# Patient Record
Sex: Female | Born: 1940 | Race: Black or African American | Hispanic: No | Smoking: Never smoker
Health system: Southern US, Community
[De-identification: ages and names within clinical notes are randomized; demographics above are authoritative.]

## PROBLEM LIST (undated history)

## (undated) DIAGNOSIS — E119 Type 2 diabetes mellitus without complications: Secondary | ICD-10-CM

## (undated) DIAGNOSIS — K5792 Diverticulitis of intestine, part unspecified, without perforation or abscess without bleeding: Secondary | ICD-10-CM

## (undated) DIAGNOSIS — I10 Essential (primary) hypertension: Secondary | ICD-10-CM

## (undated) DIAGNOSIS — M1990 Unspecified osteoarthritis, unspecified site: Secondary | ICD-10-CM

## (undated) DIAGNOSIS — F0390 Unspecified dementia without behavioral disturbance: Secondary | ICD-10-CM

## (undated) HISTORY — PX: COLON RESECTION: SHX5231

---

## 1999-03-10 ENCOUNTER — Encounter: Payer: Self-pay | Admitting: *Deleted

## 1999-03-10 ENCOUNTER — Inpatient Hospital Stay (HOSPITAL_COMMUNITY): Admission: EM | Admit: 1999-03-10 | Discharge: 1999-03-13 | Payer: Self-pay | Admitting: Emergency Medicine

## 1999-04-18 ENCOUNTER — Encounter: Payer: Self-pay | Admitting: *Deleted

## 1999-04-18 ENCOUNTER — Inpatient Hospital Stay (HOSPITAL_COMMUNITY): Admission: RE | Admit: 1999-04-18 | Discharge: 1999-04-20 | Payer: Self-pay | Admitting: *Deleted

## 1999-07-20 ENCOUNTER — Encounter: Payer: Self-pay | Admitting: *Deleted

## 1999-07-20 ENCOUNTER — Encounter: Payer: Self-pay | Admitting: Emergency Medicine

## 1999-07-20 ENCOUNTER — Inpatient Hospital Stay (HOSPITAL_COMMUNITY): Admission: EM | Admit: 1999-07-20 | Discharge: 1999-07-22 | Payer: Self-pay | Admitting: Emergency Medicine

## 1999-08-15 ENCOUNTER — Ambulatory Visit (HOSPITAL_COMMUNITY): Admission: RE | Admit: 1999-08-15 | Discharge: 1999-08-15 | Payer: Self-pay | Admitting: General Surgery

## 1999-08-15 ENCOUNTER — Encounter: Payer: Self-pay | Admitting: General Surgery

## 1999-08-22 ENCOUNTER — Encounter: Payer: Self-pay | Admitting: General Surgery

## 1999-08-25 ENCOUNTER — Inpatient Hospital Stay (HOSPITAL_COMMUNITY): Admission: RE | Admit: 1999-08-25 | Discharge: 1999-08-31 | Payer: Self-pay | Admitting: General Surgery

## 1999-08-25 ENCOUNTER — Encounter (INDEPENDENT_AMBULATORY_CARE_PROVIDER_SITE_OTHER): Payer: Self-pay | Admitting: Specialist

## 2000-02-16 ENCOUNTER — Other Ambulatory Visit: Admission: RE | Admit: 2000-02-16 | Discharge: 2000-02-16 | Payer: Self-pay | Admitting: Internal Medicine

## 2000-04-12 ENCOUNTER — Ambulatory Visit (HOSPITAL_COMMUNITY): Admission: RE | Admit: 2000-04-12 | Discharge: 2000-04-12 | Payer: Self-pay | Admitting: Ophthalmology

## 2000-07-24 ENCOUNTER — Encounter: Admission: RE | Admit: 2000-07-24 | Discharge: 2000-07-24 | Payer: Self-pay | Admitting: Internal Medicine

## 2000-07-24 ENCOUNTER — Encounter: Payer: Self-pay | Admitting: Internal Medicine

## 2000-08-20 ENCOUNTER — Encounter: Payer: Self-pay | Admitting: Internal Medicine

## 2000-08-20 ENCOUNTER — Ambulatory Visit (HOSPITAL_COMMUNITY): Admission: RE | Admit: 2000-08-20 | Discharge: 2000-08-20 | Payer: Self-pay | Admitting: Internal Medicine

## 2001-07-19 ENCOUNTER — Emergency Department (HOSPITAL_COMMUNITY): Admission: EM | Admit: 2001-07-19 | Discharge: 2001-07-20 | Payer: Self-pay | Admitting: Emergency Medicine

## 2005-04-26 ENCOUNTER — Emergency Department (HOSPITAL_COMMUNITY): Admission: EM | Admit: 2005-04-26 | Discharge: 2005-04-26 | Payer: Self-pay | Admitting: Emergency Medicine

## 2007-07-11 ENCOUNTER — Emergency Department (HOSPITAL_COMMUNITY): Admission: EM | Admit: 2007-07-11 | Discharge: 2007-07-11 | Payer: Self-pay | Admitting: Family Medicine

## 2009-11-29 ENCOUNTER — Encounter (INDEPENDENT_AMBULATORY_CARE_PROVIDER_SITE_OTHER): Payer: Self-pay | Admitting: *Deleted

## 2009-12-20 ENCOUNTER — Encounter (INDEPENDENT_AMBULATORY_CARE_PROVIDER_SITE_OTHER): Payer: Self-pay | Admitting: *Deleted

## 2009-12-21 ENCOUNTER — Encounter (INDEPENDENT_AMBULATORY_CARE_PROVIDER_SITE_OTHER): Payer: Self-pay | Admitting: *Deleted

## 2009-12-21 ENCOUNTER — Ambulatory Visit: Payer: Self-pay | Admitting: Internal Medicine

## 2010-01-04 ENCOUNTER — Ambulatory Visit: Payer: Self-pay | Admitting: Internal Medicine

## 2010-11-28 NOTE — Letter (Signed)
Summary: Previsit letter  [facility]  [address] [place], [place] [address]   Phone: [phone]  Fax: [phone]       11/29/2009 MRN: [id number]  Tammy Huff [address] [place], [place]  [address]  Dear [name],  Welcome to the Gastroenterology Division at [facility].    You are scheduled to see a nurse for your pre-procedure visit on 12-21-09 at 10:00a.m. on the 3rd floor at [facility], [address]  We ask that you try to arrive at our office 15 minutes prior to your appointment time to allow for check-in.  Your nurse visit will consist of discussing your medical and surgical history, your immediate family medical history, and your medications.    Please bring a complete list of all your medications or, if you prefer, bring the medication bottles and we will list them.  We will need to be aware of both prescribed and over the counter drugs.  We will need to know exact dosage information as well.  If you are on blood thinners (Coumadin, Plavix, Aggrenox, Ticlid, etc.) please call our office today/prior to your appointment, as we need to consult with your physician about holding your medication.   Please be prepared to read and sign documents such as consent forms, a financial agreement, and acknowledgement forms.  If necessary, and with your consent, a friend or relative is welcome to sit-in on the nurse visit with you.  Please bring your insurance card so that we may make a copy of it.  If your insurance requires a referral to see a specialist, please bring your referral form from your primary care physician.  No co-pay is required for this nurse visit.     If you cannot keep your appointment, please call [phone] to cancel or reschedule prior to your appointment date.  This allows [place] the opportunity to schedule an appointment for another patient in need of care.    Thank you for choosing [facility] for your medical  needs.  We appreciate the opportunity to care for you.  Please visit [place] at our website  to learn more about our practice.                     Sincerely.                                                                                                                   The Gastroenterology Division

## 2010-11-28 NOTE — Procedures (Signed)
Summary: Flexible Sigmoidoscopy  Patient: Tammy Huff Note: All result statuses are Final unless otherwise noted.  Tests: (1) Flexible Sigmoidoscopy (FLX)  FLX Flexible Sigmoidoscopy                             DONE     [facility]     [address]     [place], [place]  [address]           FLEXIBLE SIGMOIDOSCOPY PROCEDURE REPORT           PATIENT:  Tammy Huff, Tammy Huff  MR#:  [id number]     BIRTHDATE:  [birth year], 68 yrs. old  GENDER:  female           ENDOSCOPIST:  Iva Boop, MD, [facility]     Referred by:  Guerry Bruin, M.D.           PROCEDURE DATE:  01/04/2010     PROCEDURE:  Flexible Sigmoidoscopy, diagnostic     ASA CLASS:  Class II     INDICATIONS:  screening routine risk     (prior subtotal colecomy for diverticulitis)           MEDICATIONS:   Fentanyl 50 mcg IV, Versed 5 mg IV           DESCRIPTION OF PROCEDURE:   After the risks benefits and     alternatives of the procedure were thoroughly explained, informed     consent was obtained.  Digital rectal exam was performed and     revealed no abnormalities.   The LB [id number] endoscope     was introduced through the anus and advanced to the ileum, without     limitations.  The quality of the prep was excellent.  The     instrument was then slowly withdrawn as the mucosa was fully     examined.     <<PROCEDUREIMAGES>>           A postop change was noted. 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  A diverticulum was found in     the sigmoid colon.  The examination was otherwise normal in the     sigmoid colon.  Internal hemorrhoids were found in the rectum.     Retroflexed views in the rectum revealed small internal hemorrhoids     also.    The scope was then withdrawn from the patient and the     procedure terminated.           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 Could be     performed without sedation           Iva Boop, MD, [facility]           CC:  Rich Brave, MD     The Patient           n.     eSIGNED:   Iva Boop at 01/04/2010 09:20 AM  Adea, Ge[place], [phone]  Note: An exclamation mark (!) indicates a result that was not dispersed into the flowsheet. Document Creation Date: 01/04/2010 9:20 AM _______________________________________________________________________  (1) Order result status: Final Collection or observation date-time: 01/04/2010 09:06 Requested date-time:  Receipt date-time:  Reported date-time:  Referring Physician:   Ordering Physician: Stan Head [phone]) Specimen Source:  Source: Launa Grill Order Number: [id number] Lab site:   Appended Document: Flexible Sigmoidoscopy     Procedures Next Due Date:    Flexible Sigmoidoscopy: 12/2019

## 2010-11-28 NOTE — Letter (Signed)
Summary: Diabetic Instructions  [facility]  [address] [place], [place] [address]   Phone: [phone]  Fax: [phone]    Tammy Huff 1941/04/30 MRN: [id number]    ________________________________________________________________________  _ x _   INSULIN (LONG ACTING) MEDICATION INSTRUCTIONS (Lantus, NPH, 70/30, Humulin, Novolin-N)   The day before your procedure:   Take your regular morning dose    The day of your procedure:   Do not take your morning dose

## 2010-11-28 NOTE — Letter (Signed)
Summary: [facility] Instructions  [facility]  [address] [place], [place] [address]   Phone: [phone]  Fax: [phone]       Tammy Huff    02-Jul-1941    MRN: [id number]        Procedure Day Dorna Bloom:  Wednesday  01/04/10     Arrival Time: 7:30am     Procedure Time:  8:30am     Location of Procedure:                    _X _  [facility] (4th Floor)                        PREPARATION FOR COLONOSCOPY WITH MOVIPREP   Starting 5 days prior to your procedure   Friday 03/04  do not eat nuts, seeds, popcorn, corn, beans, peas,  salads, or any raw vegetables.  Do not take any fiber supplements (e.g. Metamucil, Citrucel, and Benefiber).  THE DAY BEFORE YOUR PROCEDURE         DATE:  03/08   DAY: Tuesday  1.  Drink clear liquids the entire day-NO SOLID FOOD  2.  Do not drink anything colored red or purple.  Avoid juices with pulp.  No orange juice.  3.  Drink at least 64 oz. (8 glasses) of fluid/clear liquids during the day to prevent dehydration and help the prep work efficiently.  CLEAR LIQUIDS INCLUDE: Water Jello Ice Popsicles Tea (sugar ok, no milk/cream) Powdered fruit flavored drinks Coffee (sugar ok, no milk/cream) Gatorade Juice: apple, white grape, white cranberry  Lemonade Clear bullion, consomm, broth Carbonated beverages (any kind) Strained chicken noodle soup Hard Candy                             4.  In the morning, mix first dose of MoviPrep solution:    Empty 1 Pouch A and 1 Pouch B into the disposable container    Add lukewarm drinking water to the top line of the container. Mix to dissolve    Refrigerate (mixed solution should be used within 24 hrs)  5.  Begin drinking the prep at 5:00 p.m. The MoviPrep container is divided by 4 marks.   Every 15 minutes drink the solution down to the next mark (approximately 8 oz) until the full liter is complete.   6.  Follow completed prep with 16 oz of clear liquid of your  choice (Nothing red or purple).  Continue to drink clear liquids until bedtime.  7.  Before going to bed, mix second dose of MoviPrep solution:    Empty 1 Pouch A and 1 Pouch B into the disposable container    Add lukewarm drinking water to the top line of the container. Mix to dissolve    Refrigerate  THE DAY OF YOUR PROCEDURE      DATE:  03/09  DAY: Wednesday  Beginning at  3:30 a.m. (5 hours before procedure):         1. Every 15 minutes, drink the solution down to the next mark (approx 8 oz) until the full liter is complete.  2. Follow completed prep with 16 oz. of clear liquid of your choice.    3. You may drink clear liquids until 6:30am  (2 HOURS BEFORE PROCEDURE).   MEDICATION INSTRUCTIONS  Unless otherwise instructed, you should take regular prescription medications with a small sip of  water   as early as possible the morning of your procedure.  Diabetic patients - see separate instructions.   Additional medication instructions: Take morning blood pressure medicine the day of procedure. Hold HCTZ and insulin the morning of procedure.         OTHER INSTRUCTIONS  You will need a responsible adult at least 72 years of age to accompany you and drive you home.   This person must remain in the waiting room during your procedure.  Wear loose fitting clothing that is easily removed.  Leave jewelry and other valuables at home.  However, you may wish to bring a book to read or  an iPod/MP3 player to listen to music as you wait for your procedure to start.  Remove all body piercing jewelry and leave at home.  Total time from sign-in until discharge is approximately 2-3 hours.  You should go home directly after your procedure and rest.  You can resume normal activities the  day after your procedure.  The day of your procedure you should not:   Drive   Make legal decisions   Operate machinery   Drink alcohol   Return to work  You will receive specific  instructions about eating, activities and medications before you leave.    The above instructions have been reviewed and explained to me by   Wyona Almas RN  December 21, 2009 10:15 AM     I fully understand and can verbalize these instructions _____________________________ Date _________

## 2010-11-28 NOTE — Miscellaneous (Signed)
Summary: [facility] Previsit/prep  Clinical Lists Changes  Medications: Added new medication of MOVIPREP 100 GM  SOLR (PEG-KCL-NACL-NASULF-NA ASC-C) As per prep instructions. - Signed Rx of MOVIPREP 100 GM  SOLR (PEG-KCL-NACL-NASULF-NA ASC-C) As per prep instructions.;  #1 x 0;  Signed;  Entered by: Wyona Almas RN;  Authorized by: Iva Boop MD, [facility];  Method used: Electronically to [facility] [phone]*, [address] [place], [place]  [address], Ph: [phone], Fax: [phone] Observations: Added new observation of NKA: T (12/21/2009 9:27)    Prescriptions: MOVIPREP 100 GM  SOLR (PEG-KCL-NACL-NASULF-NA ASC-C) As per prep instructions.  #1 x 0   Entered by:   Wyona Almas RN   Authorized by:   Iva Boop MD, [facility]   Signed by:   Wyona Almas RN on 12/21/2009   Method used:   Electronically to        [facility] [address] [phone]* (retail)       [address]       [address]       [place], [place]  [address]       Ph: [phone]       Fax: [phone]   RxID:   [id number]

## 2011-01-19 LAB — GLUCOSE, CAPILLARY
Glucose-Capillary: 144 mg/dL — ABNORMAL HIGH (ref 70–99)
Glucose-Capillary: 62 mg/dL — ABNORMAL LOW (ref 70–99)
Glucose-Capillary: 89 mg/dL (ref 70–99)

## 2011-01-22 ENCOUNTER — Emergency Department (HOSPITAL_COMMUNITY): Payer: Medicare (Managed Care)

## 2011-01-22 ENCOUNTER — Observation Stay (HOSPITAL_COMMUNITY)
Admission: EM | Admit: 2011-01-22 | Discharge: 2011-01-23 | Disposition: A | Discharge status: Home / Self Care | Payer: Medicare (Managed Care) | Source: Ambulatory Visit | Attending: Emergency Medicine | Admitting: Emergency Medicine

## 2011-01-22 DIAGNOSIS — I10 Essential (primary) hypertension: Secondary | ICD-10-CM | POA: Insufficient documentation

## 2011-01-22 DIAGNOSIS — R4182 Altered mental status, unspecified: Secondary | ICD-10-CM | POA: Insufficient documentation

## 2011-01-22 DIAGNOSIS — Z9181 History of falling: Secondary | ICD-10-CM | POA: Insufficient documentation

## 2011-01-22 DIAGNOSIS — E1169 Type 2 diabetes mellitus with other specified complication: Principal | ICD-10-CM | POA: Insufficient documentation

## 2011-01-22 LAB — COMPREHENSIVE METABOLIC PANEL
Alkaline Phosphatase: 101 U/L (ref 39–117)
BUN: 15 mg/dL (ref 6–23)
CO2: 28 mEq/L (ref 19–32)
Calcium: 9 mg/dL (ref 8.4–10.5)
Chloride: 106 mEq/L (ref 96–112)
GFR calc Af Amer: 35 mL/min — ABNORMAL LOW (ref >60)
GFR calc non Af Amer: 29 mL/min — ABNORMAL LOW (ref >60)
Glucose, Bld: 75 mg/dL (ref 70–99)
Total Protein: 8.3 g/dL (ref 6.0–8.3)

## 2011-01-22 LAB — POCT I-STAT, CHEM 8
Chloride: 105 mEq/L (ref 96–112)
HCT: 35 % — ABNORMAL LOW (ref 36.0–46.0)
Hemoglobin: 11.9 g/dL — ABNORMAL LOW (ref 12.0–15.0)
Sodium: 143 mEq/L (ref 135–145)

## 2011-01-22 LAB — URINALYSIS, ROUTINE W REFLEX MICROSCOPIC
Glucose, UA: NEGATIVE mg/dL
Hgb urine dipstick: NEGATIVE
Protein, ur: NEGATIVE mg/dL
Specific Gravity, Urine: 1.014 (ref 1.005–1.030)
Urobilinogen, UA: 0.2 mg/dL (ref 0.0–1.0)
pH: 5.5 (ref 5.0–8.0)

## 2011-01-22 LAB — CBC
Platelets: 282 10*3/uL (ref 150–400)
RDW: 14.8 % (ref 11.5–15.5)
WBC: 8.1 10*3/uL (ref 4.0–10.5)

## 2011-01-22 LAB — GLUCOSE, CAPILLARY: Glucose-Capillary: 138 mg/dL — ABNORMAL HIGH (ref 70–99)

## 2011-01-22 LAB — DIFFERENTIAL
Basophils Absolute: 0 10*3/uL (ref 0.0–0.1)
Basophils Relative: 1 % (ref 0–1)
Eosinophils Absolute: 0.1 10*3/uL (ref 0.0–0.7)
Lymphs Abs: 2.2 10*3/uL (ref 0.7–4.0)
Monocytes Absolute: 0.8 10*3/uL (ref 0.1–1.0)

## 2011-01-23 LAB — GLUCOSE, CAPILLARY: Glucose-Capillary: 127 mg/dL — ABNORMAL HIGH (ref 70–99)

## 2011-10-24 ENCOUNTER — Ambulatory Visit: Payer: Self-pay

## 2011-10-27 ENCOUNTER — Ambulatory Visit: Payer: Self-pay

## 2012-04-17 ENCOUNTER — Encounter (INDEPENDENT_AMBULATORY_CARE_PROVIDER_SITE_OTHER): Payer: Medicare Other | Admitting: Ophthalmology

## 2012-04-17 DIAGNOSIS — I10 Essential (primary) hypertension: Secondary | ICD-10-CM

## 2012-04-17 DIAGNOSIS — H3530 Unspecified macular degeneration: Secondary | ICD-10-CM

## 2012-04-17 DIAGNOSIS — E11359 Type 2 diabetes mellitus with proliferative diabetic retinopathy without macular edema: Secondary | ICD-10-CM

## 2012-04-17 DIAGNOSIS — E11319 Type 2 diabetes mellitus with unspecified diabetic retinopathy without macular edema: Secondary | ICD-10-CM

## 2012-04-17 DIAGNOSIS — E1165 Type 2 diabetes mellitus with hyperglycemia: Secondary | ICD-10-CM

## 2012-04-17 DIAGNOSIS — H43819 Vitreous degeneration, unspecified eye: Secondary | ICD-10-CM

## 2012-04-17 DIAGNOSIS — H35379 Puckering of macula, unspecified eye: Secondary | ICD-10-CM

## 2012-04-25 ENCOUNTER — Encounter (INDEPENDENT_AMBULATORY_CARE_PROVIDER_SITE_OTHER): Payer: Medicare Other | Admitting: Ophthalmology

## 2012-04-25 DIAGNOSIS — E1139 Type 2 diabetes mellitus with other diabetic ophthalmic complication: Secondary | ICD-10-CM

## 2012-04-25 DIAGNOSIS — E11359 Type 2 diabetes mellitus with proliferative diabetic retinopathy without macular edema: Secondary | ICD-10-CM

## 2012-08-25 ENCOUNTER — Ambulatory Visit (INDEPENDENT_AMBULATORY_CARE_PROVIDER_SITE_OTHER): Payer: Medicare Other | Admitting: Ophthalmology

## 2012-08-25 DIAGNOSIS — E1139 Type 2 diabetes mellitus with other diabetic ophthalmic complication: Secondary | ICD-10-CM

## 2012-08-25 DIAGNOSIS — I10 Essential (primary) hypertension: Secondary | ICD-10-CM

## 2012-08-25 DIAGNOSIS — H43819 Vitreous degeneration, unspecified eye: Secondary | ICD-10-CM

## 2012-08-25 DIAGNOSIS — H35379 Puckering of macula, unspecified eye: Secondary | ICD-10-CM

## 2012-08-25 DIAGNOSIS — E11359 Type 2 diabetes mellitus with proliferative diabetic retinopathy without macular edema: Secondary | ICD-10-CM

## 2012-08-25 DIAGNOSIS — H35039 Hypertensive retinopathy, unspecified eye: Secondary | ICD-10-CM

## 2013-02-23 ENCOUNTER — Ambulatory Visit (INDEPENDENT_AMBULATORY_CARE_PROVIDER_SITE_OTHER): Payer: Medicare HMO | Admitting: Ophthalmology

## 2013-02-23 DIAGNOSIS — H35039 Hypertensive retinopathy, unspecified eye: Secondary | ICD-10-CM

## 2013-02-23 DIAGNOSIS — H43819 Vitreous degeneration, unspecified eye: Secondary | ICD-10-CM

## 2013-02-23 DIAGNOSIS — E11359 Type 2 diabetes mellitus with proliferative diabetic retinopathy without macular edema: Secondary | ICD-10-CM

## 2013-02-23 DIAGNOSIS — I10 Essential (primary) hypertension: Secondary | ICD-10-CM

## 2013-02-23 DIAGNOSIS — E1165 Type 2 diabetes mellitus with hyperglycemia: Secondary | ICD-10-CM

## 2013-03-06 ENCOUNTER — Encounter (INDEPENDENT_AMBULATORY_CARE_PROVIDER_SITE_OTHER): Payer: Medicare HMO | Admitting: Ophthalmology

## 2013-03-06 DIAGNOSIS — E1139 Type 2 diabetes mellitus with other diabetic ophthalmic complication: Secondary | ICD-10-CM

## 2013-03-06 DIAGNOSIS — E11359 Type 2 diabetes mellitus with proliferative diabetic retinopathy without macular edema: Secondary | ICD-10-CM

## 2013-07-08 ENCOUNTER — Ambulatory Visit (INDEPENDENT_AMBULATORY_CARE_PROVIDER_SITE_OTHER): Payer: Medicare HMO | Admitting: Ophthalmology

## 2013-07-08 DIAGNOSIS — H43819 Vitreous degeneration, unspecified eye: Secondary | ICD-10-CM

## 2013-07-08 DIAGNOSIS — E11359 Type 2 diabetes mellitus with proliferative diabetic retinopathy without macular edema: Secondary | ICD-10-CM

## 2013-07-08 DIAGNOSIS — E1139 Type 2 diabetes mellitus with other diabetic ophthalmic complication: Secondary | ICD-10-CM

## 2013-07-08 DIAGNOSIS — H35039 Hypertensive retinopathy, unspecified eye: Secondary | ICD-10-CM

## 2013-07-08 DIAGNOSIS — I10 Essential (primary) hypertension: Secondary | ICD-10-CM

## 2013-07-08 DIAGNOSIS — H2700 Aphakia, unspecified eye: Secondary | ICD-10-CM

## 2014-04-12 ENCOUNTER — Ambulatory Visit (INDEPENDENT_AMBULATORY_CARE_PROVIDER_SITE_OTHER): Payer: Medicare HMO | Admitting: Ophthalmology

## 2014-06-22 ENCOUNTER — Ambulatory Visit (INDEPENDENT_AMBULATORY_CARE_PROVIDER_SITE_OTHER): Payer: Medicare HMO | Admitting: Ophthalmology

## 2014-08-11 DIAGNOSIS — N189 Chronic kidney disease, unspecified: Secondary | ICD-10-CM | POA: Insufficient documentation

## 2014-08-11 DIAGNOSIS — E1122 Type 2 diabetes mellitus with diabetic chronic kidney disease: Secondary | ICD-10-CM | POA: Diagnosis not present

## 2014-08-11 DIAGNOSIS — R42 Dizziness and giddiness: Secondary | ICD-10-CM | POA: Diagnosis not present

## 2014-08-11 DIAGNOSIS — Z79899 Other long term (current) drug therapy: Secondary | ICD-10-CM | POA: Insufficient documentation

## 2014-08-11 DIAGNOSIS — M1990 Unspecified osteoarthritis, unspecified site: Secondary | ICD-10-CM | POA: Insufficient documentation

## 2014-08-11 DIAGNOSIS — E785 Hyperlipidemia, unspecified: Secondary | ICD-10-CM | POA: Diagnosis not present

## 2014-08-11 DIAGNOSIS — I129 Hypertensive chronic kidney disease with stage 1 through stage 4 chronic kidney disease, or unspecified chronic kidney disease: Secondary | ICD-10-CM | POA: Insufficient documentation

## 2014-08-11 DIAGNOSIS — K5792 Diverticulitis of intestine, part unspecified, without perforation or abscess without bleeding: Secondary | ICD-10-CM | POA: Insufficient documentation

## 2014-08-12 ENCOUNTER — Emergency Department (HOSPITAL_COMMUNITY): Payer: Medicare HMO

## 2014-08-12 ENCOUNTER — Observation Stay (HOSPITAL_COMMUNITY)
Admission: EM | Admit: 2014-08-12 | Discharge: 2014-08-12 | Disposition: A | Discharge status: Home / Self Care | Payer: Medicare HMO | Attending: Internal Medicine | Admitting: Internal Medicine

## 2014-08-12 ENCOUNTER — Encounter (HOSPITAL_COMMUNITY): Payer: Self-pay | Admitting: Emergency Medicine

## 2014-08-12 ENCOUNTER — Observation Stay (HOSPITAL_COMMUNITY): Payer: Medicare HMO

## 2014-08-12 DIAGNOSIS — M1990 Unspecified osteoarthritis, unspecified site: Secondary | ICD-10-CM | POA: Diagnosis not present

## 2014-08-12 DIAGNOSIS — Z79899 Other long term (current) drug therapy: Secondary | ICD-10-CM | POA: Diagnosis not present

## 2014-08-12 DIAGNOSIS — E1122 Type 2 diabetes mellitus with diabetic chronic kidney disease: Secondary | ICD-10-CM

## 2014-08-12 DIAGNOSIS — R42 Dizziness and giddiness: Secondary | ICD-10-CM | POA: Diagnosis present

## 2014-08-12 DIAGNOSIS — I369 Nonrheumatic tricuspid valve disorder, unspecified: Secondary | ICD-10-CM

## 2014-08-12 DIAGNOSIS — N189 Chronic kidney disease, unspecified: Secondary | ICD-10-CM | POA: Diagnosis not present

## 2014-08-12 DIAGNOSIS — E785 Hyperlipidemia, unspecified: Secondary | ICD-10-CM | POA: Diagnosis not present

## 2014-08-12 DIAGNOSIS — G458 Other transient cerebral ischemic attacks and related syndromes: Secondary | ICD-10-CM

## 2014-08-12 DIAGNOSIS — K5792 Diverticulitis of intestine, part unspecified, without perforation or abscess without bleeding: Secondary | ICD-10-CM | POA: Diagnosis not present

## 2014-08-12 DIAGNOSIS — E119 Type 2 diabetes mellitus without complications: Secondary | ICD-10-CM

## 2014-08-12 DIAGNOSIS — I129 Hypertensive chronic kidney disease with stage 1 through stage 4 chronic kidney disease, or unspecified chronic kidney disease: Secondary | ICD-10-CM | POA: Diagnosis not present

## 2014-08-12 DIAGNOSIS — I10 Essential (primary) hypertension: Secondary | ICD-10-CM | POA: Diagnosis present

## 2014-08-12 HISTORY — DX: Unspecified osteoarthritis, unspecified site: M19.90

## 2014-08-12 HISTORY — DX: Essential (primary) hypertension: I10

## 2014-08-12 HISTORY — DX: Type 2 diabetes mellitus without complications: E11.9

## 2014-08-12 HISTORY — DX: Diverticulitis of intestine, part unspecified, without perforation or abscess without bleeding: K57.92

## 2014-08-12 LAB — DIFFERENTIAL
BASOS ABS: 0 10*3/uL (ref 0.0–0.1)
Basophils Relative: 0 % (ref 0–1)
EOS ABS: 0.2 10*3/uL (ref 0.0–0.7)
Eosinophils Relative: 2 % (ref 0–5)
LYMPHS ABS: 2.7 10*3/uL (ref 0.7–4.0)
LYMPHS PCT: 36 % (ref 12–46)
Monocytes Absolute: 0.5 10*3/uL (ref 0.1–1.0)
Monocytes Relative: 6 % (ref 3–12)
NEUTROS PCT: 56 % (ref 43–77)
Neutro Abs: 4.3 10*3/uL (ref 1.7–7.7)

## 2014-08-12 LAB — CBC
HCT: 32.2 % — ABNORMAL LOW (ref 36.0–46.0)
Hemoglobin: 11.1 g/dL — ABNORMAL LOW (ref 12.0–15.0)
MCH: 29.9 pg (ref 26.0–34.0)
MCHC: 34.5 g/dL (ref 30.0–36.0)
MCV: 86.8 fL (ref 78.0–100.0)
PLATELETS: 300 10*3/uL (ref 150–400)
RBC: 3.71 MIL/uL — ABNORMAL LOW (ref 3.87–5.11)
RDW: 13.6 % (ref 11.5–15.5)
WBC: 7.7 10*3/uL (ref 4.0–10.5)

## 2014-08-12 LAB — RAPID URINE DRUG SCREEN, HOSP PERFORMED
Amphetamines: NOT DETECTED
BARBITURATES: NOT DETECTED
BENZODIAZEPINES: NOT DETECTED
Cocaine: NOT DETECTED
Opiates: NOT DETECTED
TETRAHYDROCANNABINOL: NOT DETECTED

## 2014-08-12 LAB — COMPREHENSIVE METABOLIC PANEL
ALK PHOS: 165 U/L — AB (ref 39–117)
ALT: 13 U/L (ref 0–35)
AST: 22 U/L (ref 0–37)
Albumin: 3.3 g/dL — ABNORMAL LOW (ref 3.5–5.2)
Anion gap: 12 (ref 5–15)
BUN: 22 mg/dL (ref 6–23)
CO2: 28 mEq/L (ref 19–32)
Calcium: 9 mg/dL (ref 8.4–10.5)
Chloride: 98 mEq/L (ref 96–112)
Creatinine, Ser: 1.81 mg/dL — ABNORMAL HIGH (ref 0.50–1.10)
GFR calc non Af Amer: 26 mL/min — ABNORMAL LOW (ref >90)
GFR, EST AFRICAN AMERICAN: 30 mL/min — AB (ref >90)
GLUCOSE: 390 mg/dL — AB (ref 70–99)
POTASSIUM: 4.2 meq/L (ref 3.7–5.3)
SODIUM: 138 meq/L (ref 137–147)
Total Bilirubin: 0.3 mg/dL (ref 0.3–1.2)
Total Protein: 7.8 g/dL (ref 6.0–8.3)

## 2014-08-12 LAB — PROTIME-INR
INR: 1.03 (ref 0.00–1.49)
PROTHROMBIN TIME: 13.6 s (ref 11.6–15.2)

## 2014-08-12 LAB — URINALYSIS, ROUTINE W REFLEX MICROSCOPIC
BILIRUBIN URINE: NEGATIVE
Glucose, UA: >1000 mg/dL — AB
Hgb urine dipstick: NEGATIVE
KETONES UR: NEGATIVE mg/dL
LEUKOCYTES UA: NEGATIVE
Nitrite: NEGATIVE
PROTEIN: NEGATIVE mg/dL
Specific Gravity, Urine: 1.016 (ref 1.005–1.030)
UROBILINOGEN UA: 0.2 mg/dL (ref 0.0–1.0)
pH: 5.5 (ref 5.0–8.0)

## 2014-08-12 LAB — GLUCOSE, CAPILLARY
Glucose-Capillary: 304 mg/dL — ABNORMAL HIGH (ref 70–99)
Glucose-Capillary: 124 mg/dL — ABNORMAL HIGH (ref 70–99)
Glucose-Capillary: 255 mg/dL — ABNORMAL HIGH (ref 70–99)

## 2014-08-12 LAB — I-STAT CHEM 8, ED
BUN: 29 mg/dL — ABNORMAL HIGH (ref 6–23)
CHLORIDE: 101 meq/L (ref 96–112)
CREATININE: 1.8 mg/dL — AB (ref 0.50–1.10)
Calcium, Ion: 1.11 mmol/L — ABNORMAL LOW (ref 1.13–1.30)
Glucose, Bld: 373 mg/dL — ABNORMAL HIGH (ref 70–99)
HCT: 37 % (ref 36.0–46.0)
Hemoglobin: 12.6 g/dL (ref 12.0–15.0)
POTASSIUM: 4.3 meq/L (ref 3.7–5.3)
Sodium: 138 mEq/L (ref 137–147)
TCO2: 31 mmol/L (ref 0–100)

## 2014-08-12 LAB — URINE MICROSCOPIC-ADD ON

## 2014-08-12 LAB — ETHANOL: Alcohol, Ethyl (B): <11 mg/dL (ref 0–11)

## 2014-08-12 LAB — APTT: aPTT: 32 seconds (ref 24–37)

## 2014-08-12 LAB — I-STAT TROPONIN, ED: Troponin i, poc: 0.01 ng/mL (ref 0.00–0.08)

## 2014-08-12 LAB — HEMOGLOBIN A1C
Hgb A1c MFr Bld: 10.5 % — ABNORMAL HIGH (ref <5.7)
Mean Plasma Glucose: 255 mg/dL — ABNORMAL HIGH (ref <117)

## 2014-08-12 MED ORDER — ASPIRIN EC 325 MG PO TBEC
325.0000 mg | DELAYED_RELEASE_TABLET | Freq: Every day | ORAL | Status: DC
  Administered 2014-08-12: 325 mg via ORAL
  Filled 2014-08-12: qty 1

## 2014-08-12 MED ORDER — LOSARTAN POTASSIUM-HCTZ 100-25 MG PO TABS: 1.0000 | ORAL_TABLET | ORAL | Status: DC

## 2014-08-12 MED ORDER — LOSARTAN POTASSIUM 50 MG PO TABS
100.0000 mg | ORAL_TABLET | Freq: Every day | ORAL | Status: DC
  Administered 2014-08-12: 100 mg via ORAL
  Filled 2014-08-12 (×2): qty 2

## 2014-08-12 MED ORDER — SCOPOLAMINE 1 MG/3DAYS TD PT72: 1.0000 | MEDICATED_PATCH | TRANSDERMAL | Status: DC

## 2014-08-12 MED ORDER — INSULIN ASPART 100 UNIT/ML ~~LOC~~ SOLN
0.0000 [IU] | Freq: Three times a day (TID) | SUBCUTANEOUS | Status: DC
  Administered 2014-08-12: 8 [IU] via SUBCUTANEOUS
  Administered 2014-08-12: 11 [IU] via SUBCUTANEOUS

## 2014-08-12 MED ORDER — HYDROCHLOROTHIAZIDE 25 MG PO TABS
25.0000 mg | ORAL_TABLET | Freq: Every day | ORAL | Status: DC
  Administered 2014-08-12: 25 mg via ORAL
  Filled 2014-08-12 (×2): qty 1

## 2014-08-12 MED ORDER — MECLIZINE HCL 25 MG PO TABS: 25.0000 mg | ORAL_TABLET | Freq: Three times a day (TID) | ORAL | Status: DC | PRN

## 2014-08-12 MED ORDER — HEPARIN SODIUM (PORCINE) 5000 UNIT/ML IJ SOLN
5000.0000 [IU] | Freq: Three times a day (TID) | INTRAMUSCULAR | Status: DC
  Filled 2014-08-12 (×3): qty 1

## 2014-08-12 MED ORDER — DIPHENHYDRAMINE HCL 25 MG PO CAPS
25.0000 mg | ORAL_CAPSULE | Freq: Every day | ORAL | Status: DC
  Administered 2014-08-12: 25 mg via ORAL
  Filled 2014-08-12: qty 1

## 2014-08-12 MED ORDER — SCOPOLAMINE 1 MG/3DAYS TD PT72
1.0000 | MEDICATED_PATCH | TRANSDERMAL | Status: DC
  Administered 2014-08-12: 1.5 mg via TRANSDERMAL
  Filled 2014-08-12: qty 1

## 2014-08-12 MED ORDER — AMLODIPINE BESYLATE 10 MG PO TABS
10.0000 mg | ORAL_TABLET | Freq: Every evening | ORAL | Status: DC
  Administered 2014-08-12: 10 mg via ORAL
  Filled 2014-08-12: qty 1

## 2014-08-12 MED ORDER — MECLIZINE HCL 25 MG PO TABS
25.0000 mg | ORAL_TABLET | Freq: Once | ORAL | Status: AC
  Administered 2014-08-12: 25 mg via ORAL
  Filled 2014-08-12: qty 1

## 2014-08-12 MED ORDER — CLONIDINE HCL 0.2 MG PO TABS
0.2000 mg | ORAL_TABLET | Freq: Two times a day (BID) | ORAL | Status: DC
  Administered 2014-08-12 (×2): 0.2 mg via ORAL
  Filled 2014-08-12 (×4): qty 1

## 2014-08-12 MED ORDER — FAMOTIDINE 10 MG PO TABS
10.0000 mg | ORAL_TABLET | Freq: Every day | ORAL | Status: DC
  Filled 2014-08-12: qty 1

## 2014-08-12 MED ORDER — MECLIZINE HCL 25 MG PO TABS
25.0000 mg | ORAL_TABLET | Freq: Three times a day (TID) | ORAL | Status: DC | PRN
  Administered 2014-08-12: 25 mg via ORAL
  Filled 2014-08-12 (×2): qty 1

## 2014-08-12 MED ORDER — SODIUM CHLORIDE 0.9 % IJ SOLN: 3.0000 mL | Freq: Two times a day (BID) | INTRAMUSCULAR | Status: DC

## 2014-08-12 MED ORDER — CARVEDILOL 6.25 MG PO TABS
6.2500 mg | ORAL_TABLET | Freq: Two times a day (BID) | ORAL | Status: DC
  Administered 2014-08-12 (×2): 6.25 mg via ORAL
  Filled 2014-08-12 (×4): qty 1

## 2014-08-12 MED ORDER — INSULIN GLARGINE 100 UNIT/ML ~~LOC~~ SOLN
30.0000 [IU] | Freq: Every day | SUBCUTANEOUS | Status: DC
  Administered 2014-08-12: 30 [IU] via SUBCUTANEOUS
  Filled 2014-08-12: qty 0.3

## 2014-08-12 MED ORDER — ASPIRIN 325 MG PO TBEC: 325.0000 mg | DELAYED_RELEASE_TABLET | Freq: Every day | ORAL | Status: AC

## 2014-08-12 MED ORDER — PRAVASTATIN SODIUM 80 MG PO TABS
80.0000 mg | ORAL_TABLET | Freq: Every day | ORAL | Status: DC
  Administered 2014-08-12: 80 mg via ORAL
  Filled 2014-08-12: qty 1

## 2014-08-12 MED ORDER — INSULIN ASPART 100 UNIT/ML ~~LOC~~ SOLN: 4.0000 [IU] | Freq: Three times a day (TID) | SUBCUTANEOUS | Status: DC

## 2014-08-12 NOTE — Discharge Summary (Addendum)
Physician Discharge Summary  BAUDELIA SCHROEPFER MRN: [id number] DOB/AGE: 75-05-1941 75 y.o.  PCP: Lynne Logan, MD   Admit date: 08/12/2014 Discharge date: 08/12/2014  Discharge Diagnoses:      Vertigo   DM2 (diabetes mellitus, type 2)   HTN (hypertension)   HLD (hyperlipidemia)  Follow up recommendations Follow up with PCP in 5-7 days Followup with [facility] because of decreased hearing in her right ear Followup BMP, CBC in one week    Medication List         amLODipine 10 MG tablet  Commonly known as:  NORVASC  Take 10 mg by mouth every evening.     aspirin 325 MG EC tablet  Take 1 tablet (325 mg total) by mouth daily.     carvedilol 6.25 MG tablet  Commonly known as:  COREG  Take 6.25 mg by mouth 2 (two) times daily with a meal.     cloNIDine 0.2 MG tablet  Commonly known as:  CATAPRES  Take 0.2 mg by mouth 2 (two) times daily.     diphenhydrAMINE 25 MG tablet  Commonly known as:  BENADRYL  Take 25 mg by mouth every morning.     famotidine 10 MG chewable tablet  Commonly known as:  PEPCID AC  Chew 10 mg by mouth every morning.     GERITOL PO  Take 1 tablet by mouth every morning.     insulin aspart 100 UNIT/ML injection  Commonly known as:  novoLOG  Inject 4 Units into the skin 3 (three) times daily with meals.     LANTUS SOLOSTAR 100 UNIT/ML Solostar Pen  Generic drug:  Insulin Glargine  Inject 30 Units into the skin every morning.     losartan-hydrochlorothiazide 100-25 MG per tablet  Commonly known as:  HYZAAR  Take 1 tablet by mouth every morning.     pravastatin 80 MG tablet  Commonly known as:  PRAVACHOL  Take 80 mg by mouth every morning.     scopolamine 1 MG/3DAYS  Commonly known as:  TRANSDERM-SCOP  Place 1 patch (1.5 mg total) onto the skin every 3 (three) days.         Discharge Condition: Stable  Disposition: 01-Home or Self Care   Consults: Neurology  Significant Diagnostic Studies: Dg Chest 2  View  08/12/2014   CLINICAL DATA:  Weakness, shortness of breath, dizziness. Initial encounter.  EXAM: CHEST  2 VIEW  COMPARISON:  04/26/2005  FINDINGS: Grossly unchanged cardiac silhouette and mediastinal contours with tortuosity of the thoracic aorta. There is mild interstitial thickening with perihilar predominance. No discrete focal airspace opacities. No pleural effusion pneumothorax. No evidence of edema. Stigmata of dish within the thoracic spine.  IMPRESSION: Findings suggestive of airways disease. No focal airspace opacities to suggest pneumonia.   Electronically Signed   By: Sandi Mariscal M.D.   On: 08/12/2014 01:31   Ct Head Wo Contrast  08/12/2014   CLINICAL DATA:  Dizziness and unsteady gait.  Initial encounter.  EXAM: CT HEAD WITHOUT CONTRAST  TECHNIQUE: Contiguous axial images were obtained from the base of the skull through the vertex without intravenous contrast.  COMPARISON:  01/22/2011  FINDINGS: Skull and Sinuses:Negative for fracture or destructive process. There is mucosal thickening within the left maxillary sinus, mainly inferiorly, similar to 2012. No sinus effusion.  Orbits: Bilateral cataract resection.  No acute findings.  Brain: No evidence of acute abnormality, such as acute infarction, hemorrhage, hydrocephalus, or mass lesion/mass effect. Cerebral volume is normal  for age.  IMPRESSION: Negative head CT.   Electronically Signed   By: Jorje Guild M.D.   On: 08/12/2014 03:07   Mr Jodene Nam Head Wo Contrast  08/12/2014   CLINICAL DATA:  Two day history of worsening dizziness and vertigo. Left hand numbness and slurred speech has resolved. Gait disturbance.  EXAM: MRI HEAD WITHOUT CONTRAST  MRA HEAD WITHOUT CONTRAST  TECHNIQUE: Multiplanar, multiecho pulse sequences of the brain and surrounding structures were obtained without intravenous contrast. Angiographic images of the head were obtained using MRA technique without contrast.  COMPARISON:  Head CT 08/12/2014  FINDINGS: MRI HEAD  FINDINGS  Diffusion imaging does not show any acute or subacute infarction. There are mild chronic small-vessel ischemic changes of the pons. No focal cerebellar insult. The cerebral hemispheres show mild chronic small-vessel ischemic changes throughout the white matter, fairly typical for age. No cortical or large vessel territory infarction. No mass lesion, hemorrhage, hydrocephalus or extra-axial collection. No pituitary mass. There is mucosal thickening affecting the left maxillary sinus. There is some fluid at the mastoid tip air cells on the right.  MRA HEAD FINDINGS  Both internal carotid arteries are widely patent into the brain. No siphon stenosis. The anterior and middle cerebral vessels are patent without proximal stenosis, aneurysm or vascular malformation. Both vertebral arteries are widely patent to the basilar. No basilar stenosis. Posterior circulation branch vessels are normal.  IMPRESSION: No acute intracranial insult. Mild chronic small-vessel change of the pons and hemispheric white matter, fairly typical for age.  Normal intracranial MR angiography of the large and medium size vessels.   Electronically Signed   By: Nelson Chimes M.D.   On: 08/12/2014 10:10   Mr Brain Wo Contrast  08/12/2014   CLINICAL DATA:  Two day history of worsening dizziness and vertigo. Left hand numbness and slurred speech has resolved. Gait disturbance.  EXAM: MRI HEAD WITHOUT CONTRAST  MRA HEAD WITHOUT CONTRAST  TECHNIQUE: Multiplanar, multiecho pulse sequences of the brain and surrounding structures were obtained without intravenous contrast. Angiographic images of the head were obtained using MRA technique without contrast.  COMPARISON:  Head CT 08/12/2014  FINDINGS: MRI HEAD FINDINGS  Diffusion imaging does not show any acute or subacute infarction. There are mild chronic small-vessel ischemic changes of the pons. No focal cerebellar insult. The cerebral hemispheres show mild chronic small-vessel ischemic changes  throughout the white matter, fairly typical for age. No cortical or large vessel territory infarction. No mass lesion, hemorrhage, hydrocephalus or extra-axial collection. No pituitary mass. There is mucosal thickening affecting the left maxillary sinus. There is some fluid at the mastoid tip air cells on the right.  MRA HEAD FINDINGS  Both internal carotid arteries are widely patent into the brain. No siphon stenosis. The anterior and middle cerebral vessels are patent without proximal stenosis, aneurysm or vascular malformation. Both vertebral arteries are widely patent to the basilar. No basilar stenosis. Posterior circulation branch vessels are normal.  IMPRESSION: No acute intracranial insult. Mild chronic small-vessel change of the pons and hemispheric white matter, fairly typical for age.  Normal intracranial MR angiography of the large and medium size vessels.   Electronically Signed   By: Nelson Chimes M.D.   On: 08/12/2014 10:10      Microbiology: No results found for this or any previous visit (from the past 240 hour(s)).   Labs: Results for orders placed during the hospital encounter of 08/12/14 (from the past 48 hour(s))  ETHANOL     Status: None  Collection Time    08/12/14 12:25 AM      Result Value Ref Range   Alcohol, Ethyl (B) <11  0 - 11 mg/dL   Comment:            LOWEST DETECTABLE LIMIT FOR     SERUM ALCOHOL IS 11 mg/dL     FOR MEDICAL PURPOSES ONLY  PROTIME-INR     Status: None   Collection Time    08/12/14 12:25 AM      Result Value Ref Range   Prothrombin Time 13.6  11.6 - 15.2 seconds   INR 1.03  0.00 - 1.49  APTT     Status: None   Collection Time    08/12/14 12:25 AM      Result Value Ref Range   aPTT 32  24 - 37 seconds  CBC     Status: Abnormal   Collection Time    08/12/14 12:25 AM      Result Value Ref Range   WBC 7.7  4.0 - 10.5 K/uL   RBC 3.71 (*) 3.87 - 5.11 MIL/uL   Hemoglobin 11.1 (*) 12.0 - 15.0 g/dL   HCT 32.2 (*) 36.0 - 46.0 %   MCV 86.8   78.0 - 100.0 fL   MCH 29.9  26.0 - 34.0 pg   MCHC 34.5  30.0 - 36.0 g/dL   RDW 13.6  11.5 - 15.5 %   Platelets 300  150 - 400 K/uL  DIFFERENTIAL     Status: None   Collection Time    08/12/14 12:25 AM      Result Value Ref Range   Neutrophils Relative % 56  43 - 77 %   Neutro Abs 4.3  1.7 - 7.7 K/uL   Lymphocytes Relative 36  12 - 46 %   Lymphs Abs 2.7  0.7 - 4.0 K/uL   Monocytes Relative 6  3 - 12 %   Monocytes Absolute 0.5  0.1 - 1.0 K/uL   Eosinophils Relative 2  0 - 5 %   Eosinophils Absolute 0.2  0.0 - 0.7 K/uL   Basophils Relative 0  0 - 1 %   Basophils Absolute 0.0  0.0 - 0.1 K/uL  COMPREHENSIVE METABOLIC PANEL     Status: Abnormal   Collection Time    08/12/14 12:25 AM      Result Value Ref Range   Sodium 138  137 - 147 mEq/L   Potassium 4.2  3.7 - 5.3 mEq/L   Chloride 98  96 - 112 mEq/L   CO2 28  19 - 32 mEq/L   Glucose, Bld 390 (*) 70 - 99 mg/dL   BUN 22  6 - 23 mg/dL   Creatinine, Ser 1.81 (*) 0.50 - 1.10 mg/dL   Calcium 9.0  8.4 - 10.5 mg/dL   Total Protein 7.8  6.0 - 8.3 g/dL   Albumin 3.3 (*) 3.5 - 5.2 g/dL   AST 22  0 - 37 U/L   ALT 13  0 - 35 U/L   Alkaline Phosphatase 165 (*) 39 - 117 U/L   Total Bilirubin 0.3  0.3 - 1.2 mg/dL   GFR calc non Af Amer 26 (*) >90 mL/min   GFR calc Af Amer 30 (*) >90 mL/min   Comment: (NOTE)     The eGFR has been calculated using the CKD EPI equation.     This calculation has not been validated in all clinical situations.     eGFR's persistently <  90 mL/min signify possible Chronic Kidney     Disease.   Anion gap 12  5 - 15  I-STAT TROPOININ, ED     Status: None   Collection Time    08/12/14 12:41 AM      Result Value Ref Range   Troponin i, poc 0.01  0.00 - 0.08 ng/mL   Comment 3            Comment: Due to the release kinetics of cTnI,     a negative result within the first hours     of the onset of symptoms does not rule out     myocardial infarction with certainty.     If myocardial infarction is still suspected,      repeat the test at appropriate intervals.  I-STAT CHEM 8, ED     Status: Abnormal   Collection Time    08/12/14 12:43 AM      Result Value Ref Range   Sodium 138  137 - 147 mEq/L   Potassium 4.3  3.7 - 5.3 mEq/L   Chloride 101  96 - 112 mEq/L   BUN 29 (*) 6 - 23 mg/dL   Creatinine, Ser 1.80 (*) 0.50 - 1.10 mg/dL   Glucose, Bld 373 (*) 70 - 99 mg/dL   Calcium, Ion 1.11 (*) 1.13 - 1.30 mmol/L   TCO2 31  0 - 100 mmol/L   Hemoglobin 12.6  12.0 - 15.0 g/dL   HCT 37.0  36.0 - 46.0 %  URINE RAPID DRUG SCREEN (HOSP PERFORMED)     Status: None   Collection Time    08/12/14  1:35 AM      Result Value Ref Range   Opiates NONE DETECTED  NONE DETECTED   Cocaine NONE DETECTED  NONE DETECTED   Benzodiazepines NONE DETECTED  NONE DETECTED   Amphetamines NONE DETECTED  NONE DETECTED   Tetrahydrocannabinol NONE DETECTED  NONE DETECTED   Barbiturates NONE DETECTED  NONE DETECTED   Comment:            DRUG SCREEN FOR MEDICAL PURPOSES     ONLY.  IF CONFIRMATION IS NEEDED     FOR ANY PURPOSE, NOTIFY LAB     WITHIN 5 DAYS.                LOWEST DETECTABLE LIMITS     FOR URINE DRUG SCREEN     Drug Class       Cutoff (ng/mL)     Amphetamine      1000     Barbiturate      200     Benzodiazepine   480     Tricyclics       165     Opiates          300     Cocaine          300     THC              50  URINALYSIS, ROUTINE W REFLEX MICROSCOPIC     Status: Abnormal   Collection Time    08/12/14  1:35 AM      Result Value Ref Range   Color, Urine YELLOW  YELLOW   APPearance CLEAR  CLEAR   Specific Gravity, Urine 1.016  1.005 - 1.030   pH 5.5  5.0 - 8.0   Glucose, UA >1000 (*) NEGATIVE mg/dL   Hgb urine dipstick NEGATIVE  NEGATIVE   Bilirubin Urine NEGATIVE  NEGATIVE   Ketones, ur NEGATIVE  NEGATIVE mg/dL   Protein, ur NEGATIVE  NEGATIVE mg/dL   Urobilinogen, UA 0.2  0.0 - 1.0 mg/dL   Nitrite NEGATIVE  NEGATIVE   Leukocytes, UA NEGATIVE  NEGATIVE  URINE MICROSCOPIC-ADD ON     Status:  Abnormal   Collection Time    08/12/14  1:35 AM      Result Value Ref Range   Squamous Epithelial / LPF FEW (*) RARE   Bacteria, UA RARE  RARE  GLUCOSE, CAPILLARY     Status: Abnormal   Collection Time    08/12/14  8:01 AM      Result Value Ref Range   Glucose-Capillary 304 (*) 70 - 99 mg/dL   Comment 1 Documented in Chart     Comment 2 Notify RN    GLUCOSE, CAPILLARY     Status: Abnormal   Collection Time    08/12/14 11:29 AM      Result Value Ref Range   Glucose-Capillary 124 (*) 70 - 99 mg/dL   Comment 1 Documented in Chart     Comment 2 Notify RN       HPI : Tammy Huff is an 73 y.o. female female with past medical history of hypertension, diabetes, TIA coming in with intermittent dizziness which per her description appears to be vertigo. Symptoms ongoing for the past 2 days, has gotten progressively worse this morning and in she states she is still dizzy currently in the room. She had associated left hand numbness which has resolved. She states her symptoms are made worse with neck extension. Not triggered by lateral rotation of head. Per the daughter in the room she also had slurred speech. This was intermittent for the past 2 days, daughter feels speech is still slightly slurred but has improved. She states she feels wobbly and unsteady on her feet. She denies this occurring in the past. She denies an infectious history such as fevers, cough, or changes in her bowel or bladder. She does note a sensation of fullness and decreased hearing in her right ear.     HOSPITAL COURSE:   1. Vertigo, central versus peripheral, - probably vestibular , negative MRI/MRA brain in AM. With multiple history of TIA in the past, patient to have a 2-D echo and a carotid Doppler prior to discharge. She is to continue with aspirin 325 mg by mouth daily. Hemoglobin A1c pending. PCP to check a lipid panel. Patient already on a statin. PT eval pending to assess for vestibular rehabilitation. Patient  has been started on scopolamine patch, and as needed meclizine. Possible discharge today after PT assessment completed. 2. DM2 -CBG. elevated. Patient continue with home Lantus. Will add pre-meal NovoLog 4 units 3 times a day. Hemoglobin A1c pending. . 3. HTN - continue home BP meds 4. HLD - continue statin. 5. CKD stage 3 - creatinine of 1.8 is probably chronic (similar back in 2012), suspect this is due to what appears to be longstanding DM and HTN.     Discharge Exam:  Blood pressure 134/62, pulse 61, temperature 97.8 F (36.6 C), temperature source Oral, resp. rate 18, height 5' 2" (1.575 m), weight 93.486 kg (206 lb 1.6 oz), SpO2 100.00%.  Neck: Normal range of motion. Neck supple. No JVD present. No tracheal deviation present. No thyromegaly present.  Cardiovascular: Normal rate, regular rhythm and normal heart sounds. Exam reveals no gallop and no friction rub.  No murmur heard.  Pulmonary/Chest: Effort normal and breath  sounds normal. No respiratory distress. She has no wheezes. She exhibits no tenderness.  Abdominal: Soft. Bowel sounds are normal. She exhibits no distension and no mass. There is no tenderness. There is no rebound and no guarding.  Musculoskeletal: Normal range of motion. She exhibits no edema and no tenderness.  Lymphadenopathy:  She has no cervical adenopathy.  Neurological: She is alert and oriented to person, place, and time. No cranial nerve deficit. She exhibits normal muscle tone. Coordination abnormal.  5 Out of 5 strength in 4 extremities. Normal sensation x4 extremities. Patient had an ataxic gait. When asked to her in the room she instantly got dizzy and fell over back in to the bed. Abnormal cerebellar testing.  Skin: Skin is warm and dry. No rash noted. She is not diaphoretic. No erythema. No pallor.           Follow-up Information   Follow up with Lynne Logan, MD. Schedule an appointment as soon as possible for a visit in 1 week.   Specialty:   Family Medicine   Contact information:   [address] [place] [address] [phone]       Follow up with [facility] ENT. Call in 1 week. 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

## 2014-08-12 NOTE — Evaluation (Addendum)
Physical Therapy Evaluation Patient Details Name: Tammy KalataGracie C Wilms MRN: [id number] DOB: 04/05/1941 Today's Date: 08/12/2014   History of Present Illness  Patient is a 73 y/o female admitted to ED with 2 day history of intermittent dizziness / vertigo with sensation of fullness and impaired hearing R ear. Pt had associated left hand numbness which has resolved. Daughter reports she had slurred speech which has been intermittent for past 2 days. PMH of HTN, DM, arthritis.  CVA ruled out.   Clinical Impression  Patient presents with symptoms of dizziness affecting balance and safe mobility. Pt requires Min A for ambulation and mobility due to increased sway in standing and symptoms of dizziness with movement. Pt not a great historian. Pt not safe to be home alone at this time due to requiring hands on assist for safety. Recommend vestibular eval to better assess deficits. Education provided on safety techniques when mobilizing at home and being aware of dizziness symptoms prior to walking. Pt would benefit from acute PT and follow up HHPT to improve transfers, gait and balance so pt can maximize independence, reduce risk of falls and return to PLOF.     Follow Up Recommendations Home health PT;Supervision/Assistance - 24 hour    Equipment Recommendations  Other (comment)    Recommendations for Other Services       Precautions / Restrictions Precautions Precautions: Fall Precaution Comments: vertigo. Restrictions Weight Bearing Restrictions: No      Mobility  Bed Mobility Overal bed mobility: Needs Assistance Bed Mobility: Supine to Sit;Sit to Supine;Rolling Rolling: Supervision   Supine to sit: Supervision;HOB elevated Sit to supine: Supervision   General bed mobility comments: Supervision for safety and increased time due to symptoms of dizziness.  Transfers Overall transfer level: Needs assistance Equipment used: None;Rolling walker (2 wheeled) Transfers: Sit to/from  Stand Sit to Stand: Min guard;Min assist         General transfer comment: Min guard- Min A for support due to increased sway upon standing.   Ambulation/Gait Ambulation/Gait assistance: Min assist Ambulation Distance (Feet): 12 Feet (+24') Assistive device: Rolling walker (2 wheeled);None Gait Pattern/deviations: Step-through pattern;Decreased stride length Gait velocity: very slow   General Gait Details: Slow, guarded gait. Ambulated with RW and without RW. Unsteady with and without AD -performed furniture walking for support when not using RW. Sway noted and mild staggering. Pt masking symptoms at times, wants to go home. Distance limited due to symptoms of dizziness, "room spinning"  Stairs            Wheelchair Mobility    Modified Rankin (Stroke Patients Only)       Balance Overall balance assessment: Needs assistance   Sitting balance-Leahy Scale: Fair     Standing balance support: During functional activity Standing balance-Leahy Scale: Poor Standing balance comment: requires UE support on RW vs furniture walking to maintain balance/stability due to symptoms of dizziness. Increased sway in static standing and during gait.                             Pertinent Vitals/Pain Pain Assessment: No/denies pain (but reports "my right ear wants to pop.")    Home Living Family/patient expects to be discharged to:: Private residence Living Arrangements: Children;Alone (Has aide come in 7 days/week. Daughter stays with her until she leaves for work at [employer]2pm and son comes over after work ~3 pm.) Available Help at Discharge: Family;Neighbor;Available PRN/intermittently Type of Home: Apartment Home Access: Elevator  Home Layout: One level Home Equipment: Cane - single point      Prior Function Level of Independence: Independent with assistive device(s)         Comments: Pt reports using SPC for community ambulation. Aide assists with ADLs, family  assists with IADLs.     Hand Dominance        Extremity/Trunk Assessment   Upper Extremity Assessment: Overall WFL for tasks assessed           Lower Extremity Assessment: Overall WFL for tasks assessed         Communication   Communication: No difficulties  Cognition Arousal/Alertness: Awake/alert Behavior During Therapy: WFL for tasks assessed/performed Overall Cognitive Status: Within Functional Limits for tasks assessed                      General Comments General comments (skin integrity, edema, etc.): Pt with difficulty visually tracking in all directions seemed worsened when gazing Right however pt testing difficult due to closing eyes throughout. During R Dix hallpike - nystagmus noted however difficult to assess due to inconsistent reported symptoms. Delayed onset of symptoms ~45 sec, lasted ~1 minute. Inconclusive head thrust test as pt not able to relax to perform testing. Reports dizziness in no particular pattern. Difficult historian.    Exercises        Assessment/Plan    PT Assessment Patient needs continued PT services  PT Diagnosis Difficulty walking   PT Problem List Decreased balance;Decreased activity tolerance  PT Treatment Interventions Balance training;Gait training;Patient/family education;Neuromuscular re-education;Therapeutic activities   PT Goals (Current goals can be found in the Care Plan section) Acute Rehab PT Goals Patient Stated Goal: to make this dizziness go away PT Goal Formulation: With patient Time For Goal Achievement: 08/26/14 Potential to Achieve Goals: Good    Frequency Min 3X/week   Barriers to discharge Other (comment) pt lives alone.    Co-evaluation               End of Session Equipment Utilized During Treatment: Gait belt Activity Tolerance: Other (comment) (limited due to dizziness.) Patient left: in chair;with call bell/phone within reach Nurse Communication: Mobility status;Precautions     Functional Assessment Tool Used: Clinical judgment Functional Limitation: Mobility: Walking and moving around Mobility: Walking and Moving Around Current Status ([id number]): At least 20 percent but less than 40 percent impaired, limited or restricted Mobility: Walking and Moving Around Goal Status [phone](G8979): At least 1 percent but less than 20 percent impaired, limited or restricted    Time: 1311-1337 PT Time Calculation (min): 26 min   Charges:   PT Evaluation $Initial PT Evaluation Tier I: 1 Procedure PT Treatments $Gait Training: 8-22 mins   PT G Codes:   Functional Assessment Tool Used: Clinical judgment Functional Limitation: Mobility: Walking and moving around    JasperFolan, [place]una A 08/12/2014, 2:39 PM Alvie HeidelbergShauna Folan, PT, DPT [phone]

## 2014-08-12 NOTE — Progress Notes (Signed)
UR completed 

## 2014-08-12 NOTE — Progress Notes (Signed)
  Echocardiogram 2D Echocardiogram has been performed.  [name], [name] 08/12/2014, 3:39 PM

## 2014-08-12 NOTE — Care Management Note (Signed)
    Page 1 of 1   08/12/2014     2:41:12 PM CARE MANAGEMENT NOTE 08/12/2014  Patient:  Tammy Huff,Tammy Huff   Account Number:  [id number]  Date Initiated:  08/12/2014  Documentation initiated by:  GRAVES-BIGELOW,Thia Olesen  Subjective/Objective Assessment:   Pt admitted for vertigo. Pt has support of family.     Action/Plan:   CM did send an epic referral to the neuro rehab for outpatient vestibular therapy.   Anticipated DC Date:  08/12/2014   Anticipated DC Plan:  HOME/SELF CARE      DC Planning Services  CM consult      Choice offered to / List presented to:  Huff-1 Patient   DME arranged  Dan HumphreysWALKER      DME agency  [facility]        Status of service:  Completed, signed off Medicare Important Message given?  NO (If response is "NO", the following Medicare IM given date fields will be blank) Date Medicare IM given:   Medicare IM given by:   Date Additional Medicare IM given:   Additional Medicare IM given by:    Discharge Disposition:  HOME/SELF CARE  Per UR Regulation:  Reviewed for med. necessity/level of care/duration of stay  If discussed at Long Length of Stay Meetings, dates discussed:    Comments:  [facility]ME [facility]ed to [facility]. Unsure if they will deliver to room or home. No further needs from CM at this time.

## 2014-08-12 NOTE — ED Notes (Signed)
Pt. reports dizziness / unsteady gait onset this morning , denies fever / respirations unlabored , alert an doriented.

## 2014-08-12 NOTE — ED Notes (Signed)
Patient transported to X-ray 

## 2014-08-12 NOTE — Progress Notes (Signed)
Physical Therapy Treatment Patient Details Name: Tammy KalataGracie C Huff MRN: [id number] DOB: 12/20/1940 Today's Date: 08/12/2014    History of Present Illness Patient is a 73 y/o female admitted to ED with 2 day history of intermittent dizziness / vertigo with sensation of fullness and impaired hearing R ear. Pt had associated left hand numbness which has resolved. Daughter reports she had slurred speech which has been intermittent for past 2 days. PMH of HTN, DM, arthritis.  CVA ruled out.    PT Comments    Pt with constant dizziness during session.  Pt reports R hearing loss and fullness in R ear began at same time as dizziness.  Pt unable to focus gaze on stationary target and increased difficulty when tracking to Bil lower quadrants.  Pt reports attempting gaze stabilization while at rest or during mobility elicits increased sensation of dizziness.  At this time pt will require 24hr A at home from family and aide.  Will continue to follow while on acute.    Follow Up Recommendations  Outpatient PT;Supervision/Assistance - 24 hour (OPPT for Vestibular Rehab)     Equipment Recommendations  Rolling walker with 5" wheels    Recommendations for Other Services       Precautions / Restrictions Precautions Precautions: Fall Precaution Comments: vertigo. Restrictions Weight Bearing Restrictions: No    Mobility  Bed Mobility Overal bed mobility: Needs Assistance Bed Mobility: Supine to Sit;Sit to Supine;Rolling Rolling: Supervision   Supine to sit: Supervision;HOB elevated Sit to supine: Supervision   General bed mobility comments: pt sitting in recliner.  Transfers Overall transfer level: Needs assistance Equipment used: Rolling walker (2 wheeled) Transfers: Sit to/from Stand Sit to Stand: Min guard         General transfer comment: pt with mild sway during transfers, but no physical A needed.  Guarding for safety.    Ambulation/Gait Ambulation/Gait assistance: Min  guard Ambulation Distance (Feet): 20 Feet Assistive device: Rolling walker (2 wheeled) Gait Pattern/deviations: Step-through pattern;Decreased stride length;Staggering left;Staggering right;Trunk flexed Gait velocity: very slow   General Gait Details: pt with staggered gait and difficulty with balance.  Attempted gaze stabilization, however pt indicates this makes dizziness worse.     Stairs            Wheelchair Mobility    Modified Rankin (Stroke Patients Only)       Balance Overall balance assessment: Needs assistance Sitting-balance support: Feet supported;No upper extremity supported Sitting balance-Leahy Scale: Fair     Standing balance support: Single extremity supported;Bilateral upper extremity supported;During functional activity Standing balance-Leahy Scale: Poor Standing balance comment: pt needs UE support to maintain balance 2/2 dizziness.                      Cognition Arousal/Alertness: Awake/alert Behavior During Therapy: WFL for tasks assessed/performed Overall Cognitive Status: No family/caregiver present to determine baseline cognitive functioning                      Exercises      General Comments General comments (skin integrity, edema, etc.): pt reports dizziness on arrival to room.  Throughout session pt reports dizziness never resolved.  pt unable to focus eyes on stationary target and has increased difficulties when track to Bil lower quadrants.  Attempted gaze stabilization and pt reports this makes dizziness worse.  Only eyes closed seems to reduce level of dizziness.  pt reports R ear "fullness" and decreased hearing started at same time as dizziness.  Pertinent Vitals/Pain Pain Assessment: No/denies pain    Home Living Family/patient expects to be discharged to:: Private residence Living Arrangements: Children;Alone (Has aide come in 7 days/week. Daughter stays with her until she leaves for work at [employer]2pm and son  comes over after work ~3 pm.) Available Help at Discharge: Family;Neighbor;Available PRN/intermittently Type of Home: Apartment Home Access: Elevator   Home Layout: One level Home Equipment: Cane - single point      Prior Function Level of Independence: Independent with assistive device(s)      Comments: Pt reports using SPC for community ambulation. Aide assists with ADLs, family assists with IADLs.   PT Goals (current goals can now be found in the care plan section) Acute Rehab PT Goals Patient Stated Goal: to make this dizziness go away PT Goal Formulation: With patient Time For Goal Achievement: 08/26/14 Potential to Achieve Goals: Good Progress towards PT goals: Progressing toward goals    Frequency  Min 3X/week    PT Plan Discharge plan needs to be updated;Equipment recommendations need to be updated    Co-evaluation             End of Session Equipment Utilized During Treatment: Gait belt Activity Tolerance: Other (comment) (Limited by dizziness.  ) Patient left: in chair;with call bell/phone within reach     Time: 1352-1428 PT Time Calculation (min): 36 min  Charges:  $Gait Training: 8-22 mins $Therapeutic Activity: 8-22 mins                    G Codes:  Functional Assessment Tool Used: Clinical judgment Functional Limitation: Mobility: Walking and moving around Mobility: Walking and Moving Around Current Status [phone]([id number]): At least 20 percent but less than 40 percent impaired, limited or restricted Mobility: Walking and Moving Around Goal Status [phone]([id number]): At least 1 percent but less than 20 percent impaired, limited or restricted   Sunny SchleinRitenour, Adilynne Fitzwater F, South CarolinaPT [phone] 08/12/2014, 2:59 PM

## 2014-08-12 NOTE — ED Provider Notes (Signed)
CSN: [id number]     Arrival date & time 08/11/14  2339 History   First MD Initiated Contact with Patient 08/12/14 0011     Chief Complaint  Patient presents with  . Dizziness     (Consider location/radiation/quality/duration/timing/severity/associated sxs/prior Treatment) HPI Tammy Huff is a 73 y.o. female with past medical history of hypertension, diabetes, TIA coming in with dizziness. Patient states a single and on for the past 2 days. It got significantly worse this morning and in she states she is still dizzy currently in the room. She has associated left hand numbness. She states her symptoms are made worse when she walks or when she her eyes gaze upward. Her symptoms are better when she is sitting up. Per the daughter in the room she also had slurred speech when she caught her this evening concerned about her dizziness. She states she feels wobbly and unsteady on her feet. She denies this occurring in the past. She denies an infectious history such as fevers, cough, or changes in her bowel or bladder.  Patient has no further complaints.  10 Systems reviewed and are negative for acute change except as noted in the HPI.     Past Medical History  Diagnosis Date  . Diabetes mellitus without complication   . Hypertension   . Arthritis   . Diverticulitis    Past Surgical History  Procedure Laterality Date  . Colon resection     No family history on file. History  Substance Use Topics  . Smoking status: Never Smoker   . Smokeless tobacco: Not on file  . Alcohol Use: No   OB History   Grav Para Term Preterm Abortions TAB SAB Ect Mult Living                 Review of Systems    Allergies  Review of patient's allergies indicates no known allergies.  Home Medications   Prior to Admission medications   Medication Sig Start Date End Date Taking? Authorizing Provider  amLODipine (NORVASC) 10 MG tablet Take 10 mg by mouth every evening.   Yes Historical Provider, MD   carvedilol (COREG) 6.25 MG tablet Take 6.25 mg by mouth 2 (two) times daily with a meal.   Yes Historical Provider, MD  cloNIDine (CATAPRES) 0.2 MG tablet Take 0.2 mg by mouth 2 (two) times daily.   Yes Historical Provider, MD  diphenhydrAMINE (BENADRYL) 25 MG tablet Take 25 mg by mouth every morning.   Yes Historical Provider, MD  famotidine (PEPCID AC) 10 MG chewable tablet Chew 10 mg by mouth every morning.   Yes Historical Provider, MD  Insulin Glargine (LANTUS SOLOSTAR) 100 UNIT/ML Solostar Pen Inject 30 Units into the skin every morning.   Yes Historical Provider, MD  Iron-Vitamins (GERITOL PO) Take 1 tablet by mouth every morning.   Yes Historical Provider, MD  losartan-hydrochlorothiazide (HYZAAR) 100-25 MG per tablet Take 1 tablet by mouth every morning.   Yes Historical Provider, MD  pravastatin (PRAVACHOL) 80 MG tablet Take 80 mg by mouth every morning.   Yes Historical Provider, MD   BP 131/80  Pulse 66  Temp(Src) 98.5 F (36.9 C) (Oral)  Resp 16  Ht 5\' 2"  (1.575 m)  Wt 210 lb (95.255 kg)  BMI 38.40 kg/m2  SpO2 100% Physical Exam  Nursing note and vitals reviewed. Constitutional: She is oriented to person, place, and time. She appears well-developed and well-nourished. No distress.  HENT:  Head: Normocephalic and atraumatic.  Nose: Nose  normal.  Mouth/Throat: Oropharynx is clear and moist. No oropharyngeal exudate.  Eyes: Conjunctivae and EOM are normal. Pupils are equal, round, and reactive to light. No scleral icterus.  Neck: Normal range of motion. Neck supple. No JVD present. No tracheal deviation present. No thyromegaly present.  Cardiovascular: Normal rate, regular rhythm and normal heart sounds.  Exam reveals no gallop and no friction rub.   No murmur heard. Pulmonary/Chest: Effort normal and breath sounds normal. No respiratory distress. She has no wheezes. She exhibits no tenderness.  Abdominal: Soft. Bowel sounds are normal. She exhibits no distension and no  mass. There is no tenderness. There is no rebound and no guarding.  Musculoskeletal: Normal range of motion. She exhibits no edema and no tenderness.  Lymphadenopathy:    She has no cervical adenopathy.  Neurological: She is alert and oriented to person, place, and time. No cranial nerve deficit. She exhibits normal muscle tone. Coordination abnormal.  5 Out of 5 strength in 4 extremities. Normal sensation x4 extremities.  Patient had an ataxic gait. When asked to her in the room she instantly got dizzy and fell over back in to the bed. Abnormal cerebellar testing.  Skin: Skin is warm and dry. No rash noted. She is not diaphoretic. No erythema. No pallor.    ED Course  Procedures (including critical care time) Labs Review Labs Reviewed  CBC - Abnormal; Notable for the following:    RBC 3.71 (*)    Hemoglobin 11.1 (*)    HCT 32.2 (*)    All other components within normal limits  COMPREHENSIVE METABOLIC PANEL - Abnormal; Notable for the following:    Glucose, Bld 390 (*)    Creatinine, Ser 1.81 (*)    Albumin 3.3 (*)    Alkaline Phosphatase 165 (*)    GFR calc non Af Amer 26 (*)    GFR calc Af Amer 30 (*)    All other components within normal limits  URINALYSIS, ROUTINE W REFLEX MICROSCOPIC - Abnormal; Notable for the following:    Glucose, UA >1000 (*)    All other components within normal limits  URINE MICROSCOPIC-ADD ON - Abnormal; Notable for the following:    Squamous Epithelial / LPF FEW (*)    All other components within normal limits  I-STAT CHEM 8, ED - Abnormal; Notable for the following:    BUN 29 (*)    Creatinine, Ser 1.80 (*)    Glucose, Bld 373 (*)    Calcium, Ion 1.11 (*)    All other components within normal limits  ETHANOL  PROTIME-INR  APTT  DIFFERENTIAL  URINE RAPID DRUG SCREEN (HOSP PERFORMED)  HEMOGLOBIN A1C  I-STAT TROPOININ, ED  I-STAT TROPOININ, ED    Imaging Review Dg Chest 2 View  08/12/2014   CLINICAL DATA:  Weakness, shortness of breath,  dizziness. Initial encounter.  EXAM: CHEST  2 VIEW  COMPARISON:  04/26/2005  FINDINGS: Grossly unchanged cardiac silhouette and mediastinal contours with tortuosity of the thoracic aorta. There is mild interstitial thickening with perihilar predominance. No discrete focal airspace opacities. No pleural effusion pneumothorax. No evidence of edema. Stigmata of dish within the thoracic spine.  IMPRESSION: Findings suggestive of airways disease. No focal airspace opacities to suggest pneumonia.   Electronically Signed   By: Simonne ComeJohn  Watts M.D.   On: 08/12/2014 01:31   Ct Head Wo Contrast  08/12/2014   CLINICAL DATA:  Dizziness and unsteady gait.  Initial encounter.  EXAM: CT HEAD WITHOUT CONTRAST  TECHNIQUE: Contiguous  axial images were obtained from the base of the skull through the vertex without intravenous contrast.  COMPARISON:  01/22/2011  FINDINGS: Skull and Sinuses:Negative for fracture or destructive process. There is mucosal thickening within the left maxillary sinus, mainly inferiorly, similar to 2012. No sinus effusion.  Orbits: Bilateral cataract resection.  No acute findings.  Brain: No evidence of acute abnormality, such as acute infarction, hemorrhage, hydrocephalus, or mass lesion/mass effect. Cerebral volume is normal for age.  IMPRESSION: Negative head CT.   Electronically Signed   By: Tiburcio PeaJonathan  Watts M.D.   On: 08/12/2014 03:07     EKG Interpretation   Date/Time:  Thursday August 12 2014 00:36:29 EDT Ventricular Rate:  70 PR Interval:  144 QRS Duration: 100 QT Interval:  403 QTC Calculation: 435 R Axis:   18 Text Interpretation:  Sinus rhythm Borderline T wave abnormalities  Confirmed by Erroll Lunani, Charmelle Soh Ayokunle [phone]([id number]) on 08/12/2014 5:09:46 AM      MDM   Final diagnoses:  None    Patient does emergency department for dizziness. I cannot discern from central versus peripheral lesions. She has multiple risk factors and her hypertension diabetes and history of TIA. I will consult  a neurologist, anticipate her need for admission and MRI in the morning. Will obtain CT scan and infectious workup.  I spoke with Dr. Hosie PoissonSumner with neurology who agrees with the plan. Admit patient for MRI in the morning.   Tomasita CrumbleAdeleke Cannie Muckle, MD 08/12/14 [phone]

## 2014-08-12 NOTE — H&P (Signed)
[facility] History and Physical  Tammy KalataGracie C Sorn ZOX:[id number] DOB: 07/29/1941 DOA: 08/12/2014  Referring physician: EDP PCP: Leanor RubensteinSUN,VYVYAN Y, MD   Chief Complaint: Ataxia   HPI: Tammy Huff is a 73 y.o. female pmh of DM2, HTN, HLD, presents to ED with 2 day history of intermittent dizziness / vertigo.  Symptoms ongoing for 2 days, progressively worse this morning.  Had associated left hand numbness which has resolved.  Per daughter she also had slurred speech which has been intermittent for past 2 days.  Feels unsteady on feet.  Does have sensation of fullness and decreased hearing in R ear.  Review of Systems: Systems reviewed.  As above, otherwise negative  Past Medical History  Diagnosis Date  . Diabetes mellitus without complication   . Hypertension   . Arthritis   . Diverticulitis    Past Surgical History  Procedure Laterality Date  . Colon resection     Social History:  reports that she has never smoked. She does not have any smokeless tobacco history on file. She reports that she does not drink alcohol. Her drug history is not on file.  No Known Allergies  No family history on file.   Prior to Admission medications   Medication Sig Start Date End Date Taking? Authorizing Provider  amLODipine (NORVASC) 10 MG tablet Take 10 mg by mouth every evening.   Yes Historical Provider, MD  carvedilol (COREG) 6.25 MG tablet Take 6.25 mg by mouth 2 (two) times daily with a meal.   Yes Historical Provider, MD  cloNIDine (CATAPRES) 0.2 MG tablet Take 0.2 mg by mouth 2 (two) times daily.   Yes Historical Provider, MD  diphenhydrAMINE (BENADRYL) 25 MG tablet Take 25 mg by mouth every morning.   Yes Historical Provider, MD  famotidine (PEPCID AC) 10 MG chewable tablet Chew 10 mg by mouth every morning.   Yes Historical Provider, MD  Insulin Glargine (LANTUS SOLOSTAR) 100 UNIT/ML Solostar Pen Inject 30 Units into the skin every morning.   Yes Historical Provider, MD   Iron-Vitamins (GERITOL PO) Take 1 tablet by mouth every morning.   Yes Historical Provider, MD  losartan-hydrochlorothiazide (HYZAAR) 100-25 MG per tablet Take 1 tablet by mouth every morning.   Yes Historical Provider, MD  pravastatin (PRAVACHOL) 80 MG tablet Take 80 mg by mouth every morning.   Yes Historical Provider, MD   Physical Exam: Filed Vitals:   08/12/14 0200  BP: 146/92  Pulse: 61  Temp:   Resp: 22    BP 146/92  Pulse 61  Temp(Src) 98.5 F (36.9 C) (Oral)  Resp 22  Ht 5\' 2"  (1.575 m)  Wt 95.255 kg (210 lb)  BMI 38.40 kg/m2  SpO2 98%  General Appearance:    Alert, oriented, no distress, appears stated age  Head:    Normocephalic, atraumatic  Eyes:    PERRL, EOMI, sclera non-icteric        Nose:   Nares without drainage or epistaxis. Mucosa, turbinates normal  Throat:   Moist mucous membranes. Oropharynx without erythema or exudate.  Neck:   Supple. No carotid bruits.  No thyromegaly.  No lymphadenopathy.   Back:     No CVA tenderness, no spinal tenderness  Lungs:     Clear to auscultation bilaterally, without wheezes, rhonchi or rales  Chest wall:    No tenderness to palpitation  Heart:    Regular rate and rhythm without murmurs, gallops, rubs  Abdomen:     Soft, non-tender, nondistended, normal bowel  sounds, no organomegaly  Genitalia:    deferred  Rectal:    deferred  Extremities:   No clubbing, cyanosis or edema.  Pulses:   2+ and symmetric all extremities  Skin:   Skin color, texture, turgor normal, no rashes or lesions  Lymph nodes:   Cervical, supraclavicular, and axillary nodes normal  Neurologic:   CNII-XII intact. Normal strength, sensation and reflexes      throughout    Labs on Admission:  Basic Metabolic Panel:  Recent Labs Lab 08/12/14 0025 08/12/14 0043  NA 138 138  K 4.2 4.3  CL 98 101  CO2 28  --   GLUCOSE 390* 373*  BUN 22 29*  CREATININE 1.81* 1.80*  CALCIUM 9.0  --    Liver Function Tests:  Recent Labs Lab 08/12/14 0025   AST 22  ALT 13  ALKPHOS 165*  BILITOT 0.3  PROT 7.8  ALBUMIN 3.3*   No results found for this basename: LIPASE, AMYLASE,  in the last 168 hours No results found for this basename: AMMONIA,  in the last 168 hours CBC:  Recent Labs Lab 08/12/14 0025 08/12/14 0043  WBC 7.7  --   NEUTROABS 4.3  --   HGB 11.1* 12.6  HCT 32.2* 37.0  MCV 86.8  --   PLT 300  --    Cardiac Enzymes: No results found for this basename: CKTOTAL, CKMB, CKMBINDEX, TROPONINI,  in the last 168 hours  BNP (last 3 results) No results found for this basename: PROBNP,  in the last 8760 hours CBG: No results found for this basename: GLUCAP,  in the last 168 hours  Radiological Exams on Admission: Dg Chest 2 View  08/12/2014   CLINICAL DATA:  Weakness, shortness of breath, dizziness. Initial encounter.  EXAM: CHEST  2 VIEW  COMPARISON:  04/26/2005  FINDINGS: Grossly unchanged cardiac silhouette and mediastinal contours with tortuosity of the thoracic aorta. There is mild interstitial thickening with perihilar predominance. No discrete focal airspace opacities. No pleural effusion pneumothorax. No evidence of edema. Stigmata of dish within the thoracic spine.  IMPRESSION: Findings suggestive of airways disease. No focal airspace opacities to suggest pneumonia.   Electronically Signed   By: Simonne ComeJohn  Watts M.D.   On: 08/12/2014 01:31    EKG: Independently reviewed.  Assessment/Plan Active Problems:   Vertigo   DM2 (diabetes mellitus, type 2)   HTN (hypertension)   HLD (hyperlipidemia)   1. Vertigo - probably vestibular but rule out CVA with MRI/MRA brain in AM.  Needs full stroke work up if positive.  Ordering ASA 325. 2. DM2 - not at goal with BGL of 390 this evening.  Using home lantus, adding SSI med dose and getting diabetes coordinator involved to see about improving home control of this. 3. HTN - continue home BP meds 4. HLD - continue statin. 5. CKD stage 3 - creatinine of 1.8 is probably chronic  (similar back in 2012), suspect this is due to what appears to be longstanding DM and HTN.    Code Status: Full  Family Communication: Daughter at bedside Disposition Plan: Admit to inpatient   Time spent: 70 min  GARDNER, JARED M. [facility] Pager [phone]  If 7AM-7PM, please contact the day team taking care of the patient [email or web address] Password [email or web address] 08/12/2014, 3:05 AM

## 2014-08-12 NOTE — ED Notes (Signed)
Admitting at bedside 

## 2014-08-12 NOTE — Progress Notes (Addendum)
Inpatient Diabetes Program Recommendations  AACE/ADA: New Consensus Statement on Inpatient Glycemic Control (2013)  Target Ranges:  Prepandial:   less than 140 mg/dL      Peak postprandial:   less than 180 mg/dL (1-2 hours)      Critically ill patients:  140 - 180 mg/dL     Reason for assessment- consult to evaluate  Diabetes history: Type 2 Outpatient Diabetes medications: Lantus insulin, 30 units qam Current orders for Inpatient glycemic control:  Lantus insulin 30 units qam, Novolog moderate correction 0-15 units pre-meals. Will continue to follow and await A1C before making further recommendations.   Susette RacerJulie Montpellier, RN, BA, MHA, CDE Diabetes Coordinator Inpatient Diabetes Program  [phone] (Team Pager) [phone] Patrcia Dolly([place] Office) 08/12/2014 9:45 AM

## 2014-08-12 NOTE — Progress Notes (Signed)
VASCULAR LAB PRELIMINARY  PRELIMINARY  PRELIMINARY  PRELIMINARY  Carotid duplex completed.    Preliminary report:  Bilateral:  1-39% ICA stenosis.  Vertebral artery flow is antegrade.     Rennie Rouch, RVS 08/12/2014, 3:36 PM

## 2014-08-12 NOTE — Progress Notes (Signed)
Subjective: Patient feels significantly improved today and no longer is having symptoms of vertigo while in bed. She also feels her ear fullness has cleared.  She states the Meclizine was very beneficial.   Objective: Current vital signs: BP 143/56  Pulse 67  Temp(Src) 98.4 F (36.9 C) (Oral)  Resp 20  Ht 5\' 2"  (1.575 m)  Wt 93.486 kg (206 lb 1.6 oz)  BMI 37.69 kg/m2  SpO2 98% Vital signs in last 24 hours: Temp:  [97.7 F (36.5 C)-98.5 F (36.9 C)] 98.4 F (36.9 C) (10/15 0758) Pulse Rate:  [61-86] 67 (10/15 0758) Resp:  [16-22] 20 (10/15 0758) BP: (126-162)/(50-92) 143/56 mmHg (10/15 0758) SpO2:  [97 %-100 %] 98 % (10/15 0758) Weight:  [93.486 kg (206 lb 1.6 oz)-95.255 kg (210 lb)] 93.486 kg (206 lb 1.6 oz) (10/15 0357)  Intake/Output from previous day:   Intake/Output this shift:   Nutritional status: Carb Control  Neurologic Exam: General: NAD Mental Status: Alert, oriented, thought content appropriate.  Speech fluent without evidence of aphasia.  Able to follow 3 step commands without difficulty. Cranial Nerves: II: Visual fields grossly normal, pupils equal, round, reactive to light and accommodation III,IV, VI: ptosis not present, extra-ocular motions intact bilaterally V,VII: smile symmetric, facial light touch sensation normal bilaterally VIII: hearing normal bilaterally IX,X: gag reflex present XI: bilateral shoulder shrug XII: midline tongue extension without atrophy or fasciculations  Motor: Right : Upper extremity   5/5    Left:     Upper extremity   5/5  Lower extremity   5/5     Lower extremity   5/5 Tone and bulk:normal tone throughout; no atrophy noted Sensory: Pinprick and light touch intact throughout, bilaterally Deep Tendon Reflexes:  Right: Upper Extremity   Left: Upper extremity   biceps (C-5 to C-6) 2/4   biceps (C-5 to C-6) 2/4 tricep (C7) 2/4    triceps (C7) 2/4 Brachioradialis (C6) 2/4  Brachioradialis (C6) 2/4  Lower Extremity Lower  Extremity  quadriceps (L-2 to L-4) 2/4   quadriceps (L-2 to L-4) 2/4 Achilles (S1) 1/4   Achilles (S1) 1/4  Plantars: Right: downgoing   Left: downgoing Cerebellar: normal finger-to-nose,  normal heel-to-shin test    Lab Results: Basic Metabolic Panel:  Recent Labs Lab 08/12/14 0025 08/12/14 0043  NA 138 138  K 4.2 4.3  CL 98 101  CO2 28  --   GLUCOSE 390* 373*  BUN 22 29*  CREATININE 1.81* 1.80*  CALCIUM 9.0  --     Liver Function Tests:  Recent Labs Lab 08/12/14 0025  AST 22  ALT 13  ALKPHOS 165*  BILITOT 0.3  PROT 7.8  ALBUMIN 3.3*   No results found for this basename: LIPASE, AMYLASE,  in the last 168 hours No results found for this basename: AMMONIA,  in the last 168 hours  CBC:  Recent Labs Lab 08/12/14 0025 08/12/14 0043  WBC 7.7  --   NEUTROABS 4.3  --   HGB 11.1* 12.6  HCT 32.2* 37.0  MCV 86.8  --   PLT 300  --     Cardiac Enzymes: No results found for this basename: CKTOTAL, CKMB, CKMBINDEX, TROPONINI,  in the last 168 hours  Lipid Panel: No results found for this basename: CHOL, TRIG, HDL, CHOLHDL, VLDL, LDLCALC,  in the last 168 hours  CBG: No results found for this basename: GLUCAP,  in the last 168 hours  Microbiology: No results found for this or any previous visit.  Coagulation  Studies:  Recent Labs  08/12/14 0025  LABPROT 13.6  INR 1.03    Imaging: Dg Chest 2 View  08/12/2014   CLINICAL DATA:  Weakness, shortness of breath, dizziness. Initial encounter.  EXAM: CHEST  2 VIEW  COMPARISON:  04/26/2005  FINDINGS: Grossly unchanged cardiac silhouette and mediastinal contours with tortuosity of the thoracic aorta. There is mild interstitial thickening with perihilar predominance. No discrete focal airspace opacities. No pleural effusion pneumothorax. No evidence of edema. Stigmata of dish within the thoracic spine.  IMPRESSION: Findings suggestive of airways disease. No focal airspace opacities to suggest pneumonia.    Electronically Signed   By: Simonne ComeJohn  Watts M.D.   On: 08/12/2014 01:31   Ct Head Wo Contrast  08/12/2014   CLINICAL DATA:  Dizziness and unsteady gait.  Initial encounter.  EXAM: CT HEAD WITHOUT CONTRAST  TECHNIQUE: Contiguous axial images were obtained from the base of the skull through the vertex without intravenous contrast.  COMPARISON:  01/22/2011  FINDINGS: Skull and Sinuses:Negative for fracture or destructive process. There is mucosal thickening within the left maxillary sinus, mainly inferiorly, similar to 2012. No sinus effusion.  Orbits: Bilateral cataract resection.  No acute findings.  Brain: No evidence of acute abnormality, such as acute infarction, hemorrhage, hydrocephalus, or mass lesion/mass effect. Cerebral volume is normal for age.  IMPRESSION: Negative head CT.   Electronically Signed   By: Tiburcio PeaJonathan  Watts M.D.   On: 08/12/2014 03:07    Medications:  Scheduled: . amLODipine  10 mg Oral QPM  . aspirin EC  325 mg Oral Daily  . carvedilol  6.25 mg Oral BID WC  . cloNIDine  0.2 mg Oral BID  . diphenhydrAMINE  25 mg Oral Daily  . famotidine  10 mg Oral Daily  . heparin  5,000 Units Subcutaneous 3 times per day  . losartan  100 mg Oral Daily   And  . hydrochlorothiazide  25 mg Oral Daily  . insulin aspart  0-15 Units Subcutaneous TID WC  . insulin glargine  30 Units Subcutaneous Daily  . pravastatin  80 mg Oral q1800  . sodium chloride  3 mL Intravenous Q12H    Assessment/Plan:  Pateitns symptoms have improved and no longer having dizziness or slurred speech. MRI obtained awaiting final results.   Recommend: 1) PT evaluation for gait and vestibular process 2) Continue ASA and Meclizine PRN 3) No need to do further stroke work up    PublixDavid Janaysha Depaulo PA-C [facility] [phone]  08/12/2014, 9:35 AM

## 2014-08-12 NOTE — Consult Note (Signed)
Consult Reason for Consult:ataxia Referring Physician: Dr Mora Bellmanni  CC: ataxia  HPI: Lyda KalataGracie C Paras is an 73 y.o. female female with past medical history of hypertension, diabetes, TIA coming in with intermittent dizziness which per her description appears to be vertigo. Symptoms ongoing for the past 2 days, has gotten progressively worse this morning and in she states she is still dizzy currently in the room. She had associated left hand numbness which has resolved. She states her symptoms are made worse with neck extension. Not triggered by lateral rotation of head. Per the daughter in the room she also had slurred speech. This was intermittent for the past 2 days, daughter feels speech is still slightly slurred but has improved. She states she feels wobbly and unsteady on her feet. She denies this occurring in the past. She denies an infectious history such as fevers, cough, or changes in her bowel or bladder. She does note a sensation of fullness and decreased hearing in her right ear.    Past Medical History  Diagnosis Date  . Diabetes mellitus without complication   . Hypertension   . Arthritis   . Diverticulitis     Past Surgical History  Procedure Laterality Date  . Colon resection      No family history on file.  Social History:  reports that she has never smoked. She does not have any smokeless tobacco history on file. She reports that she does not drink alcohol. Her drug history is not on file.  No Known Allergies  Medications:  Scheduled: . amLODipine  10 mg Oral QPM  . aspirin EC  325 mg Oral Daily  . carvedilol  6.25 mg Oral BID WC  . cloNIDine  0.2 mg Oral BID  . diphenhydrAMINE  25 mg Oral Daily  . famotidine  10 mg Oral Daily  . heparin  5,000 Units Subcutaneous 3 times per day  . losartan  100 mg Oral Daily   And  . hydrochlorothiazide  25 mg Oral Daily  . insulin aspart  0-15 Units Subcutaneous TID WC  . insulin glargine  30 Units Subcutaneous Daily  .  pravastatin  80 mg Oral q1800  . sodium chloride  3 mL Intravenous Q12H    ROS: Out of a complete 14 system review, the patient complains of only the following symptoms, and all other reviewed systems are negative.  Physical Examination: Blood pressure 131/80, pulse 66, temperature 98.5 F (36.9 C), temperature source Oral, resp. rate 16, height 5\' 2"  (1.575 m), weight 95.255 kg (210 lb), SpO2 100.00%.  Neurologic Examination Mental Status: Alert, oriented, thought content appropriate.  Speech fluent without evidence of aphasia.  Able to follow 3 step commands without difficulty. Cranial Nerves: II: funduscopic exam wnl bilaterally, visual fields grossly normal, pupils equal, round, reactive to light and accommodation III,IV, VI: ptosis not present, extra-ocular motions intact bilaterally, no nystagmus noted. Due to room not able to perform Dix-Hallpike.  V,VII: smile symmetric, facial light touch sensation normal bilaterally VIII: hearing normal bilaterally IX,X: gag reflex present XI: trapezius strength/neck flexion strength normal bilaterally XII: tongue strength normal  Motor: Right : Upper extremity    Left:     Upper extremity 5/5 deltoid       5/5 deltoid 5/5 biceps      5/5 biceps  5/5 triceps      5/5 triceps 5/5wrist flexion     5/5 wrist flexion 5/5 wrist extension     5/5 wrist extension 5/5 hand grip  5/5 hand grip  Lower extremity     Lower extremity 5/5 hip flexor      5/5 hip flexor 5/5 hip adductors     5/5 hip adductors 5/5 hip abductors     5/5 hip abductors 5/5 quadricep      5/5 quadriceps  5/5 hamstrings     5/5 hamstrings 5/5 plantar flexion       5/5 plantar flexion 5/5 plantar extension     5/5 plantar extension Tone and bulk:normal tone throughout; no atrophy noted Sensory: Pinprick and light touch intact throughout, bilaterally Deep Tendon Reflexes: 2+ and symmetric throughout Plantars: Right: downgoing   Left: downgoing Cerebellar: normal  finger-to-nose, normal rapid alternating movements and normal heel-to-shin test Gait: sits upright slowly, notes worsening of her symptoms. Requires assistance to stand. Wide based gait. Upon standing immediately will fall back and to the left. Unable to safely take steps forward.   Laboratory Studies:   Basic Metabolic Panel:  Recent Labs Lab 08/12/14 0025 08/12/14 0043  NA 138 138  K 4.2 4.3  CL 98 101  CO2 28  --   GLUCOSE 390* 373*  BUN 22 29*  CREATININE 1.81* 1.80*  CALCIUM 9.0  --     Liver Function Tests:  Recent Labs Lab 08/12/14 0025  AST 22  ALT 13  ALKPHOS 165*  BILITOT 0.3  PROT 7.8  ALBUMIN 3.3*   No results found for this basename: LIPASE, AMYLASE,  in the last 168 hours No results found for this basename: AMMONIA,  in the last 168 hours  CBC:  Recent Labs Lab 08/12/14 0025 08/12/14 0043  WBC 7.7  --   NEUTROABS 4.3  --   HGB 11.1* 12.6  HCT 32.2* 37.0  MCV 86.8  --   PLT 300  --     Cardiac Enzymes: No results found for this basename: CKTOTAL, CKMB, CKMBINDEX, TROPONINI,  in the last 168 hours  BNP: No components found with this basename: POCBNP,   CBG: No results found for this basename: GLUCAP,  in the last 168 hours  Microbiology: No results found for this or any previous visit.  Coagulation Studies:  Recent Labs  08/12/14 0025  LABPROT 13.6  INR 1.03    Urinalysis: No results found for this basename: COLORURINE, APPERANCEUR, LABSPEC, PHURINE, GLUCOSEU, HGBUR, BILIRUBINUR, KETONESUR, PROTEINUR, UROBILINOGEN, NITRITE, LEUKOCYTESUR,  in the last 168 hours  Lipid Panel:  No results found for this basename: chol, trig, hdl, cholhdl, vldl, ldlcalc    HgbA1C:  No results found for this basename: HGBA1C    Urine Drug Screen:   No results found for this basename: labopia, cocainscrnur, labbenz, amphetmu, thcu, labbarb    Alcohol Level:  Recent Labs Lab 08/12/14 0025  ETH <11    Other results: EKG: normal EKG, normal  sinus rhythm  Imaging: Dg Chest 2 View  08/12/2014   CLINICAL DATA:  Weakness, shortness of breath, dizziness. Initial encounter.  EXAM: CHEST  2 VIEW  COMPARISON:  04/26/2005  FINDINGS: Grossly unchanged cardiac silhouette and mediastinal contours with tortuosity of the thoracic aorta. There is mild interstitial thickening with perihilar predominance. No discrete focal airspace opacities. No pleural effusion pneumothorax. No evidence of edema. Stigmata of dish within the thoracic spine.  IMPRESSION: Findings suggestive of airways disease. No focal airspace opacities to suggest pneumonia.   Electronically Signed   By: Simonne ComeJohn  Watts M.D.   On: 08/12/2014 01:31     Assessment/Plan:  Ms Andrey CampanileWilson is a 73 y.o.  female female with past medical history of hypertension, diabetes, TIA coming in with intermittent dizziness which per her description appears to be vertigo. Based on exam difficult to tease out whether this represents a central vs peripheral process. With multiple risk factors and past TIA she warrants further workup to rule out a vascular process. History of recent onset fullness and decreased hearing in right ear raises question of possible vestibular neuritis.   1)MRI and MRA of brain. If positive will complete stroke workup with 2D echo, carotid doppler, lipid panel, hemoglobin A1c 2)PT evaluation for gait instability 3)Meclizine PRN for symptomatic relief 4)Start ASA 325mg  5)Neurology to follow  Elspeth Cho, DO [facility] [phone]  If 7pm- 7am, please page neurology on call as listed in [facility]. 08/12/2014, 1:53 AM

## 2014-10-09 ENCOUNTER — Encounter (HOSPITAL_COMMUNITY): Payer: Self-pay | Admitting: Emergency Medicine

## 2014-10-09 ENCOUNTER — Emergency Department (HOSPITAL_COMMUNITY)
Admission: EM | Admit: 2014-10-09 | Discharge: 2014-10-09 | Disposition: A | Discharge status: Home / Self Care | Payer: Commercial Managed Care - HMO | Attending: Emergency Medicine | Admitting: Emergency Medicine

## 2014-10-09 DIAGNOSIS — M1990 Unspecified osteoarthritis, unspecified site: Secondary | ICD-10-CM | POA: Insufficient documentation

## 2014-10-09 DIAGNOSIS — Z8719 Personal history of other diseases of the digestive system: Secondary | ICD-10-CM | POA: Diagnosis not present

## 2014-10-09 DIAGNOSIS — Z7982 Long term (current) use of aspirin: Secondary | ICD-10-CM | POA: Diagnosis not present

## 2014-10-09 DIAGNOSIS — I10 Essential (primary) hypertension: Secondary | ICD-10-CM | POA: Insufficient documentation

## 2014-10-09 DIAGNOSIS — Z79899 Other long term (current) drug therapy: Secondary | ICD-10-CM | POA: Diagnosis not present

## 2014-10-09 DIAGNOSIS — Z794 Long term (current) use of insulin: Secondary | ICD-10-CM | POA: Insufficient documentation

## 2014-10-09 DIAGNOSIS — E162 Hypoglycemia, unspecified: Secondary | ICD-10-CM

## 2014-10-09 DIAGNOSIS — E11649 Type 2 diabetes mellitus with hypoglycemia without coma: Secondary | ICD-10-CM | POA: Insufficient documentation

## 2014-10-09 LAB — CBG MONITORING, ED
GLUCOSE-CAPILLARY: 151 mg/dL — AB (ref 70–99)
Glucose-Capillary: 161 mg/dL — ABNORMAL HIGH (ref 70–99)
Glucose-Capillary: 144 mg/dL — ABNORMAL HIGH (ref 70–99)
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
## 2014-10-09 NOTE — ED Notes (Addendum)
Per Freida BusmanAllen MD pt check CBG NOW and at 1700.
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
## 2014-10-09 NOTE — Discharge Instructions (Signed)
Hypoglycemia °Hypoglycemia occurs when the glucose in your blood is too low. Glucose is a type of sugar that is your body's main energy source. Hormones, such as insulin and glucagon, control the level of glucose in the blood. Insulin lowers blood glucose and glucagon increases blood glucose. Having too much insulin in your blood stream, or not eating enough food containing sugar, can result in hypoglycemia. Hypoglycemia can happen to people with or without diabetes. It can develop quickly and can be a medical emergency.  °CAUSES  °· Missing or delaying meals. °· Not eating enough carbohydrates at meals. °· Taking too much diabetes medicine. °· Not timing your oral diabetes medicine or insulin doses with meals, snacks, and exercise. °· Nausea and vomiting. °· Certain medicines. °· Severe illnesses, such as hepatitis, kidney disorders, and certain eating disorders. °· Increased activity or exercise without eating something extra or adjusting medicines. °· Drinking too much alcohol. °· A nerve disorder that affects body functions like your heart rate, blood pressure, and digestion (autonomic neuropathy). °· A condition where the stomach muscles do not function properly (gastroparesis). Therefore, medicines and food may not absorb properly. °· Rarely, a tumor of the pancreas can produce too much insulin. °SYMPTOMS  °· Hunger. °· Sweating (diaphoresis). °· Change in body temperature. °· Shakiness. °· Headache. °· Anxiety. °· Lightheadedness. °· Irritability. °· Difficulty concentrating. °· Dry mouth. °· Tingling or numbness in the hands or feet. °· Restless sleep or sleep disturbances. °· Altered speech and coordination. °· Change in mental status. °· Seizures or prolonged convulsions. °· Combativeness. °· Drowsiness (lethargic). °· Weakness. °· Increased heart rate or palpitations. °· Confusion. °· Pale, gray skin color. °· Blurred or double vision. °· Fainting. °DIAGNOSIS  °A physical exam and medical history will be  performed. Your caregiver may make a diagnosis based on your symptoms. Blood tests and other lab tests may be performed to confirm a diagnosis. Once the diagnosis is made, your caregiver will see if your signs and symptoms go away once your blood glucose is raised.  °TREATMENT  °Usually, you can easily treat your hypoglycemia when you notice symptoms. °· Check your blood glucose. If it is less than 70 mg/dl, take one of the following:   °¨ 3-4 glucose tablets.   °¨ ½ cup juice.   °¨ ½ cup regular soda.   °¨ 1 cup skim milk.   °¨ ½-1 tube of glucose gel.   °¨ 5-6 hard candies.   °· Avoid high-fat drinks or food that may delay a rise in blood glucose levels. °· Do not take more than the recommended amount of sugary foods, drinks, gel, or tablets. Doing so will cause your blood glucose to go too high.   °· Wait 10-15 minutes and recheck your blood glucose. If it is still less than 70 mg/dl or below your target range, repeat treatment.   °· Eat a snack if it is more than 1 hour until your next meal.   °There may be a time when your blood glucose may go so low that you are unable to treat yourself at home when you start to notice symptoms. You may need someone to help you. You may even faint or be unable to swallow. If you cannot treat yourself, someone will need to bring you to the hospital.  °HOME CARE INSTRUCTIONS °· If you have diabetes, follow your diabetes management plan by: °¨ Taking your medicines as directed. °¨ Following your exercise plan. °¨ Following your meal plan. Do not skip meals. Eat on time. °¨ Testing your blood   glucose regularly. Check your blood glucose before and after exercise. If you exercise longer or different than usual, be sure to check blood glucose more frequently. °¨ Wearing your medical alert jewelry that says you have diabetes. °· Identify the cause of your hypoglycemia. Then, develop ways to prevent the recurrence of hypoglycemia. °· Do not take a hot bath or shower right after an  insulin shot. °· Always carry treatment with you. Glucose tablets are the easiest to carry. °· If you are going to drink alcohol, drink it only with meals. °· Tell friends or family members ways to keep you safe during a seizure. This may include removing hard or sharp objects from the area or turning you on your side. °· Maintain a healthy weight. °SEEK MEDICAL CARE IF:  °· You are having problems keeping your blood glucose in your target range. °· You are having frequent episodes of hypoglycemia. °· You feel you might be having side effects from your medicines. °· You are not sure why your blood glucose is dropping so low. °· You notice a change in vision or a new problem with your vision. °SEEK IMMEDIATE MEDICAL CARE IF:  °· Confusion develops. °· A change in mental status occurs. °· The inability to swallow develops. °· Fainting occurs. °Document Released: 10/15/2005 Document Revised: 10/20/2013 Document Reviewed: 02/11/2012 °[employer]® Patient Information ©2015 [employer]. This information is not intended to replace advice given to you by your health care provider. Make sure you discuss any questions you have with your health care provider. ° °

## 2014-10-09 NOTE — ED Notes (Signed)
Bed: [id number] Expected date:  Expected time:  Means of arrival:  Comments: Diabetic crisis 35 treated A&O x 3 now

## 2014-10-11 ENCOUNTER — Encounter (HOSPITAL_COMMUNITY): Payer: Self-pay | Admitting: Physical Medicine and Rehabilitation

## 2014-10-11 ENCOUNTER — Emergency Department (HOSPITAL_COMMUNITY)
Admission: EM | Admit: 2014-10-11 | Discharge: 2014-10-11 | Disposition: A | Discharge status: Home / Self Care | Payer: Commercial Managed Care - HMO | Attending: Emergency Medicine | Admitting: Emergency Medicine

## 2014-10-11 DIAGNOSIS — I10 Essential (primary) hypertension: Secondary | ICD-10-CM | POA: Insufficient documentation

## 2014-10-11 DIAGNOSIS — Z7982 Long term (current) use of aspirin: Secondary | ICD-10-CM | POA: Insufficient documentation

## 2014-10-11 DIAGNOSIS — E162 Hypoglycemia, unspecified: Secondary | ICD-10-CM

## 2014-10-11 DIAGNOSIS — M1990 Unspecified osteoarthritis, unspecified site: Secondary | ICD-10-CM | POA: Insufficient documentation

## 2014-10-11 DIAGNOSIS — R410 Disorientation, unspecified: Secondary | ICD-10-CM | POA: Insufficient documentation

## 2014-10-11 DIAGNOSIS — Z794 Long term (current) use of insulin: Secondary | ICD-10-CM | POA: Insufficient documentation

## 2014-10-11 DIAGNOSIS — Z8719 Personal history of other diseases of the digestive system: Secondary | ICD-10-CM | POA: Insufficient documentation

## 2014-10-11 DIAGNOSIS — Z79899 Other long term (current) drug therapy: Secondary | ICD-10-CM | POA: Diagnosis not present

## 2014-10-11 DIAGNOSIS — E11649 Type 2 diabetes mellitus with hypoglycemia without coma: Secondary | ICD-10-CM | POA: Insufficient documentation

## 2014-10-11 LAB — COMPREHENSIVE METABOLIC PANEL
ALT: 18 U/L (ref 0–35)
AST: 35 U/L (ref 0–37)
Albumin: 3.5 g/dL (ref 3.5–5.2)
Alkaline Phosphatase: 132 U/L — ABNORMAL HIGH (ref 39–117)
Anion gap: 13 (ref 5–15)
BUN: 18 mg/dL (ref 6–23)
CALCIUM: 9.3 mg/dL (ref 8.4–10.5)
CO2: 25 mEq/L (ref 19–32)
Chloride: 101 mEq/L (ref 96–112)
Creatinine, Ser: 1.43 mg/dL — ABNORMAL HIGH (ref 0.50–1.10)
GFR calc Af Amer: 40 mL/min — ABNORMAL LOW (ref >90)
GFR calc non Af Amer: 35 mL/min — ABNORMAL LOW (ref >90)
Glucose, Bld: 171 mg/dL — ABNORMAL HIGH (ref 70–99)
Potassium: 4.3 mEq/L (ref 3.7–5.3)
SODIUM: 139 meq/L (ref 137–147)
TOTAL PROTEIN: 8.3 g/dL (ref 6.0–8.3)
Total Bilirubin: 0.4 mg/dL (ref 0.3–1.2)

## 2014-10-11 LAB — CBC WITH DIFFERENTIAL/PLATELET
BASOS ABS: 0 10*3/uL (ref 0.0–0.1)
Basophils Relative: 0 % (ref 0–1)
EOS ABS: 0.1 10*3/uL (ref 0.0–0.7)
EOS PCT: 1 % (ref 0–5)
HEMATOCRIT: 31.6 % — AB (ref 36.0–46.0)
Hemoglobin: 10.5 g/dL — ABNORMAL LOW (ref 12.0–15.0)
Lymphocytes Relative: 13 % (ref 12–46)
Lymphs Abs: 1.9 10*3/uL (ref 0.7–4.0)
MCH: 31.4 pg (ref 26.0–34.0)
MCHC: 33.2 g/dL (ref 30.0–36.0)
MCV: 94.6 fL (ref 78.0–100.0)
MONO ABS: 0.6 10*3/uL (ref 0.1–1.0)
Monocytes Relative: 4 % (ref 3–12)
Neutro Abs: 11.8 10*3/uL — ABNORMAL HIGH (ref 1.7–7.7)
Neutrophils Relative %: 82 % — ABNORMAL HIGH (ref 43–77)
PLATELETS: 324 10*3/uL (ref 150–400)
RBC: 3.34 MIL/uL — ABNORMAL LOW (ref 3.87–5.11)
RDW: 16.5 % — AB (ref 11.5–15.5)
WBC: 14.4 10*3/uL — ABNORMAL HIGH (ref 4.0–10.5)

## 2014-10-11 LAB — URINALYSIS, ROUTINE W REFLEX MICROSCOPIC
Bilirubin Urine: NEGATIVE
GLUCOSE, UA: NEGATIVE mg/dL
Hgb urine dipstick: NEGATIVE
Ketones, ur: NEGATIVE mg/dL
LEUKOCYTES UA: NEGATIVE
Nitrite: NEGATIVE
Protein, ur: 30 mg/dL — AB
SPECIFIC GRAVITY, URINE: 1.01 (ref 1.005–1.030)
UROBILINOGEN UA: 0.2 mg/dL (ref 0.0–1.0)
pH: 6.5 (ref 5.0–8.0)

## 2014-10-11 LAB — URINE MICROSCOPIC-ADD ON

## 2014-10-11 LAB — CBG MONITORING, ED
GLUCOSE-CAPILLARY: 264 mg/dL — AB (ref 70–99)
GLUCOSE-CAPILLARY: 324 mg/dL — AB (ref 70–99)
Glucose-Capillary: 192 mg/dL — ABNORMAL HIGH (ref 70–99)
Glucose-Capillary: 143 mg/dL — ABNORMAL HIGH (ref 70–99)

## 2014-10-11 NOTE — ED Notes (Signed)
PT returned to bed. Monitored by pulse ox, bp cuff, and 12-lead. 

## 2014-10-11 NOTE — ED Notes (Signed)
Pt presents to department for evaluation of hypolycemia, states CBG of 26 at adult daycare this morning, pt states she became very weak, tired and sleepy this morning. CBG 192 upon arrival to triage. Denies pain. Pt is alert and oriented x4.

## 2014-10-11 NOTE — Discharge Instructions (Signed)
Stop taking your novolog until you see Dr. Wynelle Huff.  Continue taking your Lantus 3units in the morning.    Hypoglycemia Hypoglycemia occurs when the glucose in your blood is too low. Glucose is a type of sugar that is your body's main energy source. Hormones, such as insulin and glucagon, control the level of glucose in the blood. Insulin lowers blood glucose and glucagon increases blood glucose. Having too much insulin in your blood stream, or not eating enough food containing sugar, can result in hypoglycemia. Hypoglycemia can happen to people with or without diabetes. It can develop quickly and can be a medical emergency.  CAUSES   Missing or delaying meals.  Not eating enough carbohydrates at meals.  Taking too much diabetes medicine.  Not timing your oral diabetes medicine or insulin doses with meals, snacks, and exercise.  Nausea and vomiting.  Certain medicines.  Severe illnesses, such as hepatitis, kidney disorders, and certain eating disorders.  Increased activity or exercise without eating something extra or adjusting medicines.  Drinking too much alcohol.  A nerve disorder that affects body functions like your heart rate, blood pressure, and digestion (autonomic neuropathy).  A condition where the stomach muscles do not function properly (gastroparesis). Therefore, medicines and food may not absorb properly.  Rarely, a tumor of the pancreas can produce too much insulin. SYMPTOMS   Hunger.  Sweating (diaphoresis).  Change in body temperature.  Shakiness.  Headache.  Anxiety.  Lightheadedness.  Irritability.  Difficulty concentrating.  Dry mouth.  Tingling or numbness in the hands or feet.  Restless sleep or sleep disturbances.  Altered speech and coordination.  Change in mental status.  Seizures or prolonged convulsions.  Combativeness.  Drowsiness (lethargic).  Weakness.  Increased heart rate or palpitations.  Confusion.  Pale, gray skin  color.  Blurred or double vision.  Fainting. DIAGNOSIS  A physical exam and medical history will be performed. Your caregiver may make a diagnosis based on your symptoms. Blood tests and other lab tests may be performed to confirm a diagnosis. Once the diagnosis is made, your caregiver will see if your signs and symptoms go away once your blood glucose is raised.  TREATMENT  Usually, you can easily treat your hypoglycemia when you notice symptoms.  Check your blood glucose. If it is less than 70 mg/dl, take one of the following:   3-4 glucose tablets.    cup juice.    cup regular soda.   1 cup skim milk.   -1 tube of glucose gel.   5-6 hard candies.   Avoid high-fat drinks or food that may delay a rise in blood glucose levels.  Do not take more than the recommended amount of sugary foods, drinks, gel, or tablets. Doing so will cause your blood glucose to go too high.   Wait 10-15 minutes and recheck your blood glucose. If it is still less than 70 mg/dl or below your target range, repeat treatment.   Eat a snack if it is more than 1 hour until your next meal.  There may be a time when your blood glucose may go so low that you are unable to treat yourself at home when you start to notice symptoms. You may need someone to help you. You may even faint or be unable to swallow. If you cannot treat yourself, someone will need to bring you to the hospital.  HOME CARE INSTRUCTIONS  If you have diabetes, follow your diabetes management plan by:  Taking your medicines as directed.  Following your exercise plan.  Following your meal plan. Do not skip meals. Eat on time.  Testing your blood glucose regularly. Check your blood glucose before and after exercise. If you exercise longer or different than usual, be sure to check blood glucose more frequently.  Wearing your medical alert jewelry that says you have diabetes.  Identify the cause of your hypoglycemia. Then,  develop ways to prevent the recurrence of hypoglycemia.  Do not take a hot bath or shower right after an insulin shot.  Always carry treatment with you. Glucose tablets are the easiest to carry.  If you are going to drink alcohol, drink it only with meals.  Tell friends or family members ways to keep you safe during a seizure. This may include removing hard or sharp objects from the area or turning you on your side.  Maintain a healthy weight. SEEK MEDICAL CARE IF:   You are having problems keeping your blood glucose in your target range.  You are having frequent episodes of hypoglycemia.  You feel you might be having side effects from your medicines.  You are not sure why your blood glucose is dropping so low.  You notice a change in vision or a new problem with your vision. SEEK IMMEDIATE MEDICAL CARE IF:   Confusion develops.  A change in mental status occurs.  The inability to swallow develops.  Fainting occurs. Document Released: 10/15/2005 Document Revised: 10/20/2013 Document Reviewed: 02/11/2012 [facility] Patient Information 2015 [employer] [place]. This information is not intended to replace advice given to you by your health care provider. Make sure you discuss any questions you have with your health care provider.

## 2014-10-11 NOTE — ED Notes (Signed)
Pt a/o x 4 on d/c in wheelchair with family. 

## 2014-10-11 NOTE — ED Notes (Addendum)
PT taken to bathroom in wheelchair by family. Family asked to inform staff if assistance in needed.

## 2014-10-11 NOTE — ED Provider Notes (Addendum)
CSN: [id number]     Arrival date & time 10/11/14  1340 History   First MD Initiated Contact with Patient 10/11/14 1429     Chief Complaint  Patient presents with  . Hypoglycemia     (Consider location/radiation/quality/duration/timing/severity/associated sxs/prior Treatment) HPI Comments: Patient presents with hypoglycemia. She has a history diabetes mellitus. She is on Lantus 3 units in the morning. She also was started on NovoLog on a recent hospitalization in October. She's currently taking 4 units 3 times a day of the NovoLog. She states she was [facility] and became confused and they noted her blood sugar to be 26. She was transported here. At our facility her blood sugar was much higher. She's otherwise been feeling fine. She had a recent ED visit 2 days ago for similar episode where her sugar had dropped. She also states that on this past Thursday her blood sugar dropped as well. She denies any recent illnesses. She says that she's been eating normally today. She does say that she ate this could be old this gets this morning. She's been noticing a little bit of increase in swelling of her legs. She denies any chest pain or shortness of breath. She denies any cough fevers or other recent illnesses.  Patient is a 73 y.o. female presenting with hypoglycemia.  Hypoglycemia Associated symptoms: no dizziness, no shortness of breath, no speech difficulty, no sweats and no vomiting     Past Medical History  Diagnosis Date  . Diabetes mellitus without complication   . Hypertension   . Arthritis   . Diverticulitis    Past Surgical History  Procedure Laterality Date  . Colon resection     No family history on file. History  Substance Use Topics  . Smoking status: Never Smoker   . Smokeless tobacco: Not on file  . Alcohol Use: No   OB History    No data available     Review of Systems  Constitutional: Negative for fever, chills, diaphoresis and fatigue.  HENT: Negative  for congestion, rhinorrhea and sneezing.   Eyes: Negative.   Respiratory: Negative for cough, chest tightness and shortness of breath.   Cardiovascular: Positive for leg swelling. Negative for chest pain.  Gastrointestinal: Negative for nausea, vomiting, abdominal pain, diarrhea and blood in stool.  Genitourinary: Negative for frequency, hematuria, flank pain and difficulty urinating.  Musculoskeletal: Negative for back pain and arthralgias.  Skin: Negative for rash.  Neurological: Negative for dizziness, speech difficulty, weakness, numbness and headaches.  Psychiatric/Behavioral: Positive for confusion.      Allergies  Review of patient's allergies indicates no known allergies.  Home Medications   Prior to Admission medications   Medication Sig Start Date End Date Taking? Authorizing Provider  amLODipine (NORVASC) 10 MG tablet Take 10 mg by mouth every evening.    Historical Provider, MD  aspirin EC 325 MG EC tablet Take 1 tablet (325 mg total) by mouth daily. 08/12/14   Richarda Overlie, MD  carvedilol (COREG) 6.25 MG tablet Take 6.25 mg by mouth 2 (two) times daily with a meal.    Historical Provider, MD  cloNIDine (CATAPRES) 0.2 MG tablet Take 0.2 mg by mouth 2 (two) times daily.    Historical Provider, MD  diphenhydrAMINE (BENADRYL) 25 MG tablet Take 25 mg by mouth every morning.    Historical Provider, MD  famotidine (PEPCID AC) 10 MG chewable tablet Chew 10 mg by mouth every morning.    Historical Provider, MD  insulin aspart (NOVOLOG)  100 UNIT/ML injection Inject 4 Units into the skin 3 (three) times daily with meals. 08/12/14   Richarda OverlieNayana Abrol, MD  Insulin Glargine (LANTUS SOLOSTAR) 100 UNIT/ML Solostar Pen Inject 30 Units into the skin every morning.    Historical Provider, MD  Iron-Vitamins (GERITOL PO) Take 1 tablet by mouth every morning.    Historical Provider, MD  losartan-hydrochlorothiazide (HYZAAR) 100-25 MG per tablet Take 1 tablet by mouth every morning.    Historical  Provider, MD  meclizine (ANTIVERT) 25 MG tablet Take 1 tablet (25 mg total) by mouth 3 (three) times daily as needed. 08/12/14   Richarda OverlieNayana Abrol, MD  pravastatin (PRAVACHOL) 80 MG tablet Take 80 mg by mouth every morning.    Historical Provider, MD  scopolamine (TRANSDERM-SCOP) 1 MG/3DAYS Place 1 patch (1.5 mg total) onto the skin every 3 (three) days. 08/12/14   Richarda OverlieNayana Abrol, MD   BP 166/59 mmHg  Pulse 80  Temp(Src) 97.3 F (36.3 C) (Oral)  Resp 21  SpO2 100% Physical Exam  Constitutional: She is oriented to person, place, and time. She appears well-developed and well-nourished.  HENT:  Head: Normocephalic and atraumatic.  Eyes: Pupils are equal, round, and reactive to light.  Neck: Normal range of motion. Neck supple.  Cardiovascular: Normal rate, regular rhythm and normal heart sounds.   Pulmonary/Chest: Effort normal and breath sounds normal. No respiratory distress. She has no wheezes. She has no rales. She exhibits no tenderness.  Abdominal: Soft. Bowel sounds are normal. There is no tenderness. There is no rebound and no guarding.  Musculoskeletal: Normal range of motion. She exhibits edema (bilateral mild pitting edema).  Lymphadenopathy:    She has no cervical adenopathy.  Neurological: She is alert and oriented to person, place, and time. She has normal strength. No cranial nerve deficit or sensory deficit. GCS eye subscore is 4. GCS verbal subscore is 5. GCS motor subscore is 6.  Skin: Skin is warm and dry. No rash noted.  Psychiatric: She has a normal mood and affect.    ED Course  Procedures (including critical care time) Labs Review Results for orders placed or performed during the hospital encounter of 10/11/14  CBC with Differential  Result Value Ref Range   WBC 14.4 (H) 4.0 - 10.5 K/uL   RBC 3.34 (L) 3.87 - 5.11 MIL/uL   Hemoglobin 10.5 (L) 12.0 - 15.0 g/dL   HCT 56.231.6 (L) 13.036.0 - 86.546.0 %   MCV 94.6 78.0 - 100.0 fL   MCH 31.4 26.0 - 34.0 pg   MCHC 33.2 30.0 - 36.0  g/dL   RDW 78.416.5 (H) 69.611.5 - 29.515.5 %   Platelets 324 150 - 400 K/uL   Neutrophils Relative % 82 (H) 43 - 77 %   Neutro Abs 11.8 (H) 1.7 - 7.7 K/uL   Lymphocytes Relative 13 12 - 46 %   Lymphs Abs 1.9 0.7 - 4.0 K/uL   Monocytes Relative 4 3 - 12 %   Monocytes Absolute 0.6 0.1 - 1.0 K/uL   Eosinophils Relative 1 0 - 5 %   Eosinophils Absolute 0.1 0.0 - 0.7 K/uL   Basophils Relative 0 0 - 1 %   Basophils Absolute 0.0 0.0 - 0.1 K/uL  Comprehensive metabolic panel  Result Value Ref Range   Sodium 139 137 - 147 mEq/L   Potassium 4.3 3.7 - 5.3 mEq/L   Chloride 101 96 - 112 mEq/L   CO2 25 19 - 32 mEq/L   Glucose, Bld 171 (H) 70 -  99 mg/dL   BUN 18 6 - 23 mg/dL   Creatinine, Ser 2.951.43 (H) 0.50 - 1.10 mg/dL   Calcium 9.3 8.4 - 62.110.5 mg/dL   Total Protein 8.3 6.0 - 8.3 g/dL   Albumin 3.5 3.5 - 5.2 g/dL   AST 35 0 - 37 U/L   ALT 18 0 - 35 U/L   Alkaline Phosphatase 132 (H) 39 - 117 U/L   Total Bilirubin 0.4 0.3 - 1.2 mg/dL   GFR calc non Af Amer 35 (L) >90 mL/min   GFR calc Af Amer 40 (L) >90 mL/min   Anion gap 13 5 - 15  Urinalysis, Routine w reflex microscopic  Result Value Ref Range   Color, Urine YELLOW YELLOW   APPearance CLEAR CLEAR   Specific Gravity, Urine 1.010 1.005 - 1.030   pH 6.5 5.0 - 8.0   Glucose, UA NEGATIVE NEGATIVE mg/dL   Hgb urine dipstick NEGATIVE NEGATIVE   Bilirubin Urine NEGATIVE NEGATIVE   Ketones, ur NEGATIVE NEGATIVE mg/dL   Protein, ur 30 (A) NEGATIVE mg/dL   Urobilinogen, UA 0.2 0.0 - 1.0 mg/dL   Nitrite NEGATIVE NEGATIVE   Leukocytes, UA NEGATIVE NEGATIVE  Urine microscopic-add on  Result Value Ref Range   Squamous Epithelial / LPF RARE RARE   WBC, UA 0-2 <3 WBC/hpf   RBC / HPF 0-2 <3 RBC/hpf   Bacteria, UA RARE RARE   No results found.    Imaging Review No results found.   EKG Interpretation None      MDM   Final diagnoses:  Hypoglycemia   Pt presents with hypogylcemia.  Is asymptomatic and acting normally. I will give her a meal  tray and monitor her for the next couple hours to make sure that her blood sugars remained stable. I talk [facility] her primary care physician[facility] We will go ahead and take her off of her NovoLog and just maintain her on her Lantus 3 units in the morning. Dr. Wynelle Linksun says that she was on this for many years without any problems. Dr. Wynelle Linksun will follow-up in office within the next week.  Dr. Micheline Mazeocherty to discharge if CBGs remain stable.  Pt's creatinine at baseline.    Rolan BuccoMelanie Helyne Genther, MD 10/11/14 1510  Rolan BuccoMelanie Amelita Risinger, MD 10/11/14 [phone]

## 2014-10-11 NOTE — ED Provider Notes (Signed)
7:45 PM Pt has not had any further hypoglycemic reads, has tolerate PO. I believe she is safe for close outpt f/u with PCP.   1. Hypoglycemia      Toy CookeyMegan Georgena Weisheit, MD 10/11/14 1945

## 2014-11-26 ENCOUNTER — Encounter (HOSPITAL_COMMUNITY): Payer: Self-pay | Admitting: Emergency Medicine

## 2014-11-26 ENCOUNTER — Inpatient Hospital Stay (HOSPITAL_COMMUNITY)
Admission: EM | Admit: 2014-11-26 | Discharge: 2014-11-29 | DRG: 638 | Disposition: A | Discharge status: Home / Self Care | Payer: Medicare (Managed Care) | Attending: Internal Medicine | Admitting: Internal Medicine

## 2014-11-26 DIAGNOSIS — Z79899 Other long term (current) drug therapy: Secondary | ICD-10-CM | POA: Diagnosis not present

## 2014-11-26 DIAGNOSIS — E1165 Type 2 diabetes mellitus with hyperglycemia: Secondary | ICD-10-CM

## 2014-11-26 DIAGNOSIS — E785 Hyperlipidemia, unspecified: Secondary | ICD-10-CM | POA: Diagnosis present

## 2014-11-26 DIAGNOSIS — E876 Hypokalemia: Secondary | ICD-10-CM | POA: Diagnosis present

## 2014-11-26 DIAGNOSIS — N183 Chronic kidney disease, stage 3 (moderate): Secondary | ICD-10-CM | POA: Diagnosis present

## 2014-11-26 DIAGNOSIS — Z794 Long term (current) use of insulin: Secondary | ICD-10-CM

## 2014-11-26 DIAGNOSIS — M1990 Unspecified osteoarthritis, unspecified site: Secondary | ICD-10-CM | POA: Diagnosis present

## 2014-11-26 DIAGNOSIS — E871 Hypo-osmolality and hyponatremia: Secondary | ICD-10-CM | POA: Diagnosis present

## 2014-11-26 DIAGNOSIS — E860 Dehydration: Secondary | ICD-10-CM | POA: Diagnosis present

## 2014-11-26 DIAGNOSIS — E1100 Type 2 diabetes mellitus with hyperosmolarity without nonketotic hyperglycemic-hyperosmolar coma (NKHHC): Secondary | ICD-10-CM | POA: Diagnosis present

## 2014-11-26 DIAGNOSIS — N179 Acute kidney failure, unspecified: Secondary | ICD-10-CM | POA: Diagnosis present

## 2014-11-26 DIAGNOSIS — Z8673 Personal history of transient ischemic attack (TIA), and cerebral infarction without residual deficits: Secondary | ICD-10-CM | POA: Diagnosis not present

## 2014-11-26 DIAGNOSIS — R739 Hyperglycemia, unspecified: Secondary | ICD-10-CM

## 2014-11-26 DIAGNOSIS — I129 Hypertensive chronic kidney disease with stage 1 through stage 4 chronic kidney disease, or unspecified chronic kidney disease: Secondary | ICD-10-CM | POA: Diagnosis present

## 2014-11-26 DIAGNOSIS — Z9119 Patient's noncompliance with other medical treatment and regimen: Secondary | ICD-10-CM | POA: Diagnosis present

## 2014-11-26 DIAGNOSIS — N189 Chronic kidney disease, unspecified: Secondary | ICD-10-CM | POA: Diagnosis present

## 2014-11-26 DIAGNOSIS — I10 Essential (primary) hypertension: Secondary | ICD-10-CM | POA: Diagnosis present

## 2014-11-26 DIAGNOSIS — E11649 Type 2 diabetes mellitus with hypoglycemia without coma: Secondary | ICD-10-CM | POA: Diagnosis present

## 2014-11-26 DIAGNOSIS — Z7982 Long term (current) use of aspirin: Secondary | ICD-10-CM

## 2014-11-26 DIAGNOSIS — R358 Other polyuria: Secondary | ICD-10-CM | POA: Diagnosis not present

## 2014-11-26 LAB — CBG MONITORING, ED: Glucose-Capillary: >600 mg/dL (ref 70–99)

## 2014-11-26 LAB — URINALYSIS, ROUTINE W REFLEX MICROSCOPIC
Bilirubin Urine: NEGATIVE
Hgb urine dipstick: NEGATIVE
Ketones, ur: NEGATIVE mg/dL
LEUKOCYTES UA: NEGATIVE
Nitrite: NEGATIVE
PH: 5 (ref 5.0–8.0)
PROTEIN: NEGATIVE mg/dL
Specific Gravity, Urine: 1.02 (ref 1.005–1.030)
Urobilinogen, UA: 0.2 mg/dL (ref 0.0–1.0)

## 2014-11-26 LAB — CBC WITH DIFFERENTIAL/PLATELET
Basophils Absolute: 0.1 10*3/uL (ref 0.0–0.1)
Basophils Relative: 1 % (ref 0–1)
EOS ABS: 0.1 10*3/uL (ref 0.0–0.7)
Eosinophils Relative: 2 % (ref 0–5)
HEMATOCRIT: 35.5 % — AB (ref 36.0–46.0)
HEMOGLOBIN: 12.6 g/dL (ref 12.0–15.0)
Lymphocytes Relative: 37 % (ref 12–46)
Lymphs Abs: 1.8 10*3/uL (ref 0.7–4.0)
MCH: 29.9 pg (ref 26.0–34.0)
MCHC: 35.5 g/dL (ref 30.0–36.0)
MCV: 84.1 fL (ref 78.0–100.0)
Monocytes Absolute: 0.4 10*3/uL (ref 0.1–1.0)
Monocytes Relative: 9 % (ref 3–12)
NEUTROS PCT: 51 % (ref 43–77)
Neutro Abs: 2.4 10*3/uL (ref 1.7–7.7)
Platelets: 272 10*3/uL (ref 150–400)
RBC: 4.22 MIL/uL (ref 3.87–5.11)
RDW: 13.5 % (ref 11.5–15.5)
WBC: 4.8 10*3/uL (ref 4.0–10.5)

## 2014-11-26 LAB — BASIC METABOLIC PANEL
ANION GAP: 16 — AB (ref 5–15)
Anion gap: 10 (ref 5–15)
BUN: 32 mg/dL — AB (ref 6–23)
BUN: 29 mg/dL — AB (ref 6–23)
CHLORIDE: 83 mmol/L — AB (ref 96–112)
CO2: 27 mmol/L (ref 19–32)
CO2: 31 mmol/L (ref 19–32)
CREATININE: 2.24 mg/dL — AB (ref 0.50–1.10)
Calcium: 9 mg/dL (ref 8.4–10.5)
Calcium: 8.7 mg/dL (ref 8.4–10.5)
Chloride: 94 mmol/L — ABNORMAL LOW (ref 96–112)
Creatinine, Ser: 1.97 mg/dL — ABNORMAL HIGH (ref 0.50–1.10)
GFR calc Af Amer: 27 mL/min — ABNORMAL LOW (ref >90)
GFR calc non Af Amer: 20 mL/min — ABNORMAL LOW (ref >90)
GFR, EST AFRICAN AMERICAN: 23 mL/min — AB (ref >90)
GFR, EST NON AFRICAN AMERICAN: 24 mL/min — AB (ref >90)
GLUCOSE: 411 mg/dL — AB (ref 70–99)
Glucose, Bld: 788 mg/dL (ref 70–99)
Potassium: 3.6 mmol/L (ref 3.5–5.1)
Potassium: 3.3 mmol/L — ABNORMAL LOW (ref 3.5–5.1)
Sodium: 126 mmol/L — ABNORMAL LOW (ref 135–145)
Sodium: 135 mmol/L (ref 135–145)

## 2014-11-26 LAB — BLOOD GAS, VENOUS
ACID-BASE EXCESS: 3.3 mmol/L — AB (ref 0.0–2.0)
BICARBONATE: 28.6 meq/L — AB (ref 20.0–24.0)
O2 Saturation: 59.7 %
PATIENT TEMPERATURE: 98.6
TCO2: 25.8 mmol/L (ref 0–100)
pCO2, Ven: 48.6 mmHg (ref 45.0–50.0)
pH, Ven: 7.388 — ABNORMAL HIGH (ref 7.250–7.300)

## 2014-11-26 LAB — URINE MICROSCOPIC-ADD ON

## 2014-11-26 LAB — MRSA PCR SCREENING: MRSA BY PCR: NEGATIVE

## 2014-11-26 MED ORDER — DEXTROSE-NACL 5-0.45 % IV SOLN
INTRAVENOUS | Status: DC
  Administered 2014-11-26: 16:00:00 via INTRAVENOUS

## 2014-11-26 MED ORDER — MECLIZINE HCL 25 MG PO TABS
25.0000 mg | ORAL_TABLET | Freq: Three times a day (TID) | ORAL | Status: DC | PRN
  Filled 2014-11-26: qty 1

## 2014-11-26 MED ORDER — ALBUTEROL SULFATE (2.5 MG/3ML) 0.083% IN NEBU: 2.5000 mg | INHALATION_SOLUTION | RESPIRATORY_TRACT | Status: DC | PRN

## 2014-11-26 MED ORDER — SODIUM CHLORIDE 0.9 % IV SOLN
INTRAVENOUS | Status: DC
  Administered 2014-11-26: 5.4 [IU]/h via INTRAVENOUS
  Filled 2014-11-26: qty 2.5

## 2014-11-26 MED ORDER — SODIUM CHLORIDE 0.9 % IV BOLUS (SEPSIS)
1000.0000 mL | Freq: Once | INTRAVENOUS | Status: AC
  Administered 2014-11-26: 1000 mL via INTRAVENOUS

## 2014-11-26 MED ORDER — DEXTROSE-NACL 5-0.45 % IV SOLN
INTRAVENOUS | Status: DC
  Administered 2014-11-26: 23:00:00 via INTRAVENOUS

## 2014-11-26 MED ORDER — CLONIDINE HCL 0.2 MG PO TABS
0.2000 mg | ORAL_TABLET | Freq: Two times a day (BID) | ORAL | Status: DC
  Administered 2014-11-26 – 2014-11-29 (×6): 0.2 mg via ORAL
  Filled 2014-11-26: qty 1
  Filled 2014-11-26: qty 2
  Filled 2014-11-26 (×4): qty 1
  Filled 2014-11-26: qty 2
  Filled 2014-11-26: qty 1

## 2014-11-26 MED ORDER — ENOXAPARIN SODIUM 30 MG/0.3ML ~~LOC~~ SOLN
30.0000 mg | SUBCUTANEOUS | Status: DC
  Administered 2014-11-26 – 2014-11-28 (×3): 30 mg via SUBCUTANEOUS
  Filled 2014-11-26 (×5): qty 0.3

## 2014-11-26 MED ORDER — CARVEDILOL 6.25 MG PO TABS
6.2500 mg | ORAL_TABLET | Freq: Two times a day (BID) | ORAL | Status: DC
  Administered 2014-11-26 – 2014-11-29 (×6): 6.25 mg via ORAL
  Filled 2014-11-26 (×9): qty 1

## 2014-11-26 MED ORDER — PRAVASTATIN SODIUM 80 MG PO TABS
80.0000 mg | ORAL_TABLET | Freq: Every day | ORAL | Status: DC
  Administered 2014-11-27 – 2014-11-29 (×3): 80 mg via ORAL
  Filled 2014-11-26: qty 4
  Filled 2014-11-26 (×2): qty 1

## 2014-11-26 MED ORDER — DIPHENHYDRAMINE HCL 25 MG PO CAPS: 25.0000 mg | ORAL_CAPSULE | Freq: Every day | ORAL | Status: DC | PRN

## 2014-11-26 MED ORDER — ONDANSETRON HCL 4 MG/2ML IJ SOLN: 4.0000 mg | Freq: Four times a day (QID) | INTRAMUSCULAR | Status: DC | PRN

## 2014-11-26 MED ORDER — FAMOTIDINE 10 MG PO TABS
10.0000 mg | ORAL_TABLET | Freq: Every evening | ORAL | Status: DC
  Administered 2014-11-26 – 2014-11-28 (×3): 10 mg via ORAL
  Filled 2014-11-26 (×5): qty 1

## 2014-11-26 MED ORDER — INSULIN REGULAR HUMAN 100 UNIT/ML IJ SOLN
INTRAMUSCULAR | Status: DC
  Administered 2014-11-26: 6.9 [IU]/h via INTRAVENOUS
  Filled 2014-11-26: qty 2.5

## 2014-11-26 MED ORDER — ASPIRIN EC 325 MG PO TBEC
325.0000 mg | DELAYED_RELEASE_TABLET | Freq: Every day | ORAL | Status: DC
  Administered 2014-11-27 – 2014-11-29 (×3): 325 mg via ORAL
  Filled 2014-11-26 (×3): qty 1

## 2014-11-26 MED ORDER — AMLODIPINE BESYLATE 10 MG PO TABS
10.0000 mg | ORAL_TABLET | Freq: Every day | ORAL | Status: DC
  Administered 2014-11-26 – 2014-11-28 (×3): 10 mg via ORAL
  Filled 2014-11-26 (×5): qty 1

## 2014-11-26 MED ORDER — SODIUM CHLORIDE 0.9 % IJ SOLN
3.0000 mL | Freq: Two times a day (BID) | INTRAMUSCULAR | Status: DC
  Administered 2014-11-26 – 2014-11-29 (×5): 3 mL via INTRAVENOUS

## 2014-11-26 MED ORDER — INSULIN REGULAR BOLUS VIA INFUSION
0.0000 [IU] | Freq: Three times a day (TID) | INTRAVENOUS | Status: DC
  Filled 2014-11-26: qty 10

## 2014-11-26 MED ORDER — ONDANSETRON HCL 4 MG PO TABS: 4.0000 mg | ORAL_TABLET | Freq: Four times a day (QID) | ORAL | Status: DC | PRN

## 2014-11-26 MED ORDER — ACETAMINOPHEN 325 MG PO TABS: 650.0000 mg | ORAL_TABLET | Freq: Four times a day (QID) | ORAL | Status: DC | PRN

## 2014-11-26 MED ORDER — ACETAMINOPHEN 650 MG RE SUPP: 650.0000 mg | Freq: Four times a day (QID) | RECTAL | Status: DC | PRN

## 2014-11-26 MED ORDER — DEXTROSE 50 % IV SOLN: 25.0000 mL | INTRAVENOUS | Status: DC | PRN

## 2014-11-26 MED ORDER — SODIUM CHLORIDE 0.9 % IV SOLN
INTRAVENOUS | Status: DC
  Administered 2014-11-26: 19:00:00 via INTRAVENOUS

## 2014-11-26 NOTE — ED Notes (Signed)
Bed: WA20 Expected date:  Expected time:  Means of arrival:  Comments: Triage 2 

## 2014-11-26 NOTE — ED Notes (Signed)
[facility] Expected date:  Expected time:  Means of arrival:  Comments: EMS=sugar high

## 2014-11-26 NOTE — ED Provider Notes (Signed)
CSN: [id number]     Arrival date & time 11/26/14  1445 History   First MD Initiated Contact with Patient 11/26/14 1502     Chief Complaint  Patient presents with  . Hyperglycemia     (Consider location/radiation/quality/duration/timing/severity/associated sxs/prior Treatment) HPI Tammy Huff is a 74 y.o. female with hx of hypertension, DM, presents to ED with complaint of hyperglycemia. Pt states "I am in a donut hole." She explains that is when she is in transition from one primary care to another because of her insurance. Patient states that she has not had any of her medications in one month. She states she went to her primary care doctor appointment today and they noticed that her blood sugar was greater than 600 and he brought her here. Patient admits to polyuria, polydipsia, however she denies any other complaints. She states she "feels well." Pt reports she has been on lantus, novolog for her diabetes.   Past Medical History  Diagnosis Date  . Diabetes mellitus without complication   . Hypertension   . Arthritis   . Diverticulitis    Past Surgical History  Procedure Laterality Date  . Colon resection     No family history on file. History  Substance Use Topics  . Smoking status: Never Smoker   . Smokeless tobacco: Not on file  . Alcohol Use: No   OB History    No data available     Review of Systems  Constitutional: Negative for fever and chills.  Respiratory: Negative for cough, chest tightness and shortness of breath.   Cardiovascular: Negative for chest pain, palpitations and leg swelling.  Gastrointestinal: Negative for nausea, vomiting, abdominal pain and diarrhea.  Endocrine: Positive for polydipsia and polyuria.  Genitourinary: Negative for dysuria, flank pain, vaginal bleeding, vaginal discharge, vaginal pain and pelvic pain.  Musculoskeletal: Negative for myalgias, arthralgias, neck pain and neck stiffness.  Skin: Negative for rash.  Neurological:  Negative for dizziness, weakness and headaches.  All other systems reviewed and are negative.     Allergies  Review of patient's allergies indicates no known allergies.  Home Medications   Prior to Admission medications   Medication Sig Start Date End Date Taking? Authorizing Provider  amLODipine (NORVASC) 10 MG tablet Take 10 mg by mouth every evening.   Yes Historical Provider, MD  aspirin EC 325 MG EC tablet Take 1 tablet (325 mg total) by mouth daily. 08/12/14  Yes Richarda OverlieNayana Abrol, MD  carvedilol (COREG) 6.25 MG tablet Take 6.25 mg by mouth 2 (two) times daily with a meal.   Yes Historical Provider, MD  cloNIDine (CATAPRES) 0.2 MG tablet Take 0.2 mg by mouth 2 (two) times daily.   Yes Historical Provider, MD  diphenhydrAMINE (BENADRYL) 25 MG tablet Take 25 mg by mouth daily as needed for allergies.    Yes Historical Provider, MD  famotidine (PEPCID AC) 10 MG chewable tablet Chew 10 mg by mouth every evening.    Yes Historical Provider, MD  Insulin Glargine (LANTUS SOLOSTAR) 100 UNIT/ML Solostar Pen Inject 30 Units into the skin every morning.   Yes Historical Provider, MD  Iron-Vitamins (GERITOL PO) Take 1 tablet by mouth every morning.   Yes Historical Provider, MD  losartan-hydrochlorothiazide (HYZAAR) 100-25 MG per tablet Take 1 tablet by mouth every morning.   Yes Historical Provider, MD  meclizine (ANTIVERT) 25 MG tablet Take 1 tablet (25 mg total) by mouth 3 (three) times daily as needed. 08/12/14  Yes Richarda OverlieNayana Abrol, MD  pravastatin (PRAVACHOL)  80 MG tablet Take 80 mg by mouth every morning.   Yes Historical Provider, MD  scopolamine (TRANSDERM-SCOP) 1 MG/3DAYS Place 1 patch (1.5 mg total) onto the skin every 3 (three) days. 08/12/14  Yes Richarda Overlie, MD  insulin aspart (NOVOLOG) 100 UNIT/ML injection Inject 4 Units into the skin 3 (three) times daily with meals. Patient not taking: Reported on 11/26/2014 08/12/14   Richarda Overlie, MD   BP 151/62 mmHg  Pulse 74  Temp(Src) 98.3 F  (36.8 C) (Oral)  Resp 16  SpO2 94% Physical Exam  Constitutional: She is oriented to person, place, and time. She appears well-developed and well-nourished. No distress.  HENT:  Head: Normocephalic.  Eyes: Conjunctivae are normal.  Neck: Neck supple.  Cardiovascular: Normal rate, regular rhythm and normal heart sounds.   Pulmonary/Chest: Effort normal and breath sounds normal. No respiratory distress. She has no wheezes. She has no rales.  Abdominal: Soft. Bowel sounds are normal. She exhibits no distension. There is no tenderness. There is no rebound.  Musculoskeletal: She exhibits no edema.  Neurological: She is alert and oriented to person, place, and time.  Skin: Skin is warm and dry.  Psychiatric: She has a normal mood and affect. Her behavior is normal.  Nursing note and vitals reviewed.   ED Course  Procedures (including critical care time) Labs Review Labs Reviewed  BASIC METABOLIC PANEL - Abnormal; Notable for the following:    Sodium 126 (*)    Chloride 83 (*)    Glucose, Bld 788 (*)    BUN 32 (*)    Creatinine, Ser 2.24 (*)    GFR calc non Af Amer 20 (*)    GFR calc Af Amer 23 (*)    Anion gap 16 (*)    All other components within normal limits  CBC WITH DIFFERENTIAL/PLATELET - Abnormal; Notable for the following:    HCT 35.5 (*)    All other components within normal limits  BLOOD GAS, VENOUS - Abnormal; Notable for the following:    pH, Ven 7.388 (*)    Bicarbonate 28.6 (*)    Acid-Base Excess 3.3 (*)    All other components within normal limits  CBG MONITORING, ED - Abnormal; Notable for the following:    Glucose-Capillary >600 (*)    All other components within normal limits  CBG MONITORING, ED - Abnormal; Notable for the following:    Glucose-Capillary >600 (*)    All other components within normal limits  URINALYSIS, ROUTINE W REFLEX MICROSCOPIC    Imaging Review No results found.   EKG Interpretation None      MDM   Final diagnoses:   Hyperglycemia     patient in emergency department from PCP office, blood sugar reading high. Patient has been off of her medications for 1 month. She is not complaining of anything at this moment. Blood pressure high, otherwise normal vital signs. Will get labs, IV fluids started.   4:56 PM Blood glucose is 788. Anion gap is 16. Creatinine elevated at 2.24, patient's baseline is around 1.8. PH venous 7.388. Will admit for hyperglycemia. Started on glucose stabilizer. Vital signs continued to be stable. I discussed patient with tried hospitalist, they will admit her.  Filed Vitals:   11/26/14 1447  BP: 151/62  Pulse: 74  Temp: 98.3 F (36.8 C)  Resp: [id number] A Georgio Hattabaugh, PA-C 11/27/14 0123  Toy Baker, MD 11/28/14 1729

## 2014-11-26 NOTE — ED Provider Notes (Signed)
Medical screening examination/treatment/procedure(s) were conducted as a shared visit with non-physician practitioner(s) and myself.  I personally evaluated the patient during the encounter.   EKG Interpretation None     Patient here with hyperglycemia times several days. Was seen by her doctor today and had elevated blood sugar and sent here. Blood sugar here is over 700. Patient started on IV fluids as well as Glucomander and will be admitted to the hospitalist service  Toy BakerAnthony T Donnel Venuto, MD 11/26/14 [phone]

## 2014-11-26 NOTE — H&P (Signed)
History and Physical  Tammy KalataGracie C Dagley JWJ:[id number] DOB: 12/13/1940 DOA: 11/26/2014  Referring physician: Jaynie Crumbleatyana Kirichenko, ED PA-C PCP: Leanor RubensteinSUN,VYVYAN Y, MD  Outpatient Specialists:  1. Not known.  Chief Complaint: High blood sugars.  HPI: Tammy Huff is a 74 y.o. female with history of long-standing DM 2/IDDM with renal complications, essential hypertension, hyperlipidemia, chronic kidney disease, was sent from PCPs office due to hyperglycemia. Patient states that she was in a "donut hole" , was transitioning between PCPs and hence stopped taking Lantus after 10/29/14. She stopped taking NovoLog after the first week of January for unclear reasons. She complains of dry mouth and feeling thirsty but denies polyuria, itching, weakness or dizziness. She denies any other complaints. No fever, chills, dysuria, chest pain, dyspnea, cough, nausea, vomiting or diarrhea. She claims to check her blood sugars twice a day and apparently ranging between 120-135 mg per DL. She is noncompliant with diet. Her insurance finally kicked in the middle of this month and she had a scheduled visit with her new PCP today. At the PCPs office, her blood sugar read greater than 600 and she was sent to the ED for evaluation. 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 In the ED, sodium 126, glucose 788, BUN 32, creatinine 2.24, venous pH 7.388. Hospitalist admission was requested.   Review of Systems: All systems reviewed and apart from history of presenting illness, are negative.  Past Medical History  Diagnosis Date  . Diabetes mellitus without complication   . Hypertension   . Arthritis   . Diverticulitis    Past Surgical History  Procedure Laterality Date  . Colon resection     Social History:  reports that she has never smoked. She does not have any smokeless tobacco history on file. She reports that she does not drink alcohol. Her drug history is not on  file. Widowed. Lives alone. Independent of activities of daily living.  No Known Allergies  History reviewed. No pertinent family history. denies significant family history.  Prior to Admission medications   Medication Sig Start Date End Date Taking? Authorizing Provider  amLODipine (NORVASC) 10 MG tablet Take 10 mg by mouth every evening.   Yes Historical Provider, MD  aspirin EC 325 MG EC tablet Take 1 tablet (325 mg total) by mouth daily. 08/12/14  Yes Richarda OverlieNayana Abrol, MD  carvedilol (COREG) 6.25 MG tablet Take 6.25 mg by mouth 2 (two) times daily with a meal.   Yes Historical Provider, MD  cloNIDine (CATAPRES) 0.2 MG tablet Take 0.2 mg by mouth 2 (two) times daily.   Yes Historical Provider, MD  diphenhydrAMINE (BENADRYL) 25 MG tablet Take 25 mg by mouth daily as needed for allergies.    Yes Historical Provider, MD  famotidine (PEPCID AC) 10 MG chewable tablet Chew 10 mg by mouth every evening.    Yes Historical Provider, MD  Insulin Glargine (LANTUS SOLOSTAR) 100 UNIT/ML Solostar Pen Inject 30 Units into the skin every morning.   Yes Historical Provider, MD  Iron-Vitamins (GERITOL PO) Take 1 tablet by mouth every morning.   Yes Historical Provider, MD  losartan-hydrochlorothiazide (HYZAAR) 100-25 MG per tablet Take 1 tablet by mouth every morning.   Yes Historical Provider, MD  meclizine (ANTIVERT) 25 MG tablet Take 1 tablet (25 mg total) by mouth 3 (three) times daily as needed. 08/12/14  Yes Richarda OverlieNayana Abrol, MD  pravastatin (PRAVACHOL) 80 MG tablet Take 80 mg by mouth every morning.  Yes Historical Provider, MD  scopolamine (TRANSDERM-SCOP) 1 MG/3DAYS Place 1 patch (1.5 mg total) onto the skin every 3 (three) days. 08/12/14  Yes Richarda Overlie, MD   Physical Exam: Filed Vitals:   11/26/14 1447 11/26/14 1707 11/26/14 1750  BP: 151/62 142/76 164/59  Pulse: 74 70 70  Temp: 98.3 F (36.8 C)    TempSrc: Oral    Resp: Height:    (1.575 m)  Weight:   92.8 kg (204 lb 9.4 oz)   SpO2: 94% 94% 100%     General exam: Moderately built and nourished pleasant elderly female patient, lying comfortably supine on the gurney in no obvious distress.  Head, eyes and ENT: Nontraumatic and normocephalic. Pupils equally reacting to light and accommodation. Bilateral arcus senilis. Oral mucosa dry.  Neck: Supple. No JVD, carotid bruit or thyromegaly.  Lymphatics: No lymphadenopathy.  Respiratory system: Clear to auscultation. No increased work of breathing.  Cardiovascular system: S1 and S2 heard, RRR. No JVD, murmurs, gallops, clicks. Trace bilateral ankle edema.  Gastrointestinal system: Abdomen is nondistended, soft and nontender. Normal bowel sounds heard. No organomegaly or masses appreciated.  Central nervous system: Alert and oriented. No focal neurological deficits.  Extremities: Symmetric 5 x 5 power. Peripheral pulses symmetrically felt.   Skin: No rashes or acute findings.  Musculoskeletal system: Negative exam.  Psychiatry: Pleasant and cooperative.   Labs on Admission:  Basic Metabolic Panel:  Recent Labs Lab 11/26/14 1526  NA 126*  K 3.6  CL 83*  CO2 27  GLUCOSE 788*  BUN 32*  CREATININE 2.24*  CALCIUM 9.0   Liver Function Tests: No results for input(s): AST, ALT, ALKPHOS, BILITOT, PROT, ALBUMIN in the last 168 hours. No results for input(s): LIPASE, AMYLASE in the last 168 hours. No results for input(s): AMMONIA in the last 168 hours. CBC:  Recent Labs Lab 11/26/14 1526  WBC 4.8  NEUTROABS 2.4  HGB 12.6  HCT 35.5*  MCV 84.1  PLT 272   Cardiac Enzymes: No results for input(s): CKTOTAL, CKMB, CKMBINDEX, TROPONINI in the last 168 hours.  BNP (last 3 results) No results for input(s): PROBNP in the last 8760 hours. CBG:  Recent Labs Lab 11/26/14 1450 11/26/14 1541 11/26/14 1659  GLUCAP >600* >600* >600*    Radiological Exams on Admission: No results found.  EKG: None seen in Epic for today. Will  request.  Assessment/Plan Principal Problem:   DM hyperosmolarity type II, uncontrolled Active Problems:   HLD (hyperlipidemia)   Essential hypertension   Dehydration   Renal failure, acute on chronic   1. DM hyperosmolarity type II, uncontrolled: Precipitated by noncompliance to insulins secondary to insurance issues and dietary indiscretion. Admitted to step down. Aggressive IV fluid hydration. Started on insulin drip/glucose stabilizer. Follow CBGs and BMP closely. Once at goal, transition to Lantus and SSI. Check A1c. Diabetes coordinator consultation. 2. Dehydration with hyponatremia: Secondary to hyperglycemia. IV fluids. 3. Acute on stage III chronic kidney disease: Baseline creatinine probably in the 1.8 range. Acute renal failure secondary to dehydration, HCTZ and ARB. Hold culprit medications. IV fluids. Follow BMP closely. 4. Essential hypertension: Mildly uncontrolled. Continue carvedilol, clonidine and amlodipine. Hold HCTZ/ARB. Monitor. 5. Hyperlipidemia: Continue statins. 6. History of dizziness/vertigo: When necessary meclizine. 7. History of TIAs: Continue aspirin, statins     Code Status: Full  Family Communication: None at bedside  Disposition Plan: Home when medically stable   Time spent: 60 minutes  Libbi Towner, MD, FACP, FHM. [facility] Pager  [phone]  If 7PM-7AM, please contact night-coverage [email or web address] Password [facility] 11/26/2014, 6:08 PM

## 2014-11-26 NOTE — ED Notes (Signed)
Per EMS, off meds for a month because she was changing MDs-went to new PCP today and CBG read high-patient told EMS she is a bit more thirsty than normal-no increased hunger, urination

## 2014-11-27 DIAGNOSIS — E876 Hypokalemia: Secondary | ICD-10-CM

## 2014-11-27 LAB — GLUCOSE, CAPILLARY
GLUCOSE-CAPILLARY: 146 mg/dL — AB (ref 70–99)
GLUCOSE-CAPILLARY: 205 mg/dL — AB (ref 70–99)
GLUCOSE-CAPILLARY: 405 mg/dL — AB (ref 70–99)
GLUCOSE-CAPILLARY: 88 mg/dL (ref 70–99)
Glucose-Capillary: 111 mg/dL — ABNORMAL HIGH (ref 70–99)
Glucose-Capillary: 129 mg/dL — ABNORMAL HIGH (ref 70–99)
Glucose-Capillary: 169 mg/dL — ABNORMAL HIGH (ref 70–99)
Glucose-Capillary: 227 mg/dL — ABNORMAL HIGH (ref 70–99)
Glucose-Capillary: 282 mg/dL — ABNORMAL HIGH (ref 70–99)
Glucose-Capillary: 292 mg/dL — ABNORMAL HIGH (ref 70–99)
Glucose-Capillary: 313 mg/dL — ABNORMAL HIGH (ref 70–99)
Glucose-Capillary: 351 mg/dL — ABNORMAL HIGH (ref 70–99)
Glucose-Capillary: 356 mg/dL — ABNORMAL HIGH (ref 70–99)
Glucose-Capillary: 508 mg/dL — ABNORMAL HIGH (ref 70–99)
Glucose-Capillary: 97 mg/dL (ref 70–99)

## 2014-11-27 LAB — BASIC METABOLIC PANEL
ANION GAP: 11 (ref 5–15)
BUN: 24 mg/dL — ABNORMAL HIGH (ref 6–23)
CO2: 28 mmol/L (ref 19–32)
Calcium: 8.4 mg/dL (ref 8.4–10.5)
Chloride: 99 mmol/L (ref 96–112)
Creatinine, Ser: 1.81 mg/dL — ABNORMAL HIGH (ref 0.50–1.10)
GFR calc Af Amer: 30 mL/min — ABNORMAL LOW (ref >90)
GFR calc non Af Amer: 26 mL/min — ABNORMAL LOW (ref >90)
Glucose, Bld: 196 mg/dL — ABNORMAL HIGH (ref 70–99)
Potassium: 3.1 mmol/L — ABNORMAL LOW (ref 3.5–5.1)
Sodium: 138 mmol/L (ref 135–145)

## 2014-11-27 MED ORDER — INSULIN GLARGINE 100 UNIT/ML ~~LOC~~ SOLN
10.0000 [IU] | Freq: Once | SUBCUTANEOUS | Status: AC
  Administered 2014-11-27: 10 [IU] via SUBCUTANEOUS
  Filled 2014-11-27: qty 0.1

## 2014-11-27 MED ORDER — POTASSIUM CHLORIDE CRYS ER 20 MEQ PO TBCR
40.0000 meq | EXTENDED_RELEASE_TABLET | Freq: Once | ORAL | Status: AC
  Administered 2014-11-27: 40 meq via ORAL
  Filled 2014-11-27: qty 2

## 2014-11-27 MED ORDER — INSULIN GLARGINE 100 UNIT/ML ~~LOC~~ SOLN
20.0000 [IU] | Freq: Every day | SUBCUTANEOUS | Status: DC
  Administered 2014-11-27: 20 [IU] via SUBCUTANEOUS
  Filled 2014-11-27: qty 0.2

## 2014-11-27 MED ORDER — INSULIN ASPART 100 UNIT/ML ~~LOC~~ SOLN
0.0000 [IU] | Freq: Every day | SUBCUTANEOUS | Status: DC
  Administered 2014-11-27: 5 [IU] via SUBCUTANEOUS

## 2014-11-27 MED ORDER — INSULIN GLARGINE 100 UNIT/ML ~~LOC~~ SOLN
30.0000 [IU] | Freq: Every day | SUBCUTANEOUS | Status: DC
  Filled 2014-11-27: qty 0.3

## 2014-11-27 MED ORDER — INSULIN ASPART 100 UNIT/ML ~~LOC~~ SOLN
0.0000 [IU] | Freq: Three times a day (TID) | SUBCUTANEOUS | Status: DC
  Administered 2014-11-27: 5 [IU] via SUBCUTANEOUS
  Administered 2014-11-27: 7 [IU] via SUBCUTANEOUS
  Administered 2014-11-28: 5 [IU] via SUBCUTANEOUS
  Administered 2014-11-28: 7 [IU] via SUBCUTANEOUS
  Administered 2014-11-28: 5 [IU] via SUBCUTANEOUS
  Administered 2014-11-29: 1 [IU] via SUBCUTANEOUS
  Administered 2014-11-29: 5 [IU] via SUBCUTANEOUS

## 2014-11-27 MED ORDER — INSULIN ASPART 100 UNIT/ML ~~LOC~~ SOLN: 0.0000 [IU] | Freq: Every day | SUBCUTANEOUS | Status: DC

## 2014-11-27 MED ORDER — INSULIN ASPART 100 UNIT/ML ~~LOC~~ SOLN
0.0000 [IU] | Freq: Three times a day (TID) | SUBCUTANEOUS | Status: DC
  Administered 2014-11-27: 7 [IU] via SUBCUTANEOUS

## 2014-11-27 NOTE — Progress Notes (Signed)
Inpatient Diabetes Program Recommendations  AACE/ADA: New Consensus Statement on Inpatient Glycemic Control (2013)  Target Ranges:  Prepandial:   less than 140 mg/dL      Peak postprandial:   less than 180 mg/dL (1-2 hours)      Critically ill patients:  140 - 180 mg/dL   Results for Tammy Huff, Tammy Huff (MRN [id number]) as of 11/27/2014 10:45  Ref. Range 11/26/2014 15:26  Sodium Latest Range: 135-145 mmol/L 126 (L)  Potassium Latest Range: 3.5-5.1 mmol/L 3.6  Chloride Latest Range: 96-112 mmol/L 83 (L)  CO2 Latest Range: 19-32 mmol/L 27  BUN Latest Range: 6-23 mg/dL 32 (H)  Creatinine Latest Range: 0.50-1.10 mg/dL 4.092.24 (H)  Calcium Latest Range: 8.4-10.5 mg/dL 9.0  GFR calc non Af Amer Latest Range: >90 mL/min 20 (L)  GFR calc Af Amer Latest Range: >90 mL/min 23 (L)  Glucose Latest Range: 70-99 mg/dL [id number] (HH)  Anion gap Latest Range: 5-15  16 (H)    74 y.o. female with history of long-standing DM 2/IDDM with renal complications, essential hypertension, hyperlipidemia, chronic kidney disease, was sent from PCPs office due to hyperglycemia. Patient states that she was in a "donut hole" , was transitioning between PCPs and hence stopped taking Lantus after 10/29/14. She stopped taking NovoLog after the first week of January for unclear reasons. She complains of dry mouth and feeling thirsty but denies polyuria, itching, weakness or dizziness. She claims to check her blood sugars twice a day and apparently ranging between 120-135 mg per DL. She is noncompliant with diet. Her insurance finally kicked in the middle of this month and she had a scheduled visit with her new PCP today. At the PCPs office, her blood sugar read greater than 600 and she was sent to the ED for evaluation.   Home DM Meds: Lantus 30 units daily       Novolog 4 units tidwc   Current Insulin Orders: Lantus 30 units QAM      Novolog Sensitive SSI    **Note patient was given 20 units Lantus at 6:45 am today to transition off  IV insulin drip.  Was given an additional 10 units Lantus at 10am today to equal her home total dose of Lantus.   MD- If patient is eating well and you note that her postprandial glucose levels are elevated, please consider adding her home dose of Novolog to in-hospital regimen- Novolog 4 units tidwc (hold if pt NPO, hold if pt eats <50% of meal)   Will follow [name] RN, MSN, CDE Diabetes Coordinator Inpatient Diabetes Program Team Pager: [phone] (8a-10p)

## 2014-11-27 NOTE — Progress Notes (Signed)
PROGRESS NOTE    Tammy KalataGracie C Pralle ZOX:[id number] DOB: 09/29/1941 DOA: 11/26/2014 PCP: Leanor RubensteinSUN,VYVYAN Y, MD  HPI/Brief narrative 74 y.o. female with history of long-standing DM 2/IDDM with renal complications, essential hypertension, hyperlipidemia, chronic kidney disease, was sent from PCPs office due to hyperglycemia. She was noncompliant with her diet and her insulins due to insurance drop "donut hole". In the ED, sodium 126, glucose 788, BUN 32, creatinine 2.24, venous pH 7.388. She was admitted to the stepdown unit for management of HONK.    Assessment/Plan:  1. DM hyperosmolarity type II, uncontrolled: Precipitated by noncompliance to insulins secondary to insurance issues and dietary indiscretion. Treated with aggressive IV fluid hydration and insulin drip. Hyperosmolar state has resolved. Transitioned to Lantus and NovoLog-adjust as needed. Diabetes coordinator consultation. Counseled extensively regarding compliance with diet and medications. 2. Dehydration with hyponatremia: Secondary to hyperglycemia. Resolved. DC IV fluids. 3. Acute on stage III chronic kidney disease: Baseline creatinine probably in the 1.8 range. Acute renal failure secondary to dehydration, HCTZ and ARB. Hold culprit medications. Resolved after IV fluids. Creatinine at baseline. 4. Hypokalemia: Replace and follow BMP. 5. Essential hypertension: controlled. Continue carvedilol, clonidine and amlodipine. Hold HCTZ/ARB. Monitor. 6. Hyperlipidemia: Continue statins. 7. History of dizziness/vertigo: When necessary meclizine. 8. History of TIAs: Continue aspirin, statins   Code Status: Full Family Communication: None at bedside Disposition Plan: Transfer to medical floor. Home possibly 1/31   Consultants:  None  Procedures:  None  Antibiotics:  None   Subjective: Denies complaints. As per nursing, no acute events. Came off insulin drip at 4 AM (had been running at 0 units insulin for 2 hours) and received  Lantus at 6:40 AM.  Objective: Filed Vitals:   11/27/14 0200 11/27/14 0400 11/27/14 0500 11/27/14 0600  BP: 108/47 123/54  114/47  Pulse: 55 57  60  Temp:  97.9 F (36.6 C)    TempSrc:  Oral    Resp: 16 15  16   Height:      Weight:   95.5 kg (210 lb 8.6 oz)   SpO2: 95% 94%  94%    Intake/Output Summary (Last 24 hours) at 11/27/14 0840 Last data filed at 11/27/14 0542  Gross per 24 hour  Intake 1333.37 ml  Output    750 ml  Net 583.37 ml   Filed Weights   11/26/14 1750 11/27/14 0500  Weight: 92.8 kg (204 lb 9.4 oz) 95.5 kg (210 lb 8.6 oz)     Exam:  General exam: Pleasant elderly female, moderately built and obese, lying comfortably supine in bed. Respiratory system: Clear. No increased work of breathing. Cardiovascular system: S1 & S2 heard, RRR. No JVD, murmurs, gallops, clicks or pedal edema. Telemetry: Sinus rhythm. Gastrointestinal system: Abdomen is nondistended, soft and nontender. Normal bowel sounds heard. Central nervous system: Alert and oriented. No focal neurological deficits. Extremities: Symmetric 5 x 5 power.   Data Reviewed: Basic Metabolic Panel:  Recent Labs Lab 11/26/14 1526 11/26/14 1906 11/27/14 0619  NA 126* 135 138  K 3.6 3.3* 3.1*  CL 83* 94* 99  CO2 27 31 28   GLUCOSE 788* 411* 196*  BUN 32* 29* 24*  CREATININE 2.24* 1.97* 1.81*  CALCIUM 9.0 8.7 8.4   Liver Function Tests: No results for input(s): AST, ALT, ALKPHOS, BILITOT, PROT, ALBUMIN in the last 168 hours. No results for input(s): LIPASE, AMYLASE in the last 168 hours. No results for input(s): AMMONIA in the last 168 hours. CBC:  Recent Labs Lab 11/26/14 1526  WBC 4.8  NEUTROABS 2.4  HGB 12.6  HCT 35.5*  MCV 84.1  PLT 272   Cardiac Enzymes: No results for input(s): CKTOTAL, CKMB, CKMBINDEX, TROPONINI in the last 168 hours. BNP (last 3 results) No results for input(s): PROBNP in the last 8760 hours. CBG:  Recent Labs Lab 11/26/14 2138 11/26/14 2241  11/26/14 2338 11/27/14 0044 11/27/14 0154  GLUCAP 282* 205* 146* 111* 97    Recent Results (from the past 240 hour(s))  MRSA PCR Screening     Status: None   Collection Time: 11/26/14  6:33 PM  Result Value Ref Range Status   MRSA by PCR NEGATIVE NEGATIVE Final    Comment:        The GeneXpert MRSA Assay (FDA approved for NASAL specimens only), is one component of a comprehensive MRSA colonization surveillance program. It is not intended to diagnose MRSA infection nor to guide or monitor treatment for MRSA infections.         Studies: No results found.      Scheduled Meds: . amLODipine  10 mg Oral QHS  . aspirin EC  325 mg Oral Daily  . carvedilol  6.25 mg Oral BID WC  . cloNIDine  0.2 mg Oral BID  . enoxaparin (LOVENOX) injection  30 mg Subcutaneous Q24H  . famotidine  10 mg Oral QPM  . insulin aspart  0-5 Units Subcutaneous QHS  . insulin aspart  0-9 Units Subcutaneous TID WC  . insulin glargine  10 Units Subcutaneous Once  . [START ON 11/28/2014] insulin glargine  30 Units Subcutaneous Daily  . potassium chloride  40 mEq Oral Once  . pravastatin  80 mg Oral Daily  . sodium chloride  3 mL Intravenous Q12H   Continuous Infusions:   Principal Problem:   DM hyperosmolarity type II, uncontrolled Active Problems:   HLD (hyperlipidemia)   Essential hypertension   Dehydration   Renal failure, acute on chronic    Time spent: 40 minutes.    Marcellus Scott, MD, FACP, FHM. [facility] Pager [phone]  If 7PM-7AM, please contact night-coverage [email or web address] Password [email or web address] 11/27/2014, 8:40 AM    LOS: 1 day

## 2014-11-28 DIAGNOSIS — N183 Chronic kidney disease, stage 3 (moderate): Secondary | ICD-10-CM

## 2014-11-28 DIAGNOSIS — E1165 Type 2 diabetes mellitus with hyperglycemia: Secondary | ICD-10-CM

## 2014-11-28 DIAGNOSIS — E1129 Type 2 diabetes mellitus with other diabetic kidney complication: Secondary | ICD-10-CM

## 2014-11-28 LAB — BASIC METABOLIC PANEL WITH GFR
Anion gap: 8 (ref 5–15)
BUN: 19 mg/dL (ref 6–23)
CO2: 29 mmol/L (ref 19–32)
Calcium: 8.5 mg/dL (ref 8.4–10.5)
Chloride: 99 mmol/L (ref 96–112)
Creatinine, Ser: 1.81 mg/dL — ABNORMAL HIGH (ref 0.50–1.10)
GFR calc Af Amer: 30 mL/min — ABNORMAL LOW
GFR calc non Af Amer: 26 mL/min — ABNORMAL LOW
Glucose, Bld: 260 mg/dL — ABNORMAL HIGH (ref 70–99)
Potassium: 3.5 mmol/L (ref 3.5–5.1)
Sodium: 136 mmol/L (ref 135–145)

## 2014-11-28 LAB — GLUCOSE, CAPILLARY
GLUCOSE-CAPILLARY: 349 mg/dL — AB (ref 70–99)
GLUCOSE-CAPILLARY: 331 mg/dL — AB (ref 70–99)
GLUCOSE-CAPILLARY: 198 mg/dL — AB (ref 70–99)
Glucose-Capillary: 269 mg/dL — ABNORMAL HIGH (ref 70–99)
Glucose-Capillary: 290 mg/dL — ABNORMAL HIGH (ref 70–99)

## 2014-11-28 MED ORDER — INSULIN ASPART 100 UNIT/ML ~~LOC~~ SOLN
4.0000 [IU] | Freq: Three times a day (TID) | SUBCUTANEOUS | Status: DC
  Administered 2014-11-28 – 2014-11-29 (×5): 4 [IU] via SUBCUTANEOUS

## 2014-11-28 MED ORDER — INSULIN GLARGINE 100 UNIT/ML ~~LOC~~ SOLN
40.0000 [IU] | Freq: Every day | SUBCUTANEOUS | Status: DC
  Administered 2014-11-28 – 2014-11-29 (×2): 40 [IU] via SUBCUTANEOUS
  Filled 2014-11-28 (×2): qty 0.4

## 2014-11-28 NOTE — Progress Notes (Signed)
PROGRESS NOTE    Tammy Huff QMV:[id number] DOB: 01/21/1941 DOA: 11/26/2014 PCP: [name],[name], MD  HPI/Brief narrative 74 y.o. female with history of long-standing DM 2/IDDM with renal complications, essential hypertension, hyperlipidemia, chronic kidney disease, was sent from PCPs office due to hyperglycemia. She was noncompliant with her diet and her insulins due to insurance drop "donut hole". In the ED, sodium 126, glucose 788, BUN 32, creatinine 2.24, venous pH 7.388. She was admitted to the stepdown unit for management of HONK.    Assessment/Plan:  1. DM hyperosmolarity type II, uncontrolled: Precipitated by noncompliance to insulins secondary to insurance issues and dietary indiscretion. Treated with aggressive IV fluid hydration and insulin drip. Hyperosmolar state resolved. Transitioned to Lantus and NovoLog-adjust as needed. Diabetes coordinator consultation. Counseled extensively regarding compliance with diet and medications. CBGs still high/FBG 269. Increase Lantus to 40 units daily, add NovoLog mealtime 4 units. Monitor closely. 2. Dehydration with hyponatremia: Secondary to hyperglycemia. Resolved. DC IV fluids. 3. Acute on stage III chronic kidney disease: Baseline creatinine probably in the 1.8 range. Acute renal failure secondary to dehydration, HCTZ and ARB. Hold culprit medications. Resolved after IV fluids. Creatinine at baseline. 4. Hypokalemia: Replace and follow BMP. 5. Essential hypertension: controlled. Continue carvedilol, clonidine and amlodipine. Hold HCTZ/ARB. Monitor. 6. Hyperlipidemia: Continue statins. 7. History of dizziness/vertigo: When necessary meclizine. 8. History of TIAs: Continue aspirin, statins   Code Status: Full Family Communication: None at bedside Disposition Plan: Transfer to medical floor. Home possibly 2/1   Consultants:  None  Procedures:  None  Antibiotics:  None   Subjective: Denies complaints.   Objective: Filed  Vitals:   11/27/14 2210 11/28/14 0607 11/28/14 0638 11/28/14 1022  BP: 128/73 123/57  154/74  Pulse: 64 58    Temp: 98.5 F (36.9 C) 98.6 F (37 C)    TempSrc: Oral Oral    Resp: 18 18    Height:      Weight:   93.6 kg (206 lb 5.6 oz)   SpO2: 97% 100%      Intake/Output Summary (Last 24 hours) at 11/28/14 1108 Last data filed at 11/28/14 0600  Gross per 24 hour  Intake    480 ml  Output      3 ml  Net    477 ml   Filed Weights   11/26/14 1750 11/27/14 0500 11/28/14 0638  Weight: 92.8 kg (204 lb 9.4 oz) 95.5 kg (210 lb 8.6 oz) 93.6 kg (206 lb 5.6 oz)     Exam:  General exam: Pleasant elderly female, moderately built and obese, lying comfortably supine in bed. Respiratory system: Clear. No increased work of breathing. Cardiovascular system: S1 & S2 heard, RRR. No JVD, murmurs, gallops, clicks or pedal edema.  Gastrointestinal system: Abdomen is nondistended, soft and nontender. Normal bowel sounds heard. Central nervous system: Alert and oriented. No focal neurological deficits. Extremities: Symmetric 5 x 5 power.   Data Reviewed: Basic Metabolic Panel:  Recent Labs Lab 11/26/14 1526 11/26/14 1906 11/27/14 0619 11/28/14 0615  NA 126* 135 138 136  K 3.6 3.3* 3.1* 3.5  CL 83* 94* 99 99  CO2 27 31 28 29   GLUCOSE 788* 411* 196* 260*  BUN 32* 29* 24* 19  CREATININE 2.24* 1.97* 1.81* 1.81*  CALCIUM 9.0 8.7 8.4 8.5   Liver Function Tests: No results for input(s): AST, ALT, ALKPHOS, BILITOT, PROT, ALBUMIN in the last 168 hours. No results for input(s): LIPASE, AMYLASE in the last 168 hours. No results for  input(s): AMMONIA in the last 168 hours. CBC:  Recent Labs Lab 11/26/14 1526  WBC 4.8  NEUTROABS 2.4  HGB 12.6  HCT 35.5*  MCV 84.1  PLT 272   Cardiac Enzymes: No results for input(s): CKTOTAL, CKMB, CKMBINDEX, TROPONINI in the last 168 hours. BNP (last 3 results) No results for input(s): PROBNP in the last 8760 hours. CBG:  Recent Labs Lab  11/27/14 0744 11/27/14 1201 11/27/14 1541 11/27/14 2206 11/28/14 0732  GLUCAP 227* 292* 313* 351* 269*    Recent Results (from the past 240 hour(s))  MRSA PCR Screening     Status: None   Collection Time: 11/26/14  6:33 PM  Result Value Ref Range Status   MRSA by PCR NEGATIVE NEGATIVE Final    Comment:        The GeneXpert MRSA Assay (FDA approved for NASAL specimens only), is one component of a comprehensive MRSA colonization surveillance program. It is not intended to diagnose MRSA infection nor to guide or monitor treatment for MRSA infections.         Studies: No results found.      Scheduled Meds: . amLODipine  10 mg Oral QHS  . aspirin EC  325 mg Oral Daily  . carvedilol  6.25 mg Oral BID WC  . cloNIDine  0.2 mg Oral BID  . enoxaparin (LOVENOX) injection  30 mg Subcutaneous Q24H  . famotidine  10 mg Oral QPM  . insulin aspart  0-5 Units Subcutaneous QHS  . insulin aspart  0-9 Units Subcutaneous TID WC  . insulin aspart  4 Units Subcutaneous TID WC  . insulin glargine  40 Units Subcutaneous Daily  . pravastatin  80 mg Oral Daily  . sodium chloride  3 mL Intravenous Q12H   Continuous Infusions:   Principal Problem:   DM hyperosmolarity type II, uncontrolled Active Problems:   HLD (hyperlipidemia)   Essential hypertension   Dehydration   Renal failure, acute on chronic    Time spent: 30 minutes.    Marcellus Scott, MD, FACP, FHM. [facility] Pager [phone]  If 7PM-7AM, please contact night-coverage [email or web address] Password [email or web address] 11/28/2014, 11:08 AM    LOS: 2 days

## 2014-11-29 LAB — HEMOGLOBIN A1C
HEMOGLOBIN A1C: 12.7 % — AB (ref 4.8–5.6)
Mean Plasma Glucose: 318 mg/dL

## 2014-11-29 LAB — GLUCOSE, CAPILLARY
GLUCOSE-CAPILLARY: 141 mg/dL — AB (ref 70–99)
GLUCOSE-CAPILLARY: 278 mg/dL — AB (ref 70–99)

## 2014-11-29 MED ORDER — INSULIN ASPART 100 UNIT/ML FLEXPEN: 4.0000 [IU] | PEN_INJECTOR | Freq: Three times a day (TID) | SUBCUTANEOUS | Status: DC

## 2014-11-29 MED ORDER — INSULIN GLARGINE 100 UNIT/ML SOLOSTAR PEN: 35.0000 [IU] | PEN_INJECTOR | SUBCUTANEOUS | Status: DC

## 2014-11-29 MED ORDER — POTASSIUM CHLORIDE CRYS ER 20 MEQ PO TBCR
40.0000 meq | EXTENDED_RELEASE_TABLET | Freq: Once | ORAL | Status: AC
  Administered 2014-11-29: 40 meq via ORAL
  Filled 2014-11-29: qty 2

## 2014-11-29 MED ORDER — INSULIN PEN NEEDLE 31G X 8 MM MISC: Status: DC

## 2014-11-29 NOTE — Discharge Instructions (Signed)
Insulin Treatment for Diabetes Diabetes is a disease that does not go away (chronic). It occurs when the body does not properly use the sugar (glucose) that is released from food after it is digested. Glucose levels are controlled by a hormone called insulin, which is made by your pancreas. Depending on the type of diabetes you have, either of the following will apply:   The pancreas does not make any insulin (type 1 diabetes).  The pancreas makes too little insulin, and the body cannot respond normally to the insulin that is made (type 2 diabetes). Without insulin, death can occur. However, with the addition of insulin, blood sugar monitoring, and treatment, someone with diabetes can live a full and productive life. This document will discuss the role of insulin in your treatment and provide information about its use.  HOW IS INSULIN GIVEN? Insulin is a medicine that can only be given by injection. Taking it by mouth makes it inactive because of the acid in your stomach. Insulin is injected under the skin by a syringe and needle, an insulin pen, a pump, or a jet injector. Your dose will be determined by your health care provider based on your individual needs. You will also be given guidance on which method of giving insulin is right for you. Remember that if you give insulin with a needle and syringe, you must do so using only a special insulin syringe made for this purpose. WHERE ON THE BODY SHOULD INSULIN BE INJECTED? Insulin is injected into the fatty layer of tissue just under your skin. Good places to inject insulin include the upper arm, the front and outer area of the thigh, the hips, and the abdomen. Giving your insulin in the abdomen is preferred because this provides the most rapid and consistent absorption. Avoid the area 2 inches (5 cm) around the navel and avoid injecting into areas on your body with scar tissue. In addition, it is important to rotate your injection sites with every shot to  prevent irritation and improve absorption. WHAT ARE THE DIFFERENT TYPES OF INSULIN?  If you have type 1 diabetes, you must take insulin to stay alive. Your body does not produce it. If you have type 2 diabetes, you might require insulin in addition to, or instead of, other medicines. In either case, proper use of insulin is critical to control your diabetes.  There are a number of different types of insulin. Usually, you will give yourself injections, though others can be trained to give them to you. Some people have an insulin pump that delivers insulin continuously through a tube (cannula) that is placed under the skin. Using insulin requires that you check your blood sugar several times a day. The exact number of times and time of day to check will vary depending on your type of diabetes, your type of insulin, and treatment goals. Your health care provider will direct you.  Generally, different insulins have different properties. The following is a general guide. Specifics will vary by product, and new products are introduced periodically.   Rapid-acting insulin starts working quickly (in as little as 5 minutes) and wears off in 4 to 6 hours (sometimes longer). This type of insulin works well when taken just before a meal to bring your blood sugar quickly back to normal.   Short-acting insulin starts working in about 30 minutes and can last 6 to 10 hours. This type of insulin should be taken about 30 minutes before you start eating a meal.  Intermediate-acting insulin starts working in 1-2 hours and wears off after about 10 to 18 hours. This insulin will lower your blood sugar for a longer period of time, but it will not be as effective in lowering your blood sugar right after a meal.   Long-acting insulin mimics the small amount of insulin that your pancreas usually produces throughout the day. You need to have some insulin present at all times. It is crucial to the metabolism of brain cells and  other cells. Long-acting insulin is meant to be used either once or twice a day. It is usually used in combination with other types of insulin, or in combination with other diabetes medicines.  Discuss the type of insulin you are taking with your health care provider or pharmacist. You will then be aware of when the insulin can be expected to peak and when it will wear off. This is important to know so you can plan for meal times and periods of exercise.  Your health care provider will usually have a strategy in mind when treating you with insulin. This will vary with your type of diabetes, your diabetes treatment goals, and your health history. It is important that you understand this strategy so you can be an active partner in treating your diabetes. Here are some terms you might hear:   Basal insulin. This refers to the small amount of insulin that needs to be present in your blood at all times. Sometimes oral medicines will be enough. For other people, and especially for people with type 1 diabetes, insulin is needed. Usually, intermediate-acting or long-acting insulin is used once or twice a day to accomplish this.   Prandial (meal-related) insulin. Your blood sugar will rise rapidly after a meal. Rapid-acting or short-acting insulin can be used right before the meal to bring your blood sugar back to normal quickly. You might be instructed to adjust the amount of insulin depending on how much carbohydrate (starch) is in your meal.   Corrective insulin. You might be instructed to check your blood sugar at certain times of the day. You then might use a small amount of rapid-acting or short-acting insulin to bring the blood sugar down to normal if it is elevated.   Tight control (also called intensive therapy). Tight control means keeping your blood sugar as close to your target as possible and keeping it from going too high after meals. People with tight control of their diabetes are shown to have  fewer long-term problems from their diabetes.   Glycohemoglobin (also called glyco, glycosylated hemoglobin, hemoglobin A1c, or A1c) level. This measures how well your blood sugar has been controlled during the past 1 to 3 months. It helps your health care provider see how effective your treatment is and decide if any changes are needed. Your health care provider will discuss your target glycohemoglobin level with you.  Insulin treatment requires your careful attention. Treatment plans will be different for different people. Some people do well with a simple program. Others require more complicated programs, with multiple insulin injections daily. You will work with your health care provider to develop the best program for you. Regardless of your insulin treatment plan, you must also do your best on weight control, diet and food choices, exercise, blood pressure control, cholesterol control, and stress levels.  WHAT ARE THE SIDE EFFECTS OF INSULIN? Although insulin treatment is important, it does have some side effects, such as:   Insulin can cause your blood sugar to go  too low (hypoglycemia).   Weight gain can occur.   Improper injection technique can cause hypoglycemia, blood sugar to go too high (hyperglycemia), skin injury or irritation, or other problems. You must learn to inject insulin properly. Document Released: 01/11/2009 Document Revised: 03/01/2014 Document Reviewed: 03/29/2013 [facility] Patient Information 2015 [place], [place]. This information is not intended to replace advice given to you by your health care provider. Make sure you discuss any questions you have with your health care provider.  Type 2 Diabetes Mellitus Type 2 diabetes mellitus, often simply referred to as type 2 diabetes, is a long-lasting (chronic) disease. In type 2 diabetes, the pancreas does not make enough insulin (a hormone), the cells are less responsive to the insulin that is made (insulin resistance), or  both. Normally, insulin moves sugars from food into the tissue cells. The tissue cells use the sugars for energy. The lack of insulin or the lack of normal response to insulin causes excess sugars to build up in the blood instead of going into the tissue cells. As a result, high blood sugar (hyperglycemia) develops. The effect of high sugar (glucose) levels can cause many complications. Type 2 diabetes was also previously called adult-onset diabetes, but it can occur at any age.  RISK FACTORS  A person is predisposed to developing type 2 diabetes if someone in the family has the disease and also has one or more of the following primary risk factors:  Overweight.  An inactive lifestyle.  A history of consistently eating high-calorie foods. Maintaining a normal weight and regular physical activity can reduce the chance of developing type 2 diabetes. SYMPTOMS  A person with type 2 diabetes may not show symptoms initially. The symptoms of type 2 diabetes appear slowly. The symptoms include:  Increased thirst (polydipsia).  Increased urination (polyuria).  Increased urination during the night (nocturia).  Weight loss. This weight loss may be rapid.  Frequent, recurring infections.  Tiredness (fatigue).  Weakness.  Vision changes, such as blurred vision.  Fruity smell to your breath.  Abdominal pain.  Nausea or vomiting.  Cuts or bruises which are slow to heal.  Tingling or numbness in the hands or feet. DIAGNOSIS Type 2 diabetes is frequently not diagnosed until complications of diabetes are present. Type 2 diabetes is diagnosed when symptoms or complications are present and when blood glucose levels are increased. Your blood glucose level may be checked by one or more of the following blood tests:  A fasting blood glucose test. You will not be allowed to eat for at least 8 hours before a blood sample is taken.  A random blood glucose test. Your blood glucose is checked at  any time of the day regardless of when you ate.  A hemoglobin A1c blood glucose test. A hemoglobin A1c test provides information about blood glucose control over the previous 3 months.  An oral glucose tolerance test (OGTT). Your blood glucose is measured after you have not eaten (fasted) for 2 hours and then after you drink a glucose-containing beverage. TREATMENT   You may need to take insulin or diabetes medicine daily to keep blood glucose levels in the desired range.  If you use insulin, you may need to adjust the dosage depending on the carbohydrates that you eat with each meal or snack. The treatment goal is to maintain the before meal blood sugar (preprandial glucose) level at 70-130 mg/dL. HOME CARE INSTRUCTIONS   Have your hemoglobin A1c level checked twice a year.  Perform daily blood  glucose monitoring as directed by your health care provider.  Monitor urine ketones when you are ill and as directed by your health care provider.  Take your diabetes medicine or insulin as directed by your health care provider to maintain your blood glucose levels in the desired range.  Never run out of diabetes medicine or insulin. It is needed every day.  If you are using insulin, you may need to adjust the amount of insulin given based on your intake of carbohydrates. Carbohydrates can raise blood glucose levels but need to be included in your diet. Carbohydrates provide vitamins, minerals, and fiber which are an essential part of a healthy diet. Carbohydrates are found in fruits, vegetables, whole grains, dairy products, legumes, and foods containing added sugars.  Eat healthy foods. You should make an appointment to see a registered dietitian to help you create an eating plan that is right for you.  Lose weight if you are overweight.  Carry a medical alert card or wear your medical alert jewelry.  Carry a 15-gram carbohydrate snack with you at all times to treat low blood glucose  (hypoglycemia). Some examples of 15-gram carbohydrate snacks include:  Glucose tablets, 3 or 4.  Glucose gel, 15-gram tube.  Raisins, 2 tablespoons (24 grams).  Jelly beans, 6.  Animal crackers, 8.  Regular pop, 4 ounces (120 mL).  Gummy treats, 9.  Recognize hypoglycemia. Hypoglycemia occurs with blood glucose levels of 70 mg/dL and below. The risk for hypoglycemia increases when fasting or skipping meals, during or after intense exercise, and during sleep. Hypoglycemia symptoms can include:  Tremors or shakes.  Decreased ability to concentrate.  Sweating.  Increased heart rate.  Headache.  Dry mouth.  Hunger.  Irritability.  Anxiety.  Restless sleep.  Altered speech or coordination.  Confusion.  Treat hypoglycemia promptly. If you are alert and able to safely swallow, follow the 15:15 rule:  Take 15-20 grams of rapid-acting glucose or carbohydrate. Rapid-acting options include glucose gel, glucose tablets, or 4 ounces (120 mL) of fruit juice, regular soda, or low-fat milk.  Check your blood glucose level 15 minutes after taking the glucose.  Take 15-20 grams more of glucose if the repeat blood glucose level is still 70 mg/dL or below.  Eat a meal or snack within 1 hour once blood glucose levels return to normal.  Be alert to feeling very thirsty and urinating more frequently than usual, which are early signs of hyperglycemia. An early awareness of hyperglycemia allows for prompt treatment. Treat hyperglycemia as directed by your health care provider.  Engage in at least 150 minutes of moderate-intensity physical activity a week, spread over at least 3 days of the week or as directed by your health care provider. In addition, you should engage in resistance exercise at least 2 times a week or as directed by your health care provider. Try to spend no more than 90 minutes at one time inactive.  Adjust your medicine and food intake as needed if you start a new  exercise or sport.  Follow your sick-day plan anytime you are unable to eat or drink as usual.  Do not use any tobacco products including cigarettes, chewing tobacco, or electronic cigarettes. If you need help quitting, ask your health care provider.  Limit alcohol intake to no more than 1 drink per day for nonpregnant women and 2 drinks per day for men. You should drink alcohol only when you are also eating food. Talk with your health care provider whether alcohol  is safe for you. Tell your health care provider if you drink alcohol several times a week.  Keep all follow-up visits as directed by your health care provider. This is important.  Schedule an eye exam soon after the diagnosis of type 2 diabetes and then annually.  Perform daily skin and foot care. Examine your skin and feet daily for cuts, bruises, redness, nail problems, bleeding, blisters, or sores. A foot exam by a health care provider should be done annually.  Brush your teeth and gums at least twice a day and floss at least once a day. Follow up with your dentist regularly.  Share your diabetes management plan with your workplace or school.  Stay up-to-date with immunizations. It is recommended that people with diabetes who are over 7 years old get the pneumonia vaccine. In some cases, two separate shots may be given. Ask your health care provider if your pneumonia vaccination is up-to-date.  Learn to manage stress.  Obtain ongoing diabetes education and support as needed.  Participate in or seek rehabilitation as needed to maintain or improve independence and quality of life. Request a physical or occupational therapy referral if you are having foot or hand numbness, or difficulties with grooming, dressing, eating, or physical activity. SEEK MEDICAL CARE IF:   You are unable to eat food or drink fluids for more than 6 hours.  You have nausea and vomiting for more than 6 hours.  Your blood glucose level is over 240  mg/dL.  There is a change in mental status.  You develop an additional serious illness.  You have diarrhea for more than 6 hours.  You have been sick or have had a fever for a couple of days and are not getting better.  You have pain during any physical activity.  SEEK IMMEDIATE MEDICAL CARE IF:  You have difficulty breathing.  You have moderate to large ketone levels. MAKE SURE YOU:  Understand these instructions.  Will watch your condition.  Will get help right away if you are not doing well or get worse. Document Released: 10/15/2005 Document Revised: 03/01/2014 Document Reviewed: 05/13/2012 [facility] Patient Information 2015 [place], [place]. This information is not intended to replace advice given to you by your health care provider. Make sure you discuss any questions you have with your health care provider.

## 2014-11-29 NOTE — Discharge Summary (Signed)
Physician Discharge Summary  Tammy Huff ZOX:[id number] DOB: 06/10/1941 DOA: 11/26/2014  PCP: [name],[name], MD  Admit date: 11/26/2014 Discharge date: 11/29/2014  Time spent: Greater than 30 minutes  Recommendations for Outpatient Follow-up:  1. Dr. Asencion Partridgeamille [name], PCP in 3 days with repeat labs (CBC & BMP).  Discharge Diagnoses:  Principal Problem:   DM hyperosmolarity type II, uncontrolled Active Problems:   HLD (hyperlipidemia)   Essential hypertension   Dehydration   Renal failure, acute on chronic   Discharge Condition: Improved & Stable  Diet recommendation: Heart healthy and diabetic diet.  Filed Weights   11/27/14 0500 11/28/14 0638 11/29/14 0608  Weight: 95.5 kg (210 lb 8.6 oz) 93.6 kg (206 lb 5.6 oz) 94 kg (207 lb 3.7 oz)    History of present illness:  74 y.o. female with history of long-standing DM 2/IDDM with renal complications, essential hypertension, hyperlipidemia, chronic kidney disease, was sent from PCPs office due to hyperglycemia. She was noncompliant with her diet and her insulins due to insurance drop "donut hole". In the ED, sodium 126, glucose 788, BUN 32, creatinine 2.24, venous pH 7.388. She was admitted to the stepdown unit for management of HONK.  Hospital Course:   1. DM hyperosmolarity type II, uncontrolled: Precipitated by noncompliance to insulins secondary to insurance issues and dietary indiscretion. Treated with aggressive IV fluid hydration and insulin drip. Hyperosmolar state resolved. Transitioned to Lantus and NovoLog-adjust as needed. Diabetes coordinator consultation. Counseled extensively regarding compliance with diet and medications. Lantus was increased to 40 units daily and mealtime NovoLog 4 units 3 times a day was added. Patient however stated to the diabetes coordinator that she had episodes of hypoglycemia while on prior 40 units of insulin. Hence we'll reduce Lantus to 35 units daily and continue same mealtime NovoLog. Advised  close follow-up with PCP in the next couple of days to monitor and adjust medications as needed. CBGs ranging 141-278 today. Hemoglobin A1c: 12.7 2. Dehydration with hyponatremia: Secondary to hyperglycemia. Resolved. DC IV fluids. 3. Acute on stage III chronic kidney disease: Baseline creatinine probably in the 1.8 range. Acute renal failure secondary to dehydration, HCTZ and ARB. Hold culprit medications. Resolved after IV fluids. Creatinine at baseline. Held HCTZ/ARB until follow-up with ECP with repeat BMP. 4. Hypokalemia: Replaced prior to discharge. 5. Essential hypertension: controlled. Continue carvedilol, clonidine and amlodipine. Held HCTZ/ARB-please see above. Monitor. 6. Hyperlipidemia: Continue statins. 7. History of dizziness/vertigo: When necessary meclizine. 8. History of TIAs: Continue aspirin, statins  Consultations:  Diabetes coordinator  Procedures:  None    Discharge Exam:  Complaints:  Denies complaints.  Filed Vitals:   11/28/14 2135 11/29/14 0608 11/29/14 1024 11/29/14 1416  BP: 138/67 140/62 121/64 132/68  Pulse: 63 66 75 65  Temp: 98.2 F (36.8 C) 98.2 F (36.8 C)  97.9 F (36.6 C)  TempSrc: Oral Oral  Oral  Resp: 18 18  18   Height:      Weight:  94 kg (207 lb 3.7 oz)    SpO2: 98% 100%  99%    General exam: Pleasant elderly female, moderately built and obese, lying comfortably supine in bed. Respiratory system: Clear. No increased work of breathing. Cardiovascular system: S1 & S2 heard, RRR. No JVD, murmurs, gallops, clicks or pedal edema.  Gastrointestinal system: Abdomen is nondistended, soft and nontender. Normal bowel sounds heard. Central nervous system: Alert and oriented. No focal neurological deficits. Extremities: Symmetric 5 x 5 power.  Discharge Instructions      Discharge Instructions  Call MD for:  extreme fatigue    Complete by:  As directed      Call MD for:  persistant dizziness or light-headedness    Complete by:  As  directed      Call MD for:    Complete by:  As directed   High blood sugars.     Diet - low sodium heart healthy    Complete by:  As directed      Diet Carb Modified    Complete by:  As directed      Increase activity slowly    Complete by:  As directed             Medication List    STOP taking these medications        losartan-hydrochlorothiazide 100-25 MG per tablet  Commonly known as:  HYZAAR     scopolamine 1 MG/3DAYS  Commonly known as:  TRANSDERM-SCOP      TAKE these medications        amLODipine 10 MG tablet  Commonly known as:  NORVASC  Take 10 mg by mouth every evening.     aspirin 325 MG EC tablet  Take 1 tablet (325 mg total) by mouth daily.     carvedilol 6.25 MG tablet  Commonly known as:  COREG  Take 6.25 mg by mouth 2 (two) times daily with a meal.     cloNIDine 0.2 MG tablet  Commonly known as:  CATAPRES  Take 0.2 mg by mouth 2 (two) times daily.     diphenhydrAMINE 25 MG tablet  Commonly known as:  BENADRYL  Take 25 mg by mouth daily as needed for allergies.     famotidine 10 MG chewable tablet  Commonly known as:  PEPCID AC  Chew 10 mg by mouth every evening.     GERITOL PO  Take 1 tablet by mouth every morning.     insulin aspart 100 UNIT/ML FlexPen  Commonly known as:  NOVOLOG FLEXPEN  Inject 4 Units into the skin 3 (three) times daily with meals.     Insulin Glargine 100 UNIT/ML Solostar Pen  Commonly known as:  LANTUS SOLOSTAR  Inject 35 Units into the skin every morning.     Insulin Pen Needle 31G X 8 MM Misc  Commonly known as:  RELION PEN NEEDLE 31G/8MM  Use as directed.     meclizine 25 MG tablet  Commonly known as:  ANTIVERT  Take 1 tablet (25 mg total) by mouth 3 (three) times daily as needed.     pravastatin 80 MG tablet  Commonly known as:  PRAVACHOL  Take 80 mg by mouth every morning.       Follow-up Information    Follow up with [name],[name], MD. Schedule an appointment as soon as possible for a visit in 3  days.   Specialty:  Family Medicine   Why:  To be seen with repeat labs (CBC & BMP). Will need adjustment of diabetes medications.   Contact information:   [address] [place] [address] [phone]        The results of significant diagnostics from this hospitalization (including imaging, microbiology, ancillary and laboratory) are listed below for reference.    Significant Diagnostic Studies: No results found.  Microbiology: Recent Results (from the past 240 hour(s))  MRSA PCR Screening     Status: None   Collection Time: 11/26/14  6:33 PM  Result Value Ref Range Status   MRSA by  PCR NEGATIVE NEGATIVE Final    Comment:        The GeneXpert MRSA Assay (FDA approved for NASAL specimens only), is one component of a comprehensive MRSA colonization surveillance program. It is not intended to diagnose MRSA infection nor to guide or monitor treatment for MRSA infections.      Labs: Basic Metabolic Panel:  Recent Labs Lab 11/26/14 1526 11/26/14 1906 11/27/14 0619 11/28/14 0615  NA 126* 135 138 136  K 3.6 3.3* 3.1* 3.5  CL 83* 94* 99 99  CO2 GLUCOSE 788* 411* 196* 260*  BUN 32* 29* 24* 19  CREATININE 2.24* 1.97* 1.81* 1.81*  CALCIUM 9.0 8.7 8.4 8.5   Liver Function Tests: No results for input(s): AST, ALT, ALKPHOS, BILITOT, PROT, ALBUMIN in the last 168 hours. No results for input(s): LIPASE, AMYLASE in the last 168 hours. No results for input(s): AMMONIA in the last 168 hours. CBC:  Recent Labs Lab 11/26/14 1526  WBC 4.8  NEUTROABS 2.4  HGB 12.6  HCT 35.5*  MCV 84.1  PLT 272   Cardiac Enzymes: No results for input(s): CKTOTAL, CKMB, CKMBINDEX, TROPONINI in the last 168 hours. BNP: BNP (last 3 results) No results for input(s): BNP in the last 8760 hours.  ProBNP (last 3 results) No results for input(s): PROBNP in the last 8760 hours.  CBG:  Recent Labs Lab 11/28/14 1632 11/28/14 1749 11/28/14 2135  11/29/14 0724 11/29/14 1140  GLUCAP 349* 331* 198* 141* 278*        Signed:  Marcellus Scott, MD, FACP, FHM. [facility] Pager [phone]  If 7PM-7AM, please contact night-coverage [email or web address] Password [facility] 11/29/2014, 3:39 PM

## 2014-11-29 NOTE — Progress Notes (Signed)
11/29/2014 After conversation with Dr. Waymon AmatoHongalgi, recommend pt to be discharged on: Lantus 35 units QAM and Novolog 4 units tidwc. Check blood sugars 4x/day. RN to reiterate hypoglycemia s/s and treatment. F/U with PCP within the week.  Thank you. Ailene Ardshonda Tayte Childers, RD, LDN, CDE Inpatient Diabetes Coordinator [phone]

## 2014-11-29 NOTE — Progress Notes (Signed)
Inpatient Diabetes Program Recommendations  AACE/ADA: New Consensus Statement on Inpatient Glycemic Control (2013)  Target Ranges:  Prepandial:   less than 140 mg/dL      Peak postprandial:   less than 180 mg/dL (1-2 hours)      Critically ill patients:  140 - 180 mg/dL   Reason for Visit: Hyperglycemia  Diabetes history: DM2 Outpatient Diabetes medications: Lantus 30 units QAM, previously taking Novolog 4 units tidwc. Stopped taking Lantus on 1/1. Current orders for Inpatient glycemic control: Lantus 40 units QAM, Novolog 4 units tidwc + sensitive tidwc and hs  Pt reports having hypoglycemia when taking Novolog 4 units tidwc at home. Had quit taking Novolog back in December to avoid hypoglycemia. Pt states she checks blood sugars 3x/day. Recently switched PCP. Was at MD appt on 1/30 and blood glucose was 788. Anion gap is 16. Started on [employer]abilizer and transitioned to Lantus and Novolog. [facility] the past.  Results for Tammy Huff, Tammy Huff (MRN [id number]) as of 11/29/2014 12:28  Ref. Range 11/28/2014 06:15  Sodium Latest Range: 135-145 mmol/L 136  Potassium Latest Range: 3.5-5.1 mmol/L 3.5  Chloride Latest Range: 96-112 mmol/L 99  CO2 Latest Range: 19-32 mmol/L 29  BUN Latest Range: 6-23 mg/dL 19  Creatinine Latest Range: 0.50-1.10 mg/dL 6.571.81 (H)  Calcium Latest Range: 8.4-10.5 mg/dL 8.5  GFR calc non Af Amer Latest Range: >90 mL/min 26 (L)  GFR calc Af Amer Latest Range: >90 mL/min 30 (L)  Glucose Latest Range: 70-99 mg/dL 846260 (H)  Results for Tammy Huff, Tammy Huff (MRN [id number]) as of 11/29/2014 12:28  Ref. Range 11/28/2014 16:32 11/28/2014 17:49 11/28/2014 21:35 11/29/2014 07:24 11/29/2014 11:40  Glucose-Capillary Latest Range: 70-99 mg/dL 324349 (H) 401331 (H) 027198 (H) 141 (H) 278 (H)  Results for Tammy Huff, Tammy Huff (MRN [id number]) as of 11/29/2014 12:28  Ref. Range 11/26/2014 19:06  Hgb A1c MFr Bld Latest Range: 4.8-5.6 % 12.7 (H)   HgbA1C of 12.7% reveals uncontrolled  blood sugars at home. Pt has not taken Lantus since 10/29/2014. Has insurance now and states she will have no problems getting her meds.  Instructed pt to make f/u appt with PCP within the week. Take blood sugar log to MD office appt.  Pt does not want to take Novolog at home d/t hypoglycemia. Discussed importance of keeping blood sugars in control to prevent long-term complications. Instructed to call MD office if she finds herself without insulin. Pt voiced understanding.  Recommendations: Decrease Lantus to 30 units QAM. Novolog sensitive s/s at lunch and dinner meal. Check blood sugars 4x/day and f/u with PCP. Not interested in diabetes education at this time.  Thank you. Ailene Ardshonda Kanisha Duba, RD, LDN, CDE Inpatient Diabetes Coordinator [phone]

## 2014-11-29 NOTE — Care Management Note (Signed)
    Page 1 of 1   11/29/2014     12:16:45 PM CARE MANAGEMENT NOTE 11/29/2014  Patient:  Tammy Huff,Tammy Huff   Account Number:  [id number]  Date Initiated:  11/29/2014  Documentation initiated by:  Tammy Huff,Tammy Huff  Subjective/Objective Assessment:   74 yo female admitted with elevated glucose, >600. PTA lived at home alone.     Action/Plan:   Home when stable   Anticipated DC Date:  11/29/2014   Anticipated DC Plan:  HOME/SELF CARE      DC Planning Services  CM consult  Medication Assistance      Choice offered to / List presented to:             Status of service:  Completed, signed off Medicare Important Message given?  YES (If response is "NO", the following Medicare IM given date fields will be blank) Date Medicare IM given:  11/29/2014 Medicare IM [facility]:  [facility] Zucker Date Additional Medicare IM given:   Additional Medicare IM given by:    Discharge Disposition:  HOME/SELF CARE  Per UR Regulation:  Reviewed for med. necessity/level of care/duration of stay  If discussed at Long Length of Stay Meetings, dates discussed:    Comments:  11-29-14 Tammy IshiharaSuzanne Kallan Merrick RN CM 1100 Spoke with patient at bedside. States she was unable to afford he insulin d/t being in the "donut hole" with her Medicare medication coverage. She states she is out of the hole now and plans to get her insulin. States can afford the copay and has a script waiting for her at the pharmacy. States he daughter is her support person and will assist her after d/Huff. Plans to f/u with her PCP. No further needs identified.

## 2014-12-08 ENCOUNTER — Other Ambulatory Visit: Payer: Self-pay | Admitting: Nurse Practitioner

## 2014-12-08 DIAGNOSIS — Z1231 Encounter for screening mammogram for malignant neoplasm of breast: Secondary | ICD-10-CM

## 2014-12-13 ENCOUNTER — Ambulatory Visit: Payer: Medicare (Managed Care)

## 2014-12-16 ENCOUNTER — Ambulatory Visit: Payer: Medicare (Managed Care)

## 2015-10-18 ENCOUNTER — Encounter (HOSPITAL_COMMUNITY): Payer: Self-pay | Admitting: Emergency Medicine

## 2015-10-18 ENCOUNTER — Emergency Department (INDEPENDENT_AMBULATORY_CARE_PROVIDER_SITE_OTHER)
Admission: EM | Admit: 2015-10-18 | Discharge: 2015-10-18 | Disposition: A | Discharge status: Home / Self Care | Payer: Medicare (Managed Care) | Source: Home / Self Care | Attending: Family Medicine | Admitting: Family Medicine

## 2015-10-18 DIAGNOSIS — R05 Cough: Secondary | ICD-10-CM

## 2015-10-18 DIAGNOSIS — J3489 Other specified disorders of nose and nasal sinuses: Secondary | ICD-10-CM | POA: Diagnosis not present

## 2015-10-18 DIAGNOSIS — R059 Cough, unspecified: Secondary | ICD-10-CM

## 2015-10-18 DIAGNOSIS — I10 Essential (primary) hypertension: Secondary | ICD-10-CM

## 2015-10-18 MED ORDER — CLONIDINE HCL 0.2 MG PO TABS: 0.2000 mg | ORAL_TABLET | Freq: Two times a day (BID) | ORAL | Status: DC

## 2015-10-18 NOTE — ED Notes (Signed)
Patient took last dose of blood pressure medicine last night .  Patient has a cough that started last night that interrupted her sleep.  Non-productive, no sob.

## 2015-10-18 NOTE — ED Provider Notes (Signed)
CSN: [id number]     Arrival date & time 10/18/15  1303 History   First MD Initiated Contact with Patient 10/18/15 1350     Chief Complaint  Patient presents with  . Medication Refill  . Cough   (Consider location/radiation/quality/duration/timing/severity/associated sxs/prior Treatment) HPI Comments: Pleasant 74 year old female with a complaint of cough since chest today. This is associated with PND. She states her cough is worse when lying supine at nighttime and in the morning when she first gets up. Denies associated shortness of breath, chest pain or other discomfort, fever. She has no history of smoking and has had no history of asthma symptoms in the past several decades. She was told when she was much younger that she may have asthma due to a couple of episodes of minor wheezing. Denies earache, fever, chills, sore throat.   Past Medical History  Diagnosis Date  . Diabetes mellitus without complication (HCC)   . Hypertension   . Arthritis   . Diverticulitis    Past Surgical History  Procedure Laterality Date  . Colon resection     No family history on file. Social History  Substance Use Topics  . Smoking status: Never Smoker   . Smokeless tobacco: None  . Alcohol Use: No   OB History    No data available     Review of Systems  Constitutional: Negative for fever, chills, activity change, appetite change and fatigue.  HENT: Positive for postnasal drip and rhinorrhea. Negative for congestion, facial swelling and sore throat.   Eyes: Negative.   Respiratory: Negative for shortness of breath and wheezing.        Rare cough  Cardiovascular: Negative.  Negative for chest pain, palpitations and leg swelling.  Gastrointestinal: Negative.   Genitourinary: Negative.   Musculoskeletal: Negative.  Negative for neck pain and neck stiffness.  Skin: Negative for pallor and rash.  Neurological: Negative.     Allergies  Review of patient's allergies indicates no known  allergies.  Home Medications   Prior to Admission medications   Medication Sig Start Date End Date Taking? Authorizing Provider  amLODipine (NORVASC) 10 MG tablet Take 10 mg by mouth every evening.    Historical Provider, MD  aspirin EC 325 MG EC tablet Take 1 tablet (325 mg total) by mouth daily. 08/12/14   Richarda OverlieNayana Abrol, MD  carvedilol (COREG) 6.25 MG tablet Take 6.25 mg by mouth 2 (two) times daily with a meal.    Historical Provider, MD  cloNIDine (CATAPRES) 0.2 MG tablet Take 0.2 mg by mouth 2 (two) times daily.    Historical Provider, MD  diphenhydrAMINE (BENADRYL) 25 MG tablet Take 25 mg by mouth daily as needed for allergies.     Historical Provider, MD  famotidine (PEPCID AC) 10 MG chewable tablet Chew 10 mg by mouth every evening.     Historical Provider, MD  insulin aspart (NOVOLOG FLEXPEN) 100 UNIT/ML FlexPen Inject 4 Units into the skin 3 (three) times daily with meals. 11/29/14   Elease EtienneAnand D Hongalgi, MD  Insulin Glargine (LANTUS SOLOSTAR) 100 UNIT/ML Solostar Pen Inject 35 Units into the skin every morning. 11/29/14   Elease EtienneAnand D Hongalgi, MD  Insulin Pen Needle (RELION PEN NEEDLE 31G/8MM) 31G X 8 MM MISC Use as directed. 11/29/14   Elease EtienneAnand D Hongalgi, MD  Iron-Vitamins (GERITOL PO) Take 1 tablet by mouth every morning.    Historical Provider, MD  meclizine (ANTIVERT) 25 MG tablet Take 1 tablet (25 mg total) by mouth 3 (three) times daily  as needed. 08/12/14   Richarda Overlie, MD  pravastatin (PRAVACHOL) 80 MG tablet Take 80 mg by mouth every morning.    Historical Provider, MD   Meds Ordered and Administered this Visit  Medications - No data to display  BP 128/70 mmHg  Pulse 90  Temp(Src) 98.2 F (36.8 C) (Oral)  Resp 18  SpO2 98% No data found.   Physical Exam  Constitutional: She is oriented to person, place, and time. She appears well-developed and well-nourished. No distress.  HENT:  Mouth/Throat: No oropharyngeal exudate.  Bilateral TMs are normal Oropharynx with minor erythema,  cobblestoning and clear PND. No exudates. Airway widely patent.  Eyes: Conjunctivae and EOM are normal.  Neck: Normal range of motion. Neck supple.  Cardiovascular: Normal rate, regular rhythm and normal heart sounds.   Pulmonary/Chest: Effort normal and breath sounds normal. No respiratory distress. She has no wheezes. She has no rales.  Musculoskeletal: Normal range of motion. She exhibits no edema.  Lymphadenopathy:    She has no cervical adenopathy.  Neurological: She is alert and oriented to person, place, and time.  Skin: Skin is warm and dry. No rash noted.  Psychiatric: She has a normal mood and affect.  Nursing note and vitals reviewed.   ED Course  Procedures (including critical care time)  Labs Review Labs Reviewed - No data to display  Imaging Review No results found.   Visual Acuity Review  Right Eye Distance:   Left Eye Distance:   Bilateral Distance:    Right Eye Near:   Left Eye Near:    Bilateral Near:         MDM   1. Cough   2. Sinus drainage    Chlorpheniramine 2 mg and dextromethorphan 15 mg by mouth every 4 hours when necessary cough and drainage. Follow with your PCP as needed. Refill clonidine 0.2 mg twice a day.    Hayden Rasmussen, NP 10/18/15 1422  Hayden Rasmussen, NP 10/18/15 [phone]

## 2015-10-18 NOTE — Discharge Instructions (Signed)
Cough, Adult  Chlor-trimeton 2 mg and dextromethorphan 15 mg for cough   Coughing is a reflex that clears your throat and your airways. Coughing helps to heal and protect your lungs. It is normal to cough occasionally, but a cough that happens with other symptoms or lasts a long time may be a sign of a condition that needs treatment. A cough may last only 2-3 weeks (acute), or it may last longer than 8 weeks (chronic). CAUSES Coughing is commonly caused by:  Breathing in substances that irritate your lungs.  A viral or bacterial respiratory infection.  Allergies.  Asthma.  Postnasal drip.  Smoking.  Acid backing up from the stomach into the esophagus (gastroesophageal reflux).  Certain medicines.  Chronic lung problems, including COPD (or rarely, lung cancer).  Other medical conditions such as heart failure. HOME CARE INSTRUCTIONS  Pay attention to any changes in your symptoms. Take these actions to help with your discomfort:  Take medicines only as told by your health care provider.  If you were prescribed an antibiotic medicine, take it as told by your health care provider. Do not stop taking the antibiotic even if you start to feel better.  Talk with your health care provider before you take a cough suppressant medicine.  Drink enough fluid to keep your urine clear or pale yellow.  If the air is dry, use a cold steam vaporizer or humidifier in your bedroom or your home to help loosen secretions.  Avoid anything that causes you to cough at work or at home.  If your cough is worse at night, try sleeping in a semi-upright position.  Avoid cigarette smoke. If you smoke, quit smoking. If you need help quitting, ask your health care provider.  Avoid caffeine.  Avoid alcohol.  Rest as needed. SEEK MEDICAL CARE IF:   You have new symptoms.  You cough up pus.  Your cough does not get better after 2-3 weeks, or your cough gets worse.  You cannot control your cough  with suppressant medicines and you are losing sleep.  You develop pain that is getting worse or pain that is not controlled with pain medicines.  You have a fever.  You have unexplained weight loss.  You have night sweats. SEEK IMMEDIATE MEDICAL CARE IF:  You cough up blood.  You have difficulty breathing.  Your heartbeat is very fast.   This information is not intended to replace advice given to you by your health care provider. Make sure you discuss any questions you have with your health care provider.   Document Released: 04/13/2011 Document Revised: 07/06/2015 Document Reviewed: 12/22/2014 Elsevier Interactive Patient Education [employer]2016 Elsevier Inc.

## 2015-10-18 NOTE — ED Notes (Signed)
Patient requested a change in pharmacy.  called [facility] to cancel e-scribed medicine.  Called [facility]/ [address] and left message with prescription.  Called twice to speak to staff, call rang over to automated system both times.

## 2015-11-23 ENCOUNTER — Encounter (HOSPITAL_COMMUNITY): Payer: Self-pay | Admitting: *Deleted

## 2015-11-23 ENCOUNTER — Emergency Department (HOSPITAL_COMMUNITY): Payer: Medicare Other

## 2015-11-23 ENCOUNTER — Emergency Department (HOSPITAL_COMMUNITY)
Admission: EM | Admit: 2015-11-23 | Discharge: 2015-11-23 | Disposition: A | Discharge status: Home / Self Care | Payer: Medicare Other | Attending: Emergency Medicine | Admitting: Emergency Medicine

## 2015-11-23 DIAGNOSIS — R05 Cough: Secondary | ICD-10-CM | POA: Diagnosis present

## 2015-11-23 DIAGNOSIS — R0602 Shortness of breath: Secondary | ICD-10-CM | POA: Diagnosis not present

## 2015-11-23 DIAGNOSIS — Z7982 Long term (current) use of aspirin: Secondary | ICD-10-CM | POA: Diagnosis not present

## 2015-11-23 DIAGNOSIS — I10 Essential (primary) hypertension: Secondary | ICD-10-CM | POA: Insufficient documentation

## 2015-11-23 DIAGNOSIS — J069 Acute upper respiratory infection, unspecified: Secondary | ICD-10-CM | POA: Diagnosis not present

## 2015-11-23 DIAGNOSIS — Z794 Long term (current) use of insulin: Secondary | ICD-10-CM | POA: Diagnosis not present

## 2015-11-23 DIAGNOSIS — Z79899 Other long term (current) drug therapy: Secondary | ICD-10-CM | POA: Insufficient documentation

## 2015-11-23 DIAGNOSIS — E119 Type 2 diabetes mellitus without complications: Secondary | ICD-10-CM | POA: Insufficient documentation

## 2015-11-23 LAB — CBG MONITORING, ED: GLUCOSE-CAPILLARY: 210 mg/dL — AB (ref 65–99)

## 2015-11-23 MED ORDER — PRAVASTATIN SODIUM 80 MG PO TABS: 80.0000 mg | ORAL_TABLET | ORAL | Status: DC

## 2015-11-23 MED ORDER — CLONIDINE HCL 0.2 MG PO TABS: 0.2000 mg | ORAL_TABLET | Freq: Two times a day (BID) | ORAL | Status: DC

## 2015-11-23 MED ORDER — IPRATROPIUM BROMIDE 0.03 % NA SOLN: 2.0000 | Freq: Two times a day (BID) | NASAL | Status: DC

## 2015-11-23 MED ORDER — BENZONATATE 100 MG PO CAPS: 100.0000 mg | ORAL_CAPSULE | Freq: Three times a day (TID) | ORAL | Status: DC | PRN

## 2015-11-23 MED ORDER — ACETAMINOPHEN 325 MG PO TABS
650.0000 mg | ORAL_TABLET | Freq: Once | ORAL | Status: AC
  Administered 2015-11-23: 650 mg via ORAL
  Filled 2015-11-23: qty 2

## 2015-11-23 NOTE — Discharge Instructions (Signed)
Upper Respiratory Infection, Adult Most upper respiratory infections (URIs) are a viral infection of the air passages leading to the lungs. A URI affects the nose, throat, and upper air passages. The most common type of URI is nasopharyngitis and is typically referred to as "the common cold." URIs run their course and usually go away on their own. Most of the time, a URI does not require medical attention, but sometimes a bacterial infection in the upper airways can follow a viral infection. This is called a secondary infection. Sinus and middle ear infections are common types of secondary upper respiratory infections. Bacterial pneumonia can also complicate a URI. A URI can worsen asthma and chronic obstructive pulmonary disease (COPD). Sometimes, these complications can require emergency medical care and may be life threatening.  CAUSES Almost all URIs are caused by viruses. A virus is a type of germ and can spread from one person to another.  RISKS FACTORS You may be at risk for a URI if:   You smoke.   You have chronic heart or lung disease.  You have a weakened defense (immune) system.   You are very young or very old.   You have nasal allergies or asthma.  You work in crowded or poorly ventilated areas.  You work in health care facilities or schools. SIGNS AND SYMPTOMS  Symptoms typically develop 2-3 days after you come in contact with a cold virus. Most viral URIs last 7-10 days. However, viral URIs from the influenza virus (flu virus) can last 14-18 days and are typically more severe. Symptoms may include:   Runny or stuffy (congested) nose.   Sneezing.   Cough.   Sore throat.   Headache.   Fatigue.   Fever.   Loss of appetite.   Pain in your forehead, behind your eyes, and over your cheekbones (sinus pain).  Muscle aches.  DIAGNOSIS  Your health care provider may diagnose a URI by:  Physical exam.  Tests to check that your symptoms are not due to  another condition such as:  Strep throat.  Sinusitis.  Pneumonia.  Asthma. TREATMENT  A URI goes away on its own with time. It cannot be cured with medicines, but medicines may be prescribed or recommended to relieve symptoms. Medicines may help:  Reduce your fever.  Reduce your cough.  Relieve nasal congestion. HOME CARE INSTRUCTIONS   Take medicines only as directed by your health care provider.   Gargle warm saltwater or take cough drops to comfort your throat as directed by your health care provider.  Use a warm mist humidifier or inhale steam from a shower to increase air moisture. This may make it easier to breathe.  Drink enough fluid to keep your urine clear or pale yellow.   Eat soups and other clear broths and maintain good nutrition.   Rest as needed.   Return to work when your temperature has returned to normal or as your health care provider advises. You may need to stay home longer to avoid infecting others. You can also use a face mask and careful hand washing to prevent spread of the virus.  Increase the usage of your inhaler if you have asthma.   Do not use any tobacco products, including cigarettes, chewing tobacco, or electronic cigarettes. If you need help quitting, ask your health care provider. PREVENTION  The best way to protect yourself from getting a cold is to practice good hygiene.   Avoid oral or hand contact with people with cold   symptoms.   Wash your hands often if contact occurs.  There is no clear evidence that vitamin C, vitamin E, echinacea, or exercise reduces the chance of developing a cold. However, it is always recommended to get plenty of rest, exercise, and practice good nutrition.  SEEK MEDICAL CARE IF:   You are getting worse rather than better.   Your symptoms are not controlled by medicine.   You have chills.  You have worsening shortness of breath.  You have brown or red mucus.  You have yellow or brown nasal  discharge.  You have pain in your face, especially when you bend forward.  You have a fever.  You have swollen neck glands.  You have pain while swallowing.  You have white areas in the back of your throat. SEEK IMMEDIATE MEDICAL CARE IF:   You have severe or persistent:  Headache.  Ear pain.  Sinus pain.  Chest pain.  You have chronic lung disease and any of the following:  Wheezing.  Prolonged cough.  Coughing up blood.  A change in your usual mucus.  You have a stiff neck.  You have changes in your:  Vision.  Hearing.  Thinking.  Mood. MAKE SURE YOU:   Understand these instructions.  Will watch your condition.  Will get help right away if you are not doing well or get worse.   This information is not intended to replace advice given to you by your health care provider. Make sure you discuss any questions you have with your health care provider.   Document Released: 04/10/2001 Document Revised: 03/01/2015 Document Reviewed: 01/20/2014 Elsevier Interactive Patient Education 2016 [employer]  

## 2015-11-23 NOTE — ED Provider Notes (Signed)
CSN: [id number]     Arrival date & time 11/23/15  4098 History   First MD Initiated Contact with Patient 11/23/15 [phone]     Chief Complaint  Patient presents with  . URI     (Consider location/radiation/quality/duration/timing/severity/associated sxs/prior Treatment) Patient is a 75 y.o. female presenting with URI.  URI Presenting symptoms: congestion, cough (clear productive) and rhinorrhea   Presenting symptoms: no fever and no sore throat   Duration:  2 days Timing:  Constant Progression:  Unchanged Chronicity:  New Relieved by:  Nothing Worsened by:  Nothing tried Ineffective treatments:  None tried Associated symptoms: no headaches and no neck pain   Risk factors: being elderly and diabetes mellitus     Past Medical History  Diagnosis Date  . Diabetes mellitus without complication (HCC)   . Hypertension   . Arthritis   . Diverticulitis    Past Surgical History  Procedure Laterality Date  . Colon resection     No family history on file. Social History  Substance Use Topics  . Smoking status: Never Smoker   . Smokeless tobacco: None  . Alcohol Use: No   OB History    No data available     Review of Systems  Constitutional: Positive for chills. Negative for fever.  HENT: Positive for congestion and rhinorrhea. Negative for sore throat.   Eyes: Negative for visual disturbance.  Respiratory: Positive for cough (clear productive) and shortness of breath.   Cardiovascular: Negative for chest pain.  Gastrointestinal: Negative for nausea, vomiting, abdominal pain, diarrhea and constipation.  Genitourinary: Negative for difficulty urinating.  Musculoskeletal: Negative for back pain and neck pain.  Skin: Negative for rash.  Neurological: Negative for syncope and headaches.      Allergies  Review of patient's allergies indicates no known allergies.  Home Medications   Prior to Admission medications   Medication Sig Start Date End Date Taking? Authorizing  Provider  amLODipine (NORVASC) 10 MG tablet Take 10 mg by mouth every evening.   Yes Historical Provider, MD  aspirin EC 325 MG EC tablet Take 1 tablet (325 mg total) by mouth daily. 08/12/14  Yes Richarda Overlie, MD  carvedilol (COREG) 6.25 MG tablet Take 6.25 mg by mouth 2 (two) times daily with a meal.   Yes Historical Provider, MD  diphenhydrAMINE (BENADRYL) 25 MG tablet Take 25 mg by mouth daily as needed for allergies.    Yes Historical Provider, MD  famotidine (PEPCID AC) 10 MG chewable tablet Chew 10 mg by mouth every morning.    Yes Historical Provider, MD  insulin aspart (NOVOLOG FLEXPEN) 100 UNIT/ML FlexPen Inject 4 Units into the skin 3 (three) times daily with meals. 11/29/14  Yes Elease Etienne, MD  Insulin Glargine (LANTUS SOLOSTAR) 100 UNIT/ML Solostar Pen Inject 35 Units into the skin every morning. Patient taking differently: Inject 17-18 Units into the skin 2 (two) times daily. 18 units in the morning, 17 units in the evening 11/29/14  Yes Elease Etienne, MD  Insulin Pen Needle (RELION PEN NEEDLE 31G/8MM) 31G X 8 MM MISC Use as directed. 11/29/14  Yes Elease Etienne, MD  Iron-Vitamins (GERITOL PO) Take 1 tablet by mouth every morning.   Yes Historical Provider, MD  meclizine (ANTIVERT) 25 MG tablet Take 1 tablet (25 mg total) by mouth 3 (three) times daily as needed. 08/12/14  Yes Richarda Overlie, MD  benzonatate (TESSALON) 100 MG capsule Take 1 capsule (100 mg total) by mouth 3 (three) times daily as needed  for cough. 11/23/15   Alvira Monday, MD  cloNIDine (CATAPRES) 0.2 MG tablet Take 1 tablet (0.2 mg total) by mouth 2 (two) times daily. 11/23/15   Alvira Monday, MD  ipratropium (ATROVENT) 0.03 % nasal spray Place 2 sprays into both nostrils every 12 (twelve) hours. 11/23/15   Alvira Monday, MD  pravastatin (PRAVACHOL) 80 MG tablet Take 1 tablet (80 mg total) by mouth every morning. 11/23/15   Alvira Monday, MD   BP 177/82 mmHg  Pulse 76  Temp(Src) 98.7 F (37.1 C) (Oral)   Resp 18  SpO2 100% Physical Exam  Constitutional: She is oriented to person, place, and time. She appears well-developed and well-nourished. No distress.  HENT:  Head: Normocephalic and atraumatic.  Right Ear: Tympanic membrane is not injected.  Left Ear: Tympanic membrane is not injected.  Mouth/Throat: Oropharynx is clear and moist. No oropharyngeal exudate.  Eyes: Conjunctivae and EOM are normal.  Neck: Normal range of motion.  Cardiovascular: Normal rate, regular rhythm, normal heart sounds and intact distal pulses.  Exam reveals no gallop and no friction rub.   No murmur heard. Pulmonary/Chest: Effort normal and breath sounds normal. No respiratory distress. She has no wheezes. She has no rales.  Abdominal: Soft. She exhibits no distension. There is no tenderness. There is no guarding.  Musculoskeletal: She exhibits no edema or tenderness.  Neurological: She is alert and oriented to person, place, and time.  Skin: Skin is warm and dry. No rash noted. She is not diaphoretic. No erythema.  Nursing note and vitals reviewed.   ED Course  Procedures (including critical care time) Labs Review Labs Reviewed  CBG MONITORING, ED - Abnormal; Notable for the following:    Glucose-Capillary 210 (*)    All other components within normal limits    Imaging Review Dg Chest 2 View  11/23/2015  CLINICAL DATA:  Chest cold, cough, and cold chills for 1 week, history diabetes mellitus, hypertension EXAM: CHEST  2 VIEW COMPARISON:  08/12/2014 FINDINGS: Minimal enlargement of cardiac silhouette. Mild tortuosity of thoracic aorta. Upper normal size of LEFT hilum, stable. Mediastinal contours and pulmonary vascularity otherwise normal. Central peribronchial thickening, chronic. No pulmonary infiltrate, pleural effusion or pneumothorax. Bony demineralization with scattered endplate spur formation thoracolumbar spine. IMPRESSION: Minimal enlargement of cardiac silhouette. Mild chronic bronchitic changes  without infiltrate. Electronically Signed   By: Ulyses Southward M.D.   On: 11/23/2015 10:39   I have personally reviewed and evaluated these images and lab results as part of my medical decision-making.   EKG Interpretation None      MDM   Final diagnoses:  URI (upper respiratory infection)   75yo female with history of DM, htn, presents with concern for cough, nasal congestion for 2 days. Patient well appearing with normal vital signs. Mild hyperglycemia. XR shows no pneumonia. Do not feel additional labs will change course of care.  Gave rx for tessalon pearls, atrovent nasal spray for symptomatic treatment of viral URI. Discussed reasons to return to the emergency department in detail. Patient discharged in stable condition with understanding of reasons to return.    Alvira Monday, MD 11/23/15 2215

## 2015-11-23 NOTE — ED Notes (Signed)
Pt c/o productive cough originally but non productive cough now, congestion, and chills that began Saturday.

## 2015-12-26 ENCOUNTER — Ambulatory Visit (INDEPENDENT_AMBULATORY_CARE_PROVIDER_SITE_OTHER): Payer: Medicare Other | Admitting: Family Medicine

## 2015-12-26 ENCOUNTER — Encounter: Payer: Self-pay | Admitting: Family Medicine

## 2015-12-26 VITALS — BP 162/82 | HR 68 | Ht 62.0 in | Wt 208.0 lb

## 2015-12-26 DIAGNOSIS — Z23 Encounter for immunization: Secondary | ICD-10-CM | POA: Diagnosis not present

## 2015-12-26 DIAGNOSIS — E119 Type 2 diabetes mellitus without complications: Secondary | ICD-10-CM | POA: Insufficient documentation

## 2015-12-26 DIAGNOSIS — R809 Proteinuria, unspecified: Secondary | ICD-10-CM | POA: Diagnosis not present

## 2015-12-26 DIAGNOSIS — I10 Essential (primary) hypertension: Secondary | ICD-10-CM

## 2015-12-26 DIAGNOSIS — E785 Hyperlipidemia, unspecified: Secondary | ICD-10-CM

## 2015-12-26 LAB — COMPLETE METABOLIC PANEL WITH GFR
ALBUMIN: 3.8 g/dL (ref 3.6–5.1)
ALK PHOS: 82 U/L (ref 33–130)
ALT: 8 U/L (ref 6–29)
AST: 17 U/L (ref 10–35)
BUN: 19 mg/dL (ref 7–25)
CO2: 26 mmol/L (ref 20–31)
Calcium: 8.9 mg/dL (ref 8.6–10.4)
Chloride: 100 mmol/L (ref 98–110)
Creat: 1.71 mg/dL — ABNORMAL HIGH (ref 0.60–0.93)
GFR, Est African American: 33 mL/min — ABNORMAL LOW (ref >=60)
GFR, Est Non African American: 29 mL/min — ABNORMAL LOW (ref >=60)
Glucose, Bld: 255 mg/dL — ABNORMAL HIGH (ref 65–99)
POTASSIUM: 4.1 mmol/L (ref 3.5–5.3)
Sodium: 136 mmol/L (ref 135–146)
Total Bilirubin: 0.5 mg/dL (ref 0.2–1.2)
Total Protein: 7.1 g/dL (ref 6.1–8.1)

## 2015-12-26 LAB — CBC WITH DIFFERENTIAL/PLATELET
Basophils Absolute: 0 10*3/uL (ref 0.0–0.1)
Basophils Relative: 0 % (ref 0–1)
EOS PCT: 3 % (ref 0–5)
Eosinophils Absolute: 0.2 10*3/uL (ref 0.0–0.7)
HEMATOCRIT: 33.1 % — AB (ref 36.0–46.0)
HEMOGLOBIN: 10.7 g/dL — AB (ref 12.0–15.0)
LYMPHS PCT: 39 % (ref 12–46)
Lymphs Abs: 2.8 10*3/uL (ref 0.7–4.0)
MCH: 30 pg (ref 26.0–34.0)
MCHC: 32.3 g/dL (ref 30.0–36.0)
MCV: 92.7 fL (ref 78.0–100.0)
MONO ABS: 0.5 10*3/uL (ref 0.1–1.0)
MONOS PCT: 7 % (ref 3–12)
MPV: 9.5 fL (ref 8.6–12.4)
NEUTROS ABS: 3.7 10*3/uL (ref 1.7–7.7)
Neutrophils Relative %: 51 % (ref 43–77)
Platelets: 253 10*3/uL (ref 150–400)
RBC: 3.57 MIL/uL — AB (ref 3.87–5.11)
RDW: 15.2 % (ref 11.5–15.5)
WBC: 7.3 10*3/uL (ref 4.0–10.5)

## 2015-12-26 LAB — POCT URINALYSIS DIP (DEVICE)
Bilirubin Urine: NEGATIVE
GLUCOSE, UA: 250 mg/dL — AB
KETONES UR: NEGATIVE mg/dL
LEUKOCYTES UA: NEGATIVE
Nitrite: NEGATIVE
SPECIFIC GRAVITY, URINE: 1.025 (ref 1.005–1.030)
UROBILINOGEN UA: 0.2 mg/dL (ref 0.0–1.0)
pH: 6.5 (ref 5.0–8.0)

## 2015-12-26 NOTE — Progress Notes (Signed)
Subjective:    Patient ID: Tammy Huff, female    DOB: July 03, 1941, 75 y.o.   MRN: [id number]  HPI  Ms. Tammy Huff, a 75 year old female with a history of hypertension, diabetes mellitus type II, and hyperlipidemia presents to establish care. Ms. Tammy Huff states that she was a patient of Harrietta Guardian, NP  at [facility]. She states that she is tranferring care due to insurance constraints. She maintains that she has been taking all medications consistently. Patient presents for a follow up of hypertension. She follows a low sodium diet. She says that she does not check blood pressures at home. She remains active, drives, and does her own shopping. Ms. Tammy Huff states that she lives alone and does not require any assistance at this juncture.. Patient denies chest pain, dyspnea, fatigue, lower extremity edema, palpitations and tachypnea.  Cardiovascular risk factors include  obesity (BMI >= 30 kg/m2).  Ms. Tammy Huff has type 2 diabetes and is insulin dependent. She states that she checks blood sugars prior to meals and at night. She did not bring glucometer to appointment and does not recall daily averages.    Symptoms have been well-controlled. Patient denies foot ulcerations, increase appetite, nausea, polydipsia, polyuria, visual disturbances and weight loss.   Past Medical History  Diagnosis Date  . Diabetes mellitus without complication (HCC)   . Hypertension   . Arthritis   . Diverticulitis    Past Surgical History  Procedure Laterality Date  . Colon resection    No Known Allergies  Social History   Social History  . Marital Status: Widowed    Spouse Name: N/A  . Number of Children: N/A  . Years of Education: N/A   Occupational History  . Not on file.   Social History Main Topics  . Smoking status: Never Smoker   . Smokeless tobacco: Not on file  . Alcohol Use: No  . Drug Use: Not on file  . Sexual Activity: Not on file   Other Topics Concern  . Not on file    Social History Narrative   Review of Systems  Constitutional: Negative.  Negative for fever, fatigue and unexpected weight change.  HENT: Negative.   Eyes: Negative.  Negative for photophobia and visual disturbance.  Respiratory: Negative.  Negative for shortness of breath.   Cardiovascular: Negative.   Gastrointestinal: Negative.  Negative for abdominal pain and abdominal distention.  Endocrine: Negative.  Negative for polydipsia, polyphagia and polyuria.  Genitourinary: Negative.   Musculoskeletal: Negative.  Negative for arthralgias.  Skin: Negative.   Allergic/Immunologic: Negative.  Negative for immunocompromised state.  Neurological: Negative.  Negative for dizziness.  Hematological: Negative.  Does not bruise/bleed easily.  Psychiatric/Behavioral: Negative.        Objective:   Physical Exam  Constitutional: She is oriented to person, place, and time. She appears well-developed and well-nourished.  HENT:  Head: Normocephalic and atraumatic.  Right Ear: External ear normal. Decreased hearing (Right hearing aid) is noted.  Left Ear: External ear normal.  Nose: Nose normal.  Mouth/Throat: Oropharynx is clear and moist.  Eyes: Conjunctivae, EOM and lids are normal. Pupils are equal, round, and reactive to light.  Neck: Normal range of motion. Neck supple.  Cardiovascular: Normal rate, regular rhythm, S1 normal, S2 normal, normal heart sounds, intact distal pulses and normal pulses.   Pulmonary/Chest: Effort normal and breath sounds normal.  Abdominal: Soft. Bowel sounds are normal.  Musculoskeletal: Normal range of motion.  Neurological: She is alert and  oriented to person, place, and time. She has normal reflexes.  Skin: Skin is warm and dry.  Psychiatric: She has a normal mood and affect. Her behavior is normal. Judgment and thought content normal.      BP 162/82 mmHg  Pulse 68  Ht  (1.575 m)  Wt 208 lb (94.348 kg)  BMI 38.03 kg/m2 Assessment & Plan:  1.  Essential hypertension Blood pressure is above goal on current medication regimen. Will review medical records as they become available. Will also check for proteinuria and GFR.  - POCT urinalysis dipstick - COMPLETE METABOLIC PANEL WITH GFR  2. Type 2 diabetes mellitus without complication, unspecified long term insulin use status (HCC) Will review labs prior to adjusting medications further. Patient has an eye appointment scheduled with Dr. Betsey Holiday, ophthalmologist on 12/30/2015. She also reports a history of glaucoma.  - COMPLETE METABOLIC PANEL WITH GFR - Hemoglobin A1c - CBC with Differential  3. HLD (hyperlipidemia) - Lipid Panel; Future  4. Proteinuria - Microalbumin/Creatinine Ratio, Urine  5. Immunization due - Pneumococcal conjugate vaccine 13-valent    Routine Health Maintenance:  Patient is up to date on currently up to date with mammograms Up to date with immunizations Will return for a medicare wellness visit Will review medical records as they become available   RTC: Will follow up on 12/30/2015 for a fasting lipid panel, will follow up in 3 months for chronic conditions   Fairy Ashlock M, FNP

## 2015-12-26 NOTE — Patient Instructions (Signed)
Diabetes Mellitus and Food It is important for you to manage your blood sugar (glucose) level. Your blood glucose level can be greatly affected by what you eat. Eating healthier foods in the appropriate amounts throughout the day at about the same time each day will help you control your blood glucose level. It can also help slow or prevent worsening of your diabetes mellitus. Healthy eating may even help you improve the level of your blood pressure and reach or maintain a healthy weight.  General recommendations for healthful eating and cooking habits include:  Eating meals and snacks regularly. Avoid going long periods of time without eating to lose weight.  Eating a diet that consists mainly of plant-based foods, such as fruits, vegetables, nuts, legumes, and whole grains.  Using low-heat cooking methods, such as baking, instead of high-heat cooking methods, such as deep frying. Work with your dietitian to make sure you understand how to use the Nutrition Facts information on food labels. HOW CAN FOOD AFFECT ME? Carbohydrates Carbohydrates affect your blood glucose level more than any other type of food. Your dietitian will help you determine how many carbohydrates to eat at each meal and teach you how to count carbohydrates. Counting carbohydrates is important to keep your blood glucose at a healthy level, especially if you are using insulin or taking certain medicines for diabetes mellitus. Alcohol Alcohol can cause sudden decreases in blood glucose (hypoglycemia), especially if you use insulin or take certain medicines for diabetes mellitus. Hypoglycemia can be a life-threatening condition. Symptoms of hypoglycemia (sleepiness, dizziness, and disorientation) are similar to symptoms of having too much alcohol.  If your health care provider has given you approval to drink alcohol, do so in moderation and use the following guidelines:  Women should not have more than one drink per day, and men  should not have more than two drinks per day. One drink is equal to:  12 oz of beer.  5 oz of wine.  1 oz of hard liquor.  Do not drink on an empty stomach.  Keep yourself hydrated. Have water, diet soda, or unsweetened iced tea.  Regular soda, juice, and other mixers might contain a lot of carbohydrates and should be counted. WHAT FOODS ARE NOT RECOMMENDED? As you make food choices, it is important to remember that all foods are not the same. Some foods have fewer nutrients per serving than other foods, even though they might have the same number of calories or carbohydrates. It is difficult to get your body what it needs when you eat foods with fewer nutrients. Examples of foods that you should avoid that are high in calories and carbohydrates but low in nutrients include:  Trans fats (most processed foods list trans fats on the Nutrition Facts label).  Regular soda.  Juice.  Candy.  Sweets, such as cake, pie, doughnuts, and cookies.  Fried foods. WHAT FOODS CAN I EAT? Eat nutrient-rich foods, which will nourish your body and keep you healthy. The food you should eat also will depend on several factors, including:  The calories you need.  The medicines you take.  Your weight.  Your blood glucose level.  Your blood pressure level.  Your cholesterol level. You should eat a variety of foods, including:  Protein.  Lean cuts of meat.  Proteins low in saturated fats, such as fish, egg whites, and beans. Avoid processed meats.  Fruits and vegetables.  Fruits and vegetables that may help control blood glucose levels, such as apples, mangoes, and   yams.  Dairy products.  Choose fat-free or low-fat dairy products, such as milk, yogurt, and cheese.  Grains, bread, pasta, and rice.  Choose whole grain products, such as multigrain bread, whole oats, and brown rice. These foods may help control blood pressure.  Fats.  Foods containing healthful fats, such as nuts,  avocado, olive oil, canola oil, and fish. DOES EVERYONE WITH DIABETES MELLITUS HAVE THE SAME MEAL PLAN? Because every person with diabetes mellitus is different, there is not one meal plan that works for everyone. It is very important that you meet with a dietitian who will help you create a meal plan that is just right for you.   This information is not intended to replace advice given to you by your health care provider. Make sure you discuss any questions you have with your health care provider.   Document Released: 07/12/2005 Document Revised: 11/05/2014 Document Reviewed: 09/11/2013 Elsevier Interactive Patient Education 2016 Else[employer] Inc[employer] DASH Eating Plan DASH stands for "Dietary Approaches to Stop Hypertension." The DASH eating plan is a healthy eating plan that has been shown to reduce high blood pressure (hypertension). Additional health benefits may include reducing the risk of type 2 diabetes mellitus, heart disease, and stroke. The DASH eating plan may also help with weight loss. WHAT DO I NEED TO KNOW ABOUT THE DASH EATING PLAN? For the DASH eating plan, you will follow these general guidelines:  Choose foods with a percent daily value for sodium of less than 5% (as listed on the food label).  Use salt-free seasonings or herbs instead of table salt or sea salt.  Check with your health care provider or pharmacist before using salt substitutes.  Eat lower-sodium products, often labeled as "lower sodium" or "no salt added."  Eat fresh foods.  Eat more vegetables, fruits, and low-fat dairy products.  Choose whole grains. Look for the word "whole" as the first word in the ingredient list.  Choose fish and skinless chicken or turkey more often than red meat. Limit fish, poultry, and meat to 6 oz (170 g) each day.  Limit sweets, desserts, sugars, and sugary drinks.  Choose heart-healthy fats.  Limit cheese to 1 oz (28 g) per day.  Eat more home-cooked food and less  restaurant, buffet, and fast food.  Limit fried foods.  Cook foods using methods other than frying.  Limit canned vegetables. If you do use them, rinse them well to decrease the sodium.  When eating at a restaurant, ask that your food be prepared with less salt, or no salt if possible. WHAT FOODS CAN I EAT? Seek help from a dietitian for individual calorie needs. Grains Whole grain or whole wheat bread. Brown rice. Whole grain or whole wheat pasta. Quinoa, bulgur, and whole grain cereals. Low-sodium cereals. Corn or whole wheat flour tortillas. Whole grain cornbread. Whole grain crackers. Low-sodium crackers. Vegetables Fresh or frozen vegetables (raw, steamed, roasted, or grilled). Low-sodium or reduced-sodium tomato and vegetable juices. Low-sodium or reduced-sodium tomato sauce and paste. Low-sodium or reduced-sodium canned vegetables.  Fruits All fresh, canned (in natural juice), or frozen fruits. Meat and Other Protein Products Ground beef (85% or leaner), grass-fed beef, or beef trimmed of fat. Skinless chicken or turkey. Ground chicken or turkey. Pork trimmed of fat. All fish and seafood. Eggs. Dried beans, peas, or lentils. Unsalted nuts and seeds. Unsalted canned beans. Dairy Low-fat dairy products, such as skim or 1% milk, 2% or reduced-fat cheeses, low-fat ricotta or cottage cheese, or plain low-fat yogurt. Low-sodium   or reduced-sodium cheeses. Fats and Oils Tub margarines without trans fats. Light or reduced-fat mayonnaise and salad dressings (reduced sodium). Avocado. Safflower, olive, or canola oils. Natural peanut or almond butter. Other Unsalted popcorn and pretzels. The items listed above may not be a complete list of recommended foods or beverages. Contact your dietitian for more options. WHAT FOODS ARE NOT RECOMMENDED? Grains White bread. White pasta. White rice. Refined cornbread. Bagels and croissants. Crackers that contain trans fat. Vegetables Creamed or fried  vegetables. Vegetables in a cheese sauce. Regular canned vegetables. Regular canned tomato sauce and paste. Regular tomato and vegetable juices. Fruits Dried fruits. Canned fruit in light or heavy syrup. Fruit juice. Meat and Other Protein Products Fatty cuts of meat. Ribs, chicken wings, bacon, sausage, bologna, salami, chitterlings, fatback, hot dogs, bratwurst, and packaged luncheon meats. Salted nuts and seeds. Canned beans with salt. Dairy Whole or 2% milk, cream, half-and-half, and cream cheese. Whole-fat or sweetened yogurt. Full-fat cheeses or blue cheese. Nondairy creamers and whipped toppings. Processed cheese, cheese spreads, or cheese curds. Condiments Onion and garlic salt, seasoned salt, table salt, and sea salt. Canned and packaged gravies. Worcestershire sauce. Tartar sauce. Barbecue sauce. Teriyaki sauce. Soy sauce, including reduced sodium. Steak sauce. Fish sauce. Oyster sauce. Cocktail sauce. Horseradish. Ketchup and mustard. Meat flavorings and tenderizers. Bouillon cubes. Hot sauce. Tabasco sauce. Marinades. Taco seasonings. Relishes. Fats and Oils Butter, stick margarine, lard, shortening, ghee, and bacon fat. Coconut, palm kernel, or palm oils. Regular salad dressings. Other Pickles and olives. Salted popcorn and pretzels. The items listed above may not be a complete list of foods and beverages to avoid. Contact your dietitian for more information. WHERE CAN I FIND MORE INFORMATION? National Heart, Lung, and [employer] Institute: www.nhlbi.nih.gov/health/health-topics/topics/dash/   This information is not intended to replace advice given to you by your health care provider. Make sure you discuss any questions you have with your health care provider.   Document Released: 10/04/2011 Document Revised: 11/05/2014 Document Reviewed: 08/19/2013 Elsevier Interactive Patient Education 2016 Elsevier Inc.  

## 2015-12-27 LAB — MICROALBUMIN / CREATININE URINE RATIO
Creatinine, Urine: 125 mg/dL (ref 20–320)
Microalb Creat Ratio: 1054 mcg/mg creat — ABNORMAL HIGH (ref <30)
Microalb, Ur: 131.7 mg/dL

## 2015-12-27 LAB — HEMOGLOBIN A1C
HEMOGLOBIN A1C: 7.2 % — AB (ref <5.7)
Mean Plasma Glucose: 160 mg/dL — ABNORMAL HIGH (ref <117)

## 2015-12-28 ENCOUNTER — Other Ambulatory Visit: Payer: Self-pay | Admitting: Family Medicine

## 2015-12-28 ENCOUNTER — Other Ambulatory Visit: Payer: Self-pay

## 2015-12-28 DIAGNOSIS — N183 Chronic kidney disease, stage 3 unspecified: Secondary | ICD-10-CM

## 2015-12-28 DIAGNOSIS — E119 Type 2 diabetes mellitus without complications: Secondary | ICD-10-CM

## 2015-12-28 MED ORDER — INSULIN ASPART 100 UNIT/ML FLEXPEN: 4.0000 [IU] | PEN_INJECTOR | Freq: Three times a day (TID) | SUBCUTANEOUS | Status: DC

## 2015-12-28 MED ORDER — INSULIN GLARGINE 100 UNIT/ML SOLOSTAR PEN: 40.0000 [IU] | PEN_INJECTOR | Freq: Every day | SUBCUTANEOUS | Status: DC

## 2015-12-28 MED ORDER — INSULIN PEN NEEDLE 31G X 8 MM MISC
Status: DC
Start: 2015-12-28 — End: 2016-03-14

## 2015-12-28 NOTE — Progress Notes (Signed)
Called and spoke with patient. Explained medication changes to Novalog and Lantus multiple times. Patient verbalized understanding and repeated directions of 4 units of Novalog 3 times daily with meals, and 40 units of lantus every night at bedtime. Advised patient of decreased kidney function and to stop metformin now. She sates she is out of metformin so she will not pick anymore up. Also set appointment for 1 month out 01/27/2016. Made patient aware of nephrology referral and that they will be calling her with an appointment. Patient verbalized understanding. Thanks!

## 2015-12-28 NOTE — Telephone Encounter (Signed)
Refill for novolog sent into pharmacy. Thanks!

## 2015-12-28 NOTE — Progress Notes (Signed)
Ms. Tammy Huff presented on 12/26/2015 to establish care. Reviewed laboratory results. GFR is <30. Will discontinue Metformin immediately. Will increase Lantus to 40 units HS. Will continue Novalog at 4 units with breakfast, lunch, and dinner. Will follow up in office in 1 month. Also sent a referral to nephrology for Stage 3 CKD.    Meds ordered this encounter  Medications  . Insulin Glargine (LANTUS SOLOSTAR) 100 UNIT/ML Solostar Pen    Sig: Inject 40 Units into the skin daily at 10 pm.    Dispense:  15 mL    Refill:  0    Trishia Cuthrell M, FNP

## 2016-01-06 ENCOUNTER — Other Ambulatory Visit (INDEPENDENT_AMBULATORY_CARE_PROVIDER_SITE_OTHER): Payer: Medicare PPO

## 2016-01-06 DIAGNOSIS — E785 Hyperlipidemia, unspecified: Secondary | ICD-10-CM

## 2016-01-06 LAB — LIPID PANEL
CHOL/HDL RATIO: 3.1 ratio (ref <=5.0)
CHOLESTEROL: 198 mg/dL (ref 125–200)
HDL: 64 mg/dL (ref >=46)
LDL Cholesterol: 115 mg/dL (ref <130)
Triglycerides: 93 mg/dL (ref <150)
VLDL: 19 mg/dL (ref <30)

## 2016-01-11 ENCOUNTER — Telehealth: Payer: Self-pay

## 2016-01-11 NOTE — Telephone Encounter (Signed)
Received a fax from [facility]. They have received fax and have rated patient as a level "4"  Meaning they will call back to schedule patient with-in 4 months. Thanks!

## 2016-01-26 ENCOUNTER — Telehealth: Payer: Self-pay

## 2016-01-26 NOTE — Telephone Encounter (Signed)
Patient was rated a 4 at [facility] for her referral and will be called and scheduled sometime in May 2017. Thanks!

## 2016-01-27 ENCOUNTER — Ambulatory Visit: Payer: Medicare Other | Admitting: Family Medicine

## 2016-02-21 ENCOUNTER — Encounter: Payer: Self-pay | Admitting: Internal Medicine

## 2016-03-13 ENCOUNTER — Encounter (HOSPITAL_COMMUNITY): Payer: Self-pay | Admitting: *Deleted

## 2016-03-13 ENCOUNTER — Emergency Department (HOSPITAL_COMMUNITY)
Admission: EM | Admit: 2016-03-13 | Discharge: 2016-03-14 | Disposition: A | Discharge status: Home / Self Care | Payer: Medicare PPO | Attending: Emergency Medicine | Admitting: Emergency Medicine

## 2016-03-13 DIAGNOSIS — N289 Disorder of kidney and ureter, unspecified: Secondary | ICD-10-CM | POA: Insufficient documentation

## 2016-03-13 DIAGNOSIS — Z794 Long term (current) use of insulin: Secondary | ICD-10-CM | POA: Insufficient documentation

## 2016-03-13 DIAGNOSIS — Z79899 Other long term (current) drug therapy: Secondary | ICD-10-CM | POA: Diagnosis not present

## 2016-03-13 DIAGNOSIS — I10 Essential (primary) hypertension: Secondary | ICD-10-CM

## 2016-03-13 DIAGNOSIS — R739 Hyperglycemia, unspecified: Secondary | ICD-10-CM

## 2016-03-13 DIAGNOSIS — Z7982 Long term (current) use of aspirin: Secondary | ICD-10-CM | POA: Insufficient documentation

## 2016-03-13 DIAGNOSIS — M1990 Unspecified osteoarthritis, unspecified site: Secondary | ICD-10-CM | POA: Diagnosis not present

## 2016-03-13 DIAGNOSIS — Z9114 Patient's other noncompliance with medication regimen: Secondary | ICD-10-CM | POA: Diagnosis not present

## 2016-03-13 DIAGNOSIS — E1165 Type 2 diabetes mellitus with hyperglycemia: Secondary | ICD-10-CM | POA: Diagnosis not present

## 2016-03-13 DIAGNOSIS — E119 Type 2 diabetes mellitus without complications: Secondary | ICD-10-CM

## 2016-03-13 LAB — CBG MONITORING, ED: Glucose-Capillary: 516 mg/dL — ABNORMAL HIGH (ref 65–99)

## 2016-03-13 NOTE — ED Notes (Signed)
Pt arrives to the ER with her daughter; daughter advises that pt has been living with her daughter x 2 weeks; daughter reports that pt has been confused over the last few weeks; daughter also states that pt has DM and HTN and has not been taking her medications; daughter states that she does not know what medications her mother takes; daughter reports that the pt is getting up several times at night to use the bathroom; pt denies pain

## 2016-03-14 ENCOUNTER — Emergency Department (HOSPITAL_COMMUNITY): Payer: Medicare PPO

## 2016-03-14 DIAGNOSIS — E1165 Type 2 diabetes mellitus with hyperglycemia: Secondary | ICD-10-CM | POA: Diagnosis not present

## 2016-03-14 LAB — CBC
HEMATOCRIT: 37 % (ref 36.0–46.0)
Hemoglobin: 13 g/dL (ref 12.0–15.0)
MCH: 30 pg (ref 26.0–34.0)
MCHC: 35.1 g/dL (ref 30.0–36.0)
MCV: 85.3 fL (ref 78.0–100.0)
Platelets: 216 10*3/uL (ref 150–400)
RBC: 4.34 MIL/uL (ref 3.87–5.11)
RDW: 14.3 % (ref 11.5–15.5)
WBC: 5.9 10*3/uL (ref 4.0–10.5)

## 2016-03-14 LAB — URINALYSIS, ROUTINE W REFLEX MICROSCOPIC
Bilirubin Urine: NEGATIVE
Glucose, UA: >1000 mg/dL — AB
KETONES UR: NEGATIVE mg/dL
LEUKOCYTES UA: NEGATIVE
NITRITE: NEGATIVE
PH: 6.5 (ref 5.0–8.0)
PROTEIN: 100 mg/dL — AB
Specific Gravity, Urine: 1.026 (ref 1.005–1.030)

## 2016-03-14 LAB — HEPATIC FUNCTION PANEL
ALBUMIN: 3.6 g/dL (ref 3.5–5.0)
ALK PHOS: 115 U/L (ref 38–126)
ALT: 11 U/L — ABNORMAL LOW (ref 14–54)
AST: 17 U/L (ref 15–41)
BILIRUBIN DIRECT: 0.1 mg/dL (ref 0.1–0.5)
BILIRUBIN INDIRECT: 1 mg/dL — AB (ref 0.3–0.9)
BILIRUBIN TOTAL: 1.1 mg/dL (ref 0.3–1.2)
TOTAL PROTEIN: 7.1 g/dL (ref 6.5–8.1)

## 2016-03-14 LAB — DIFFERENTIAL
BASOS PCT: 0 %
Basophils Absolute: 0 10*3/uL (ref 0.0–0.1)
EOS ABS: 0.1 10*3/uL (ref 0.0–0.7)
EOS PCT: 2 %
Lymphocytes Relative: 41 %
Lymphs Abs: 2.4 10*3/uL (ref 0.7–4.0)
Monocytes Absolute: 0.4 10*3/uL (ref 0.1–1.0)
Monocytes Relative: 6 %
NEUTROS PCT: 51 %
Neutro Abs: 3 10*3/uL (ref 1.7–7.7)

## 2016-03-14 LAB — BASIC METABOLIC PANEL
Anion gap: 10 (ref 5–15)
BUN: 13 mg/dL (ref 6–20)
CO2: 28 mmol/L (ref 22–32)
Calcium: 9.2 mg/dL (ref 8.9–10.3)
Chloride: 94 mmol/L — ABNORMAL LOW (ref 101–111)
Creatinine, Ser: 1.71 mg/dL — ABNORMAL HIGH (ref 0.44–1.00)
GFR, EST AFRICAN AMERICAN: 32 mL/min — AB (ref >60)
GFR, EST NON AFRICAN AMERICAN: 28 mL/min — AB (ref >60)
Glucose, Bld: 560 mg/dL (ref 65–99)
POTASSIUM: 3.7 mmol/L (ref 3.5–5.1)
SODIUM: 132 mmol/L — AB (ref 135–145)

## 2016-03-14 LAB — URINE MICROSCOPIC-ADD ON

## 2016-03-14 LAB — CBG MONITORING, ED
GLUCOSE-CAPILLARY: 212 mg/dL — AB (ref 65–99)
Glucose-Capillary: 428 mg/dL — ABNORMAL HIGH (ref 65–99)

## 2016-03-14 MED ORDER — PRAVASTATIN SODIUM 80 MG PO TABS: 80.0000 mg | ORAL_TABLET | ORAL | Status: AC

## 2016-03-14 MED ORDER — CARVEDILOL 6.25 MG PO TABS: 6.2500 mg | ORAL_TABLET | Freq: Two times a day (BID) | ORAL | Status: AC

## 2016-03-14 MED ORDER — LOSARTAN POTASSIUM-HCTZ 100-25 MG PO TABS: 1.0000 | ORAL_TABLET | Freq: Every day | ORAL | Status: AC

## 2016-03-14 MED ORDER — SODIUM CHLORIDE 0.9 % IV SOLN
1000.0000 mL | INTRAVENOUS | Status: DC
  Administered 2016-03-14: 1000 mL via INTRAVENOUS

## 2016-03-14 MED ORDER — INSULIN PEN NEEDLE 31G X 8 MM MISC: Status: AC

## 2016-03-14 MED ORDER — INSULIN GLARGINE 100 UNIT/ML SOLOSTAR PEN: 40.0000 [IU] | PEN_INJECTOR | Freq: Every day | SUBCUTANEOUS | Status: AC

## 2016-03-14 MED ORDER — SODIUM CHLORIDE 0.9 % IV SOLN
1000.0000 mL | Freq: Once | INTRAVENOUS | Status: AC
  Administered 2016-03-14: 1000 mL via INTRAVENOUS

## 2016-03-14 MED ORDER — INSULIN ASPART 100 UNIT/ML FLEXPEN: 4.0000 [IU] | PEN_INJECTOR | Freq: Three times a day (TID) | SUBCUTANEOUS | Status: AC

## 2016-03-14 MED ORDER — CLONIDINE HCL 0.2 MG PO TABS: 0.2000 mg | ORAL_TABLET | Freq: Two times a day (BID) | ORAL | Status: AC

## 2016-03-14 MED ORDER — INSULIN ASPART 100 UNIT/ML IV SOLN
10.0000 [IU] | Freq: Once | INTRAVENOUS | Status: AC
  Administered 2016-03-14: 10 [IU] via INTRAVENOUS
  Filled 2016-03-14: qty 0.1

## 2016-03-14 NOTE — ED Notes (Signed)
Pt's daughter contacted for pt discharge; daughter did not pick up

## 2016-03-14 NOTE — Discharge Instructions (Signed)
I have reordered all of your medications. Your insulin is expensive, but there are online coupons that can bring the cost down rather dramatically. Please check for these coupons. Talk with your primary care provider to see if your medication for diabetes can be changed to something less expensive.  Type 2 Diabetes Mellitus, Adult Type 2 diabetes mellitus, often simply referred to as type 2 diabetes, is a long-lasting (chronic) disease. In type 2 diabetes, the pancreas does not make enough insulin (a hormone), the cells are less responsive to the insulin that is made (insulin resistance), or both. Normally, insulin moves sugars from food into the tissue cells. The tissue cells use the sugars for energy. The lack of insulin or the lack of normal response to insulin causes excess sugars to build up in the blood instead of going into the tissue cells. As a result, high blood sugar (hyperglycemia) develops. The effect of high sugar (glucose) levels can cause many complications. Type 2 diabetes was also previously called adult-onset diabetes, but it can occur at any age.  RISK FACTORS  A person is predisposed to developing type 2 diabetes if someone in the family has the disease and also has one or more of the following primary risk factors:  Weight gain, or being overweight or obese.  An inactive lifestyle.  A history of consistently eating high-calorie foods. Maintaining a normal weight and regular physical activity can reduce the chance of developing type 2 diabetes. SYMPTOMS  A person with type 2 diabetes may not show symptoms initially. The symptoms of type 2 diabetes appear slowly. The symptoms include:  Increased thirst (polydipsia).  Increased urination (polyuria).  Increased urination during the night (nocturia).  Sudden or unexplained weight changes.  Frequent, recurring infections.  Tiredness (fatigue).  Weakness.  Vision changes, such as blurred vision.  Fruity smell to  your breath.  Abdominal pain.  Nausea or vomiting.  Cuts or bruises which are slow to heal.  Tingling or numbness in the hands or feet.  An open skin wound (ulcer). DIAGNOSIS Type 2 diabetes is frequently not diagnosed until complications of diabetes are present. Type 2 diabetes is diagnosed when symptoms or complications are present and when blood glucose levels are increased. Your blood glucose level may be checked by one or more of the following blood tests:  A fasting blood glucose test. You will not be allowed to eat for at least 8 hours before a blood sample is taken.  A random blood glucose test. Your blood glucose is checked at any time of the day regardless of when you ate.  A hemoglobin A1c blood glucose test. A hemoglobin A1c test provides information about blood glucose control over the previous 3 months.  An oral glucose tolerance test (OGTT). Your blood glucose is measured after you have not eaten (fasted) for 2 hours and then after you drink a glucose-containing beverage. TREATMENT   You may need to take insulin or diabetes medicine daily to keep blood glucose levels in the desired range.  If you use insulin, you may need to adjust the dosage depending on the carbohydrates that you eat with each meal or snack.  Lifestyle changes are recommended as part of your treatment. These may include:  Following an individualized diet plan developed by a nutritionist or dietitian.  Exercising daily. Your health care providers will set individualized treatment goals for you based on your age, your medicines, how long you have had diabetes, and any other medical conditions you have. Generally,  the goal of treatment is to maintain the following blood glucose levels:  Before meals (preprandial): 80-130 mg/dL.  After meals (postprandial): below 180 mg/dL.  A1c: less than 6.5-7%. HOME CARE INSTRUCTIONS   Have your hemoglobin A1c level checked twice a year.  Perform daily blood  glucose monitoring as directed by your health care provider.  Monitor urine ketones when you are ill and as directed by your health care provider.  Take your diabetes medicine or insulin as directed by your health care provider to maintain your blood glucose levels in the desired range.  Never run out of diabetes medicine or insulin. It is needed every day.  If you are using insulin, you may need to adjust the amount of insulin given based on your intake of carbohydrates. Carbohydrates can raise blood glucose levels but need to be included in your diet. Carbohydrates provide vitamins, minerals, and fiber which are an essential part of a healthy diet. Carbohydrates are found in fruits, vegetables, whole grains, dairy products, legumes, and foods containing added sugars.  Eat healthy foods. You should make an appointment to see a registered dietitian to help you create an eating plan that is right for you.  Lose weight if you are overweight.  Carry a medical alert card or wear your medical alert jewelry.  Carry a 15-gram carbohydrate snack with you at all times to treat low blood glucose (hypoglycemia). Some examples of 15-gram carbohydrate snacks include:  Glucose tablets, 3 or 4.  Glucose gel, 15-gram tube.  Raisins, 2 tablespoons (24 grams).  Jelly beans, 6.  Animal crackers, 8.  Regular pop, 4 ounces (120 mL).  Gummy treats, 9.  Recognize hypoglycemia. Hypoglycemia occurs with blood glucose levels of 70 mg/dL and below. The risk for hypoglycemia increases when fasting or skipping meals, during or after intense exercise, and during sleep. Hypoglycemia symptoms can include:  Tremors or shakes.  Decreased ability to concentrate.  Sweating.  Increased heart rate.  Headache.  Dry mouth.  Hunger.  Irritability.  Anxiety.  Restless sleep.  Altered speech or coordination.  Confusion.  Treat hypoglycemia promptly. If you are alert and able to safely swallow, follow  the 15:15 rule:  Take 15-20 grams of rapid-acting glucose or carbohydrate. Rapid-acting options include glucose gel, glucose tablets, or 4 ounces (120 mL) of fruit juice, regular soda, or low-fat milk.  Check your blood glucose level 15 minutes after taking the glucose.  Take 15-20 grams more of glucose if the repeat blood glucose level is still 70 mg/dL or below.  Eat a meal or snack within 1 hour once blood glucose levels return to normal.  Be alert to feeling very thirsty and urinating more frequently than usual, which are early signs of hyperglycemia. An early awareness of hyperglycemia allows for prompt treatment. Treat hyperglycemia as directed by your health care provider.  Engage in at least 150 minutes of moderate-intensity physical activity a week, spread over at least 3 days of the week or as directed by your health care provider. In addition, you should engage in resistance exercise at least 2 times a week or as directed by your health care provider. Try to spend no more than 90 minutes at one time inactive.  Adjust your medicine and food intake as needed if you start a new exercise or sport.  Follow your sick-day plan anytime you are unable to eat or drink as usual.  Do not use any tobacco products including cigarettes, chewing tobacco, or electronic cigarettes. If you need  help quitting, ask your health care provider.  Limit alcohol intake to no more than 1 drink per day for nonpregnant women and 2 drinks per day for men. You should drink alcohol only when you are also eating food. Talk with your health care provider whether alcohol is safe for you. Tell your health care provider if you drink alcohol several times a week.  Keep all follow-up visits as directed by your health care provider. This is important.  Schedule an eye exam soon after the diagnosis of type 2 diabetes and then annually.  Perform daily skin and foot care. Examine your skin and feet daily for cuts, bruises,  redness, nail problems, bleeding, blisters, or sores. A foot exam by a health care provider should be done annually.  Brush your teeth and gums at least twice a day and floss at least once a day. Follow up with your dentist regularly.  Share your diabetes management plan with your workplace or school.  Keep your immunizations up to date. It is recommended that you receive a flu (influenza) vaccine every year. It is also recommended that you receive a pneumonia (pneumococcal) vaccine. If you are 54 years of age or older and have never received a pneumonia vaccine, this vaccine may be given as a series of two separate shots. Ask your health care provider which additional vaccines may be recommended.  Learn to manage stress.  Obtain ongoing diabetes education and support as needed.  Participate in or seek rehabilitation as needed to maintain or improve independence and quality of life. Request a physical or occupational therapy referral if you are having foot or hand numbness, or difficulties with grooming, dressing, eating, or physical activity. SEEK MEDICAL CARE IF:   You are unable to eat food or drink fluids for more than 6 hours.  You have nausea and vomiting for more than 6 hours.  Your blood glucose level is over 240 mg/dL.  There is a change in mental status.  You develop an additional serious illness.  You have diarrhea for more than 6 hours.  You have been sick or have had a fever for a couple of days and are not getting better.  You have pain during any physical activity.  SEEK IMMEDIATE MEDICAL CARE IF:  You have difficulty breathing.  You have moderate to large ketone levels.   This information is not intended to replace advice given to you by your health care provider. Make sure you discuss any questions you have with your health care provider.   Document Released: 10/15/2005 Document Revised: 07/06/2015 Document Reviewed: 05/13/2012 Elsevier Interactive Patient  Education [employer]

## 2016-03-14 NOTE — ED Notes (Signed)
[name] (Daughter) [phone]

## 2016-03-14 NOTE — ED Provider Notes (Signed)
CSN: [id number]     Arrival date & time 03/13/16  2335 History  By signing my name below, I, [facility], attest that this documentation has been prepared under the direction and in the presence of Dione Booze, MD. Electronically Signed: Randell Patient, ED Scribe. 03/14/2016. 12:22 AM.   Chief Complaint  Patient presents with  . Hyperglycemia   The history is provided by the patient and a relative. No language interpreter was used.  HPI Comments: Tammy Huff is a 75 y.o. female with an hx of DM, HTN, arthritis, and who presents to the Emergency Department complaining of mild, gradually worsening, confusion. History provided mostly by pt's daughter. Pt reports general malaise for the past week. Daughter reports that the pt has been living with her for the past week and has not taken any medications this entire week. She notes prior to moving in with her the pt has had moderate, gradually worsening confusion that causes her to have conversations with people who are not present. Confusion is worse at night. She states that the pt told her that she ran out of her medications because she could not afford them. Denies recent falls. Denies any other symptoms currently.  Past Medical History  Diagnosis Date  . Diabetes mellitus without complication (HCC)   . Hypertension   . Arthritis   . Diverticulitis    Past Surgical History  Procedure Laterality Date  . Colon resection     No family history on file. Social History  Substance Use Topics  . Smoking status: Never Smoker   . Smokeless tobacco: None  . Alcohol Use: No   OB History    No data available     Review of Systems  Psychiatric/Behavioral: Positive for confusion.  All other systems reviewed and are negative.  Allergies  Review of patient's allergies indicates no known allergies.  Home Medications   Prior to Admission medications   Medication Sig Start Date End Date Taking? Authorizing Provider  famotidine  (PEPCID AC) 10 MG chewable tablet Chew 10 mg by mouth every morning.    Yes Historical Provider, MD  amLODipine (NORVASC) 10 MG tablet Take 10 mg by mouth every evening.    Historical Provider, MD  aspirin EC 325 MG EC tablet Take 1 tablet (325 mg total) by mouth daily. 08/12/14   Richarda Overlie, MD  benzonatate (TESSALON) 100 MG capsule Take 1 capsule (100 mg total) by mouth 3 (three) times daily as needed for cough. Patient not taking: Reported on 12/26/2015 11/23/15   Alvira Monday, MD  carvedilol (COREG) 6.25 MG tablet Take 6.25 mg by mouth 2 (two) times daily with a meal. Reported on 12/26/2015    Historical Provider, MD  cloNIDine (CATAPRES) 0.2 MG tablet Take 1 tablet (0.2 mg total) by mouth 2 (two) times daily. 11/23/15   Alvira Monday, MD  diphenhydrAMINE (BENADRYL) 25 MG tablet Take 25 mg by mouth daily as needed for allergies.     Historical Provider, MD  insulin aspart (NOVOLOG FLEXPEN) 100 UNIT/ML FlexPen Inject 4 Units into the skin 3 (three) times daily with meals. 12/28/15   Massie Maroon, FNP  Insulin Glargine (LANTUS SOLOSTAR) 100 UNIT/ML Solostar Pen Inject 40 Units into the skin daily at 10 pm. 12/28/15   Massie Maroon, FNP  Insulin Pen Needle (RELION PEN NEEDLE 31G/8MM) 31G X 8 MM MISC Use as directed. 12/28/15   Massie Maroon, FNP  ipratropium (ATROVENT) 0.03 % nasal spray Place 2 sprays into both  nostrils every 12 (twelve) hours. Patient not taking: Reported on 12/26/2015 11/23/15   Alvira Monday, MD  Iron-Vitamins (GERITOL PO) Take 1 tablet by mouth every morning.    Historical Provider, MD  losartan-hydrochlorothiazide (HYZAAR) 100-25 MG tablet Take 1 tablet by mouth daily.    Historical Provider, MD  meclizine (ANTIVERT) 25 MG tablet Take 1 tablet (25 mg total) by mouth 3 (three) times daily as needed. Patient not taking: Reported on 12/26/2015 08/12/14   Richarda Overlie, MD  pravastatin (PRAVACHOL) 80 MG tablet Take 1 tablet (80 mg total) by mouth every morning. 11/23/15    Alvira Monday, MD   BP 179/103 mmHg  Pulse 99  Temp(Src) 97.8 F (36.6 C) (Oral)  Resp 18  Ht  (1.575 m)  Wt 216 lb (97.977 kg)  BMI 39.50 kg/m2  SpO2 97% Physical Exam  Constitutional: She appears well-developed and well-nourished. No distress.  HENT:  Head: Normocephalic and atraumatic.  Eyes: Conjunctivae and EOM are normal. Pupils are equal, round, and reactive to light.  Neck: Normal range of motion. Neck supple. No JVD present.  Cardiovascular: Normal rate, regular rhythm and normal heart sounds.   No murmur heard. Pulmonary/Chest: Effort normal. She has no wheezes. She has no rales. She exhibits no tenderness.  Abdominal: Soft. Bowel sounds are normal. She exhibits no distension and no mass. There is no tenderness.  Musculoskeletal: Normal range of motion. She exhibits no edema.  Lymphadenopathy:    She has no cervical adenopathy.  Neurological: She is alert. No cranial nerve deficit. She exhibits normal muscle tone. Coordination normal.  Confusion during conversation. Oriented to person and place but not time.  Skin: Skin is warm and dry. No rash noted.  Psychiatric: She has a normal mood and affect.  Nursing note and vitals reviewed.   ED Course  Procedures   DIAGNOSTIC STUDIES: Oxygen Saturation is 97% on RA, normal by my interpretation.    COORDINATION OF CARE: 12:16 AM Will order chest x-ray and labs. Discussed treatment plan with pt at bedside and pt agreed to plan.   Labs Review Results for orders placed or performed during the hospital encounter of 03/13/16  Basic metabolic panel  Result Value Ref Range   Sodium 132 (L) 135 - 145 mmol/L   Potassium 3.7 3.5 - 5.1 mmol/L   Chloride 94 (L) 101 - 111 mmol/L   CO2 28 22 - 32 mmol/L   Glucose, Bld 560 (HH) 65 - 99 mg/dL   BUN 13 6 - 20 mg/dL   Creatinine, Ser 5.78 (H) 0.44 - 1.00 mg/dL   Calcium 9.2 8.9 - 46.9 mg/dL   GFR calc non Af Amer 28 (L) >60 mL/min   GFR calc Af Amer 32 (L) >60 mL/min    Anion gap 10 5 - 15  CBC  Result Value Ref Range   WBC 5.9 4.0 - 10.5 K/uL   RBC 4.34 3.87 - 5.11 MIL/uL   Hemoglobin 13.0 12.0 - 15.0 g/dL   HCT 62.9 52.8 - 41.3 %   MCV 85.3 78.0 - 100.0 fL   MCH 30.0 26.0 - 34.0 pg   MCHC 35.1 30.0 - 36.0 g/dL   RDW 24.4 01.0 - 27.2 %   Platelets 216 150 - 400 K/uL  Urinalysis, Routine w reflex microscopic  Result Value Ref Range   Color, Urine YELLOW YELLOW   APPearance CLEAR CLEAR   Specific Gravity, Urine 1.026 1.005 - 1.030   pH 6.5 5.0 - 8.0   Glucose,  UA >1000 (A) NEGATIVE mg/dL   Hgb urine dipstick TRACE (A) NEGATIVE   Bilirubin Urine NEGATIVE NEGATIVE   Ketones, ur NEGATIVE NEGATIVE mg/dL   Protein, ur 161 (A) NEGATIVE mg/dL   Nitrite NEGATIVE NEGATIVE   Leukocytes, UA NEGATIVE NEGATIVE  Hepatic function panel  Result Value Ref Range   Total Protein 7.1 6.5 - 8.1 g/dL   Albumin 3.6 3.5 - 5.0 g/dL   AST 17 15 - 41 U/L   ALT 11 (L) 14 - 54 U/L   Alkaline Phosphatase 115 38 - 126 U/L   Total Bilirubin 1.1 0.3 - 1.2 mg/dL   Bilirubin, Direct 0.1 0.1 - 0.5 mg/dL   Indirect Bilirubin 1.0 (H) 0.3 - 0.9 mg/dL  Differential  Result Value Ref Range   Neutrophils Relative % 51 %   Neutro Abs 3.0 1.7 - 7.7 K/uL   Lymphocytes Relative 41 %   Lymphs Abs 2.4 0.7 - 4.0 K/uL   Monocytes Relative 6 %   Monocytes Absolute 0.4 0.1 - 1.0 K/uL   Eosinophils Relative 2 %   Eosinophils Absolute 0.1 0.0 - 0.7 K/uL   Basophils Relative 0 %   Basophils Absolute 0.0 0.0 - 0.1 K/uL  Urine microscopic-add on  Result Value Ref Range   Squamous Epithelial / LPF 0-5 (A) NONE SEEN   WBC, UA 0-5 0 - 5 WBC/hpf   RBC / HPF 0-5 0 - 5 RBC/hpf   Bacteria, UA FEW (A) NONE SEEN   Urine-Other YEAST PRESENT   CBG monitoring, ED  Result Value Ref Range   Glucose-Capillary 516 (H) 65 - 99 mg/dL   Comment 1 Notify RN    Comment 2 Document in Chart   CBG monitoring, ED  Result Value Ref Range   Glucose-Capillary 428 (H) 65 - 99 mg/dL  CBG monitoring, ED   Result Value Ref Range   Glucose-Capillary 212 (H) 65 - 99 mg/dL   Imaging Review Dg Chest 2 View  03/14/2016  CLINICAL DATA:  75 year old female with dizziness and weakness EXAM: CHEST  2 VIEW COMPARISON:  Chest radiograph dated 11/23/2015 FINDINGS: The heart size and mediastinal contours are within normal limits. Both lungs are clear. The visualized skeletal structures are unremarkable. IMPRESSION: No active cardiopulmonary disease. Electronically Signed   By: Elgie Collard M.D.   On: 03/14/2016 03:47   I have personally reviewed and evaluated these images and lab results as part of my medical decision-making.   MDM   Final diagnoses:  Hyperglycemia  Renal insufficiency  Noncompliance with medication regimen    Hyperglycemia secondary to medication noncompliance. Old records are reviewed and she actually had been hospitalized in January of this year for hyperosmolar nonketotic state. She has not been able to afford her medications, but apparently has not made any attempts to find ways to reduce the cost so that she could get her medication. She started on IV hydration and IV insulin and screening labs are obtained. She does show evidence of hyperglycemia, but with normal CO2 and normal anion gap. With hydration and his son, blood sugars come down. I have talked with the patient and her family and have pointed out various online medication discount options. She is given new prescriptions for all of her medications including insulin. They were offered the option of staying for case management to talk with them, but decided to leave.  I personally performed the services described in this documentation, which was scribed in my presence. The recorded information has been reviewed  and is accurate.      Dione Boozeavid Katilyn Miltenberger, MD 03/15/16 [phone]

## 2016-03-14 NOTE — ED Notes (Signed)
CBG 428. 

## 2016-03-28 ENCOUNTER — Ambulatory Visit: Payer: Medicare Other | Admitting: Family Medicine

## 2016-04-25 ENCOUNTER — Emergency Department (HOSPITAL_COMMUNITY): Payer: Medicare PPO

## 2016-04-25 ENCOUNTER — Observation Stay (HOSPITAL_BASED_OUTPATIENT_CLINIC_OR_DEPARTMENT_OTHER): Payer: Medicare PPO

## 2016-04-25 ENCOUNTER — Observation Stay (HOSPITAL_COMMUNITY)
Admission: EM | Admit: 2016-04-25 | Discharge: 2016-04-27 | Disposition: A | Discharge status: Home / Self Care | Payer: Medicare PPO | Attending: Internal Medicine | Admitting: Internal Medicine

## 2016-04-25 ENCOUNTER — Observation Stay (HOSPITAL_COMMUNITY): Payer: Medicare PPO

## 2016-04-25 ENCOUNTER — Encounter (HOSPITAL_COMMUNITY): Payer: Self-pay | Admitting: Emergency Medicine

## 2016-04-25 DIAGNOSIS — E049 Nontoxic goiter, unspecified: Secondary | ICD-10-CM | POA: Insufficient documentation

## 2016-04-25 DIAGNOSIS — Z8719 Personal history of other diseases of the digestive system: Secondary | ICD-10-CM | POA: Insufficient documentation

## 2016-04-25 DIAGNOSIS — N289 Disorder of kidney and ureter, unspecified: Secondary | ICD-10-CM

## 2016-04-25 DIAGNOSIS — M1990 Unspecified osteoarthritis, unspecified site: Secondary | ICD-10-CM | POA: Diagnosis not present

## 2016-04-25 DIAGNOSIS — R4182 Altered mental status, unspecified: Secondary | ICD-10-CM

## 2016-04-25 DIAGNOSIS — E1100 Type 2 diabetes mellitus with hyperosmolarity without nonketotic hyperglycemic-hyperosmolar coma (NKHHC): Secondary | ICD-10-CM | POA: Diagnosis not present

## 2016-04-25 DIAGNOSIS — R4781 Slurred speech: Secondary | ICD-10-CM

## 2016-04-25 DIAGNOSIS — Z7982 Long term (current) use of aspirin: Secondary | ICD-10-CM | POA: Insufficient documentation

## 2016-04-25 DIAGNOSIS — Z794 Long term (current) use of insulin: Secondary | ICD-10-CM | POA: Diagnosis not present

## 2016-04-25 DIAGNOSIS — G459 Transient cerebral ischemic attack, unspecified: Secondary | ICD-10-CM | POA: Diagnosis not present

## 2016-04-25 DIAGNOSIS — F0391 Unspecified dementia with behavioral disturbance: Secondary | ICD-10-CM | POA: Insufficient documentation

## 2016-04-25 DIAGNOSIS — E118 Type 2 diabetes mellitus with unspecified complications: Secondary | ICD-10-CM | POA: Diagnosis not present

## 2016-04-25 DIAGNOSIS — E1165 Type 2 diabetes mellitus with hyperglycemia: Secondary | ICD-10-CM

## 2016-04-25 DIAGNOSIS — E871 Hypo-osmolality and hyponatremia: Secondary | ICD-10-CM

## 2016-04-25 DIAGNOSIS — K219 Gastro-esophageal reflux disease without esophagitis: Secondary | ICD-10-CM | POA: Diagnosis not present

## 2016-04-25 DIAGNOSIS — N182 Chronic kidney disease, stage 2 (mild): Secondary | ICD-10-CM | POA: Diagnosis not present

## 2016-04-25 DIAGNOSIS — I129 Hypertensive chronic kidney disease with stage 1 through stage 4 chronic kidney disease, or unspecified chronic kidney disease: Secondary | ICD-10-CM | POA: Diagnosis not present

## 2016-04-25 DIAGNOSIS — E876 Hypokalemia: Secondary | ICD-10-CM

## 2016-04-25 DIAGNOSIS — F03918 Unspecified dementia, unspecified severity, with other behavioral disturbance: Secondary | ICD-10-CM

## 2016-04-25 DIAGNOSIS — E785 Hyperlipidemia, unspecified: Secondary | ICD-10-CM | POA: Diagnosis not present

## 2016-04-25 DIAGNOSIS — E11649 Type 2 diabetes mellitus with hypoglycemia without coma: Secondary | ICD-10-CM | POA: Diagnosis not present

## 2016-04-25 DIAGNOSIS — I998 Other disorder of circulatory system: Secondary | ICD-10-CM | POA: Diagnosis present

## 2016-04-25 DIAGNOSIS — N189 Chronic kidney disease, unspecified: Secondary | ICD-10-CM | POA: Diagnosis not present

## 2016-04-25 DIAGNOSIS — I10 Essential (primary) hypertension: Secondary | ICD-10-CM | POA: Diagnosis present

## 2016-04-25 DIAGNOSIS — Z515 Encounter for palliative care: Secondary | ICD-10-CM | POA: Diagnosis not present

## 2016-04-25 DIAGNOSIS — R2990 Unspecified symptoms and signs involving the nervous system: Secondary | ICD-10-CM

## 2016-04-25 DIAGNOSIS — E1122 Type 2 diabetes mellitus with diabetic chronic kidney disease: Secondary | ICD-10-CM | POA: Insufficient documentation

## 2016-04-25 DIAGNOSIS — I639 Cerebral infarction, unspecified: Secondary | ICD-10-CM

## 2016-04-25 DIAGNOSIS — Z7189 Other specified counseling: Secondary | ICD-10-CM

## 2016-04-25 HISTORY — DX: Unspecified dementia, unspecified severity, without behavioral disturbance, psychotic disturbance, mood disturbance, and anxiety: F03.90

## 2016-04-25 LAB — VAS US CAROTID
LCCAPDIAS: -10 cm/s
LCCAPSYS: -71 cm/s
LEFT ECA DIAS: -7 cm/s
LEFT VERTEBRAL DIAS: -6 cm/s
LICADDIAS: -28 cm/s
LICAPSYS: -51 cm/s
Left CCA dist dias: -11 cm/s
Left CCA dist sys: -63 cm/s
Left ICA dist sys: -114 cm/s
Left ICA prox dias: -11 cm/s
RIGHT ECA DIAS: -2 cm/s
RIGHT VERTEBRAL DIAS: -7 cm/s
Right CCA prox dias: -7 cm/s
Right CCA prox sys: -53 cm/s
Right cca dist sys: -49 cm/s

## 2016-04-25 LAB — DIFFERENTIAL
BASOS ABS: 0.1 10*3/uL (ref 0.0–0.1)
BASOS PCT: 1 %
EOS PCT: 1 %
Eosinophils Absolute: 0.1 10*3/uL (ref 0.0–0.7)
LYMPHS ABS: 2.1 10*3/uL (ref 0.7–4.0)
Lymphocytes Relative: 39 %
MONOS PCT: 8 %
Monocytes Absolute: 0.4 10*3/uL (ref 0.1–1.0)
Neutro Abs: 2.8 10*3/uL (ref 1.7–7.7)
Neutrophils Relative %: 51 %
Smear Review: DECREASED

## 2016-04-25 LAB — CBC
HEMATOCRIT: 38.2 % (ref 36.0–46.0)
HEMOGLOBIN: 13.5 g/dL (ref 12.0–15.0)
MCH: 30.7 pg (ref 26.0–34.0)
MCHC: 35.3 g/dL (ref 30.0–36.0)
MCV: 86.8 fL (ref 78.0–100.0)
Platelets: DECREASED 10*3/uL (ref 150–400)
RBC: 4.4 MIL/uL (ref 3.87–5.11)
RDW: 13.8 % (ref 11.5–15.5)
WBC: 5.5 10*3/uL (ref 4.0–10.5)

## 2016-04-25 LAB — I-STAT CHEM 8, ED
BUN: 12 mg/dL (ref 6–20)
CALCIUM ION: 1.01 mmol/L — AB (ref 1.12–1.23)
CREATININE: 1.4 mg/dL — AB (ref 0.44–1.00)
Chloride: 95 mmol/L — ABNORMAL LOW (ref 101–111)
GLUCOSE: 411 mg/dL — AB (ref 65–99)
HEMATOCRIT: 43 % (ref 36.0–46.0)
Hemoglobin: 14.6 g/dL (ref 12.0–15.0)
POTASSIUM: 3.5 mmol/L (ref 3.5–5.1)
SODIUM: 135 mmol/L (ref 135–145)
TCO2: 27 mmol/L (ref 0–100)

## 2016-04-25 LAB — COMPREHENSIVE METABOLIC PANEL
ALBUMIN: 3.2 g/dL — AB (ref 3.5–5.0)
ALK PHOS: 77 U/L (ref 38–126)
ALT: 11 U/L — ABNORMAL LOW (ref 14–54)
ANION GAP: 17 — AB (ref 5–15)
AST: 17 U/L (ref 15–41)
BILIRUBIN TOTAL: 1.3 mg/dL — AB (ref 0.3–1.2)
BUN: 9 mg/dL (ref 6–20)
CALCIUM: 8.7 mg/dL — AB (ref 8.9–10.3)
CO2: 21 mmol/L — ABNORMAL LOW (ref 22–32)
Chloride: 93 mmol/L — ABNORMAL LOW (ref 101–111)
Creatinine, Ser: 1.66 mg/dL — ABNORMAL HIGH (ref 0.44–1.00)
GFR calc Af Amer: 33 mL/min — ABNORMAL LOW (ref >60)
GFR calc non Af Amer: 29 mL/min — ABNORMAL LOW (ref >60)
GLUCOSE: 406 mg/dL — AB (ref 65–99)
Potassium: 3.3 mmol/L — ABNORMAL LOW (ref 3.5–5.1)
Sodium: 131 mmol/L — ABNORMAL LOW (ref 135–145)
TOTAL PROTEIN: 7 g/dL (ref 6.5–8.1)

## 2016-04-25 LAB — CBG MONITORING, ED: Glucose-Capillary: 398 mg/dL — ABNORMAL HIGH (ref 65–99)

## 2016-04-25 LAB — GLUCOSE, CAPILLARY
GLUCOSE-CAPILLARY: 367 mg/dL — AB (ref 65–99)
GLUCOSE-CAPILLARY: 248 mg/dL — AB (ref 65–99)
Glucose-Capillary: 261 mg/dL — ABNORMAL HIGH (ref 65–99)
Glucose-Capillary: 60 mg/dL — ABNORMAL LOW (ref 65–99)
Glucose-Capillary: 379 mg/dL — ABNORMAL HIGH (ref 65–99)

## 2016-04-25 LAB — URINALYSIS, ROUTINE W REFLEX MICROSCOPIC
Bilirubin Urine: NEGATIVE
Ketones, ur: 15 mg/dL — AB
LEUKOCYTES UA: NEGATIVE
NITRITE: NEGATIVE
PROTEIN: 100 mg/dL — AB
Specific Gravity, Urine: 1.021 (ref 1.005–1.030)
pH: 6.5 (ref 5.0–8.0)

## 2016-04-25 LAB — ETHANOL: Alcohol, Ethyl (B): <5 mg/dL (ref <5)

## 2016-04-25 LAB — URINE MICROSCOPIC-ADD ON

## 2016-04-25 LAB — I-STAT TROPONIN, ED: Troponin i, poc: 0.01 ng/mL (ref 0.00–0.08)

## 2016-04-25 LAB — CREATININE, SERUM
Creatinine, Ser: 1.49 mg/dL — ABNORMAL HIGH (ref 0.44–1.00)
GFR calc non Af Amer: 33 mL/min — ABNORMAL LOW (ref >60)
GFR, EST AFRICAN AMERICAN: 38 mL/min — AB (ref >60)

## 2016-04-25 LAB — PROTIME-INR
INR: 1 (ref 0.00–1.49)
Prothrombin Time: 13.4 seconds (ref 11.6–15.2)

## 2016-04-25 MED ORDER — DEXTROSE 50 % IV SOLN
50.0000 mL | Freq: Once | INTRAVENOUS | Status: AC
  Administered 2016-04-25: 50 mL via INTRAVENOUS

## 2016-04-25 MED ORDER — SODIUM CHLORIDE 0.45 % IV SOLN
INTRAVENOUS | Status: DC
  Administered 2016-04-25: 11:00:00 via INTRAVENOUS

## 2016-04-25 MED ORDER — IOPAMIDOL (ISOVUE-370) INJECTION 76%
INTRAVENOUS | Status: AC
  Administered 2016-04-25: 50 mL
  Filled 2016-04-25: qty 50

## 2016-04-25 MED ORDER — INSULIN ASPART 100 UNIT/ML ~~LOC~~ SOLN
0.0000 [IU] | Freq: Three times a day (TID) | SUBCUTANEOUS | Status: DC
  Administered 2016-04-25: 5 [IU] via SUBCUTANEOUS
  Administered 2016-04-25: 9 [IU] via SUBCUTANEOUS
  Administered 2016-04-26: 2 [IU] via SUBCUTANEOUS
  Administered 2016-04-26: 3 [IU] via SUBCUTANEOUS
  Administered 2016-04-27: 1 [IU] via SUBCUTANEOUS

## 2016-04-25 MED ORDER — ACETAMINOPHEN 325 MG PO TABS
650.0000 mg | ORAL_TABLET | Freq: Four times a day (QID) | ORAL | Status: DC | PRN
Start: 2016-04-25 — End: 2016-04-27
  Administered 2016-04-26: 650 mg via ORAL
  Filled 2016-04-25: qty 2

## 2016-04-25 MED ORDER — HALOPERIDOL LACTATE 5 MG/ML IJ SOLN
5.0000 mg | Freq: Four times a day (QID) | INTRAMUSCULAR | Status: DC | PRN
  Administered 2016-04-26: 5 mg via INTRAMUSCULAR
  Filled 2016-04-25: qty 1

## 2016-04-25 MED ORDER — LORAZEPAM 2 MG/ML IJ SOLN
1.0000 mg | Freq: Four times a day (QID) | INTRAMUSCULAR | Status: DC | PRN
  Administered 2016-04-27: 1 mg via INTRAVENOUS
  Filled 2016-04-25: qty 1

## 2016-04-25 MED ORDER — INSULIN ASPART 100 UNIT/ML ~~LOC~~ SOLN: 5.0000 [IU] | Freq: Once | SUBCUTANEOUS | Status: DC

## 2016-04-25 MED ORDER — ASPIRIN EC 325 MG PO TBEC
325.0000 mg | DELAYED_RELEASE_TABLET | Freq: Every day | ORAL | Status: DC
  Administered 2016-04-25 – 2016-04-27 (×3): 325 mg via ORAL
  Filled 2016-04-25 (×3): qty 1

## 2016-04-25 MED ORDER — PRAVASTATIN SODIUM 40 MG PO TABS
80.0000 mg | ORAL_TABLET | Freq: Every day | ORAL | Status: DC
  Administered 2016-04-26 – 2016-04-27 (×2): 80 mg via ORAL
  Filled 2016-04-25 (×2): qty 2

## 2016-04-25 MED ORDER — ONDANSETRON HCL 4 MG PO TABS: 4.0000 mg | ORAL_TABLET | Freq: Four times a day (QID) | ORAL | Status: DC | PRN

## 2016-04-25 MED ORDER — DEXTROSE 50 % IV SOLN
INTRAVENOUS | Status: AC
  Administered 2016-04-25: 50 mL via INTRAVENOUS
  Filled 2016-04-25: qty 50

## 2016-04-25 MED ORDER — DEXTROSE-NACL 5-0.45 % IV SOLN
INTRAVENOUS | Status: DC
  Administered 2016-04-25 – 2016-04-26 (×2): via INTRAVENOUS

## 2016-04-25 MED ORDER — ACETAMINOPHEN 650 MG RE SUPP: 650.0000 mg | Freq: Four times a day (QID) | RECTAL | Status: DC | PRN

## 2016-04-25 MED ORDER — HYDRALAZINE HCL 20 MG/ML IJ SOLN: 5.0000 mg | INTRAMUSCULAR | Status: DC | PRN

## 2016-04-25 MED ORDER — FAMOTIDINE 10 MG PO TABS
10.0000 mg | ORAL_TABLET | Freq: Every morning | ORAL | Status: DC
  Administered 2016-04-26 – 2016-04-27 (×2): 10 mg via ORAL
  Filled 2016-04-25 (×2): qty 1

## 2016-04-25 MED ORDER — HEPARIN SODIUM (PORCINE) 5000 UNIT/ML IJ SOLN
5000.0000 [IU] | Freq: Three times a day (TID) | INTRAMUSCULAR | Status: DC
  Administered 2016-04-25 – 2016-04-27 (×5): 5000 [IU] via SUBCUTANEOUS
  Filled 2016-04-25 (×5): qty 1

## 2016-04-25 MED ORDER — LORAZEPAM 2 MG/ML IJ SOLN
1.0000 mg | Freq: Once | INTRAMUSCULAR | Status: AC
  Administered 2016-04-25: 1 mg via INTRAVENOUS
  Filled 2016-04-25: qty 1

## 2016-04-25 MED ORDER — ONDANSETRON HCL 4 MG/2ML IJ SOLN: 4.0000 mg | Freq: Four times a day (QID) | INTRAMUSCULAR | Status: DC | PRN

## 2016-04-25 MED ORDER — CLONIDINE HCL 0.1 MG PO TABS
0.2000 mg | ORAL_TABLET | Freq: Two times a day (BID) | ORAL | Status: DC
  Administered 2016-04-25 – 2016-04-27 (×4): 0.2 mg via ORAL
  Filled 2016-04-25 (×4): qty 2

## 2016-04-25 MED ORDER — STROKE: EARLY STAGES OF RECOVERY BOOK
Freq: Once | Status: AC
  Administered 2016-04-25: 11:00:00
  Filled 2016-04-25: qty 1

## 2016-04-25 MED ORDER — SODIUM CHLORIDE 0.9% FLUSH
3.0000 mL | Freq: Two times a day (BID) | INTRAVENOUS | Status: DC
  Administered 2016-04-25 – 2016-04-26 (×4): 3 mL via INTRAVENOUS

## 2016-04-25 MED ORDER — INSULIN GLARGINE 100 UNIT/ML ~~LOC~~ SOLN
40.0000 [IU] | Freq: Every day | SUBCUTANEOUS | Status: DC
  Administered 2016-04-25 – 2016-04-26 (×2): 40 [IU] via SUBCUTANEOUS
  Filled 2016-04-25 (×3): qty 0.4

## 2016-04-25 MED ORDER — HEPARIN SODIUM (PORCINE) 5000 UNIT/ML IJ SOLN
5000.0000 [IU] | Freq: Three times a day (TID) | INTRAMUSCULAR | Status: DC
  Administered 2016-04-25: 5000 [IU] via SUBCUTANEOUS
  Filled 2016-04-25: qty 1

## 2016-04-25 NOTE — Consult Note (Signed)
Admission H&P    Chief Complaint: Altered mental status with slurred speech.  HPI: Tammy Huff is an 75 y.o. female with a history of diabetes mellitus, hypertension, arthritis, diverticulitis and dementia brought to the emergency room and code stroke status with slurred speech and facial asymmetry. No focal extremity weakness was noted. She has no history of stroke nor TIA. She's been taking aspirin daily. CT scan of her head showed an ill-defined apparent asymmetry with minimal effacement of left frontal sulci. This was thought to possibly be artifact. However, acute ischemic lesion cannot be ruled out. She was last seen well at 12:55 AM today. Patient was noted to be confused at the time of this evaluation. No focal deficits were noted. NIH stroke score was 1. Code stroke was subsequently canceled. MRI is pending. Serum glucose was 411. Urinalysis is pending. Patient was afebrile.  Past Medical History  Diagnosis Date  . Diabetes mellitus without complication (HCC)   . Hypertension   . Arthritis   . Diverticulitis     Past Surgical History  Procedure Laterality Date  . Colon resection      No family history on file. Social History:  reports that she has never smoked. She does not have any smokeless tobacco history on file. She reports that she does not drink alcohol. Her drug history is not on file.  Allergies: No Known Allergies  Medications: Preadmission medications were reviewed by me.  ROS: Unavailable due to patient's dementia.  Physical Examination: Blood pressure 174/93, pulse 76, temperature 97.4 F (36.3 C), temperature source Oral, resp. rate 17, SpO2 96 %.  HEENT-  Normocephalic, no lesions, without obvious abnormality.  Normal external eye and conjunctiva.  Normal TM's bilaterally.  Normal auditory canals and external ears. Normal external nose, mucus membranes and septum.  Normal pharynx. Neck supple with no masses, nodes, nodules or enlargement. Cardiovascular  - regular rate and rhythm, S1, S2 normal, no murmur, click, rub or gallop Lungs - chest clear, no wheezing, rales, normal symmetric air entry Abdomen - soft, non-tender; bowel sounds normal; no masses,  no organomegaly Extremities - no joint deformities, effusion, or inflammation  Neurologic Examination: Mental Status: Alert, oriented to current age but disoriented to current month, no acute distress.  Speech slightly slurred without evidence of aphasia. Able to follow commands without difficulty. Cranial Nerves: II-Visual fields were normal. III/IV/VI-Pupils were equal and reacted normally to light. Extraocular movements were full and conjugate.    V/VII-no facial numbness and no facial weakness. VIII-normal. X-mild dysarthria. XI: trapezius strength/neck flexion strength normal bilaterally XII-midline tongue extension with normal strength. Motor: 5/5 bilaterally with normal tone and bulk Sensory: Responses to tactile sensation was unreliable. Deep Tendon Reflexes: 1+ and symmetric. Plantars: Flexor bilaterally  Results for orders placed or performed during the hospital encounter of 04/25/16 (from the past 48 hour(s))  Protime-INR     Status: None   Collection Time: 04/25/16  1:30 AM  Result Value Ref Range   Prothrombin Time 13.4 11.6 - 15.2 seconds   INR 1.00 0.00 - 1.49  CBC     Status: None (Preliminary result)   Collection Time: 04/25/16  1:30 AM  Result Value Ref Range   WBC PENDING 4.0 - 10.5 K/uL   RBC 4.40 3.87 - 5.11 MIL/uL   Hemoglobin 13.5 12.0 - 15.0 g/dL   HCT 09.8 11.9 - 14.7 %   MCV 86.8 78.0 - 100.0 fL   MCH 30.7 26.0 - 34.0 pg   MCHC 35.3  30.0 - 36.0 g/dL   RDW 95.213.8 84.111.5 - 32.415.5 %   Platelets PENDING 150 - 400 K/uL  Differential     Status: None (Preliminary result)   Collection Time: 04/25/16  1:30 AM  Result Value Ref Range   Neutrophils Relative % PENDING %   Neutro Abs PENDING 1.7 - 7.7 K/uL   Band Neutrophils PENDING %   Lymphocytes Relative PENDING  %   Lymphs Abs PENDING 0.7 - 4.0 K/uL   Monocytes Relative PENDING %   Monocytes Absolute PENDING 0.1 - 1.0 K/uL   Eosinophils Relative PENDING %   Eosinophils Absolute PENDING 0.0 - 0.7 K/uL   Basophils Relative PENDING %   Basophils Absolute PENDING 0.0 - 0.1 K/uL   WBC Morphology PENDING    RBC Morphology PENDING    Smear Review PENDING    nRBC PENDING 0 /100 WBC   Metamyelocytes Relative PENDING %   Myelocytes PENDING %   Promyelocytes Absolute PENDING %   Blasts PENDING %  I-stat troponin, ED (not at [facility], [facility])     Status: None   Collection Time: 04/25/16  1:34 AM  Result Value Ref Range   Troponin i, poc 0.01 0.00 - 0.08 ng/mL   Comment 3            Comment: Due to the release kinetics of cTnI, a negative result within the first hours of the onset of symptoms does not rule out myocardial infarction with certainty. If myocardial infarction is still suspected, repeat the test at appropriate intervals.   I-Stat Chem 8, ED  (not at [facility], [facility])     Status: Abnormal   Collection Time: 04/25/16  1:36 AM  Result Value Ref Range   Sodium 135 135 - 145 mmol/L   Potassium 3.5 3.5 - 5.1 mmol/L   Chloride 95 (L) 101 - 111 mmol/L   BUN 12 6 - 20 mg/dL   Creatinine, Ser 4.011.40 (H) 0.44 - 1.00 mg/dL   Glucose, Bld 027411 (H) 65 - 99 mg/dL   Calcium, Ion 2.531.01 (L) 1.12 - 1.23 mmol/L   TCO2 27 0 - 100 mmol/L   Hemoglobin 14.6 12.0 - 15.0 g/dL   HCT 66.443.0 40.336.0 - 47.446.0 %  CBG monitoring, ED     Status: Abnormal   Collection Time: 04/25/16  1:48 AM  Result Value Ref Range   Glucose-Capillary 398 (H) 65 - 99 mg/dL   Comment 1 Notify RN    Comment 2 Document in Chart    Ct Head Code Stroke W/o Cm  04/25/2016  CLINICAL DATA:  75 year old female with right-sided weakness and confusion. Slurred speech. Code stroke. EXAM: CT HEAD WITHOUT CONTRAST TECHNIQUE: Contiguous axial images were obtained from the base of the skull through the vertex without intravenous contrast. COMPARISON:  Brain MRI dated  08/12/2014 and head CT dated 08/12/2014 FINDINGS: There is no acute intracranial hemorrhage. There is mild age-related atrophy and chronic microvascular ischemic changes. There is an ill-defined area of apparent asymmetry in the frontal lobes with apparent asymmetric prominence of the left frontal white matter and effacement of the adjacent sulci (best seen on series 201, image 22). Although this may be artifactual and related to patient positioning in the scanner, an acute ischemia is not excluded. Clinical correlation is recommended. There is no mass effect or midline shift. The visualized paranasal sinuses and mastoid air cells are clear. The calvarium is intact. IMPRESSION: No acute intracranial hemorrhage. Ill-defined apparent asymmetry with minimal effacement of the left  frontal sulci. This may be artifactual and related to patient positioning. Acute ischemia is not excluded. Clinical correlation is recommended. MRI is recommended for further evaluation. These results were called by telephone at the time of interpretation on 04/25/2016 at 1:44 am to Dr. Roseanne RenoStewart, who verbally acknowledged these results. Electronically Signed   By: Elgie CollardArash  Radparvar M.D.   On: 04/25/2016 01:44    Assessment/Plan 75 year old lady presenting with altered mental status which is most likely secondary to multiple factors, including hyperglycemia, in addition to patient's underlying moderate to severe dementia. Urinalysis is pending. Stroke is unlikely but cannot be completely ruled out. MRI of the brain is pending.  Recommendations: 1. MRI of the brain without contrast 2. Stroke workup if MRI demonstrates acute cerebral infarction 3. Management of hyperglycemia per primary admitting team 4. No changes in current medications recommended  We will continue to follow this patient with you.  C.R. Roseanne RenoStewart, MD [facility] [phone]  04/25/2016, 2:05 AM

## 2016-04-25 NOTE — Progress Notes (Signed)
Call received from individual identifying herself as patient's daughter, [name].  No password established.  Individual requested detailed information as to patient status, POC and treatment goals.  Informed individual that patient is stable in NAD with continuing POC as previously outlined.  Individual encouraged to establish password and main contact person in order to protect patient's privacy.  Patient currently stable and resting in NAD.  Safety attendant present at bedside.  No further needs or concerns at this time.  Will continue to monitor closely.

## 2016-04-25 NOTE — Progress Notes (Signed)
Pt to CT

## 2016-04-25 NOTE — Progress Notes (Signed)
Pt off the floor to test.  

## 2016-04-25 NOTE — Progress Notes (Signed)
No charge note  Saw patient briefly. She was lying in bed with covers pulled tightly over her head. She did not respond to questions. Attempted to call daughter, [name], at all numbers listed. Unable to contact.  Will attempt to contact family again tomorrow.  Ocie BobKasie Joey Lierman, AGNP-C Palliative Medicine  Team phone: [phone] or can reach individual staff by [facility]

## 2016-04-25 NOTE — Progress Notes (Signed)
Throughout the day pt has not been following commands. Will not open eyes even. Has not follow commands. There was one instance around 1100 that pt sat up saying " I need to use the bathroom" and ripped off her gown. rn told pt and daughter to wait one minute while rn got a bed pan. By the time rn came back into room pt had started urinating and was not following commands. Tech gave pt bath and changed linen. Pt was able to hold her arms ups and had good strength. Still not following commands well, pt held her arms up for several minutes even tho tech told pt that she could lower her arms.   After pt was cleaned up, pt went back to keeping her eyes closed and not following commands. Pt once today was able to say her name and birthday (Month and Day), all other times pt has not been able to answer these questions.

## 2016-04-25 NOTE — Evaluation (Signed)
Clinical/Bedside Swallow Evaluation Patient Details  Name: Tammy Huff MRN: [id number] Date of Birth: 10/14/1941  Today's Date: 04/25/2016 Time: SLP Start Time (ACUTE ONLY): 1450 SLP Stop Time (ACUTE ONLY): 1515 SLP Time Calculation (min) (ACUTE ONLY): 25 min  Past Medical History:  Past Medical History  Diagnosis Date  . Diabetes mellitus without complication (HCC)   . Hypertension   . Arthritis   . Diverticulitis   . Dementia     advanced   Past Surgical History:  Past Surgical History  Procedure Laterality Date  . Colon resection     HPI:  Pt is a 75 y.o. female with PMH of dementia, diabetes, HTN, dementia, HTN, DM present from nursing home, admitted to ED 6/28 with complaint of slurred speech, ED initially noting possible R facial droop; reportedly symptoms appeared to be resolving soon after evaluation. Head CT clear, pt not tolerating MRI. RN reported pt not following commands and not opening eyes today. Bedside swallow eval ordered as pt did not pass RN stroke swallow screen- not awake/ alert.    Assessment / Plan / Recommendation Clinical Impression  Pt showed no overt s/s of aspiration with trials of puree and thin liquids by straw. Swallow appeared timely; however, pt was lethargic during evaluation, opening eyes minimally, and not speaking- only responding in grunts. Pt needed max support for encouragement to participate- initially repeatedly turned head away from bolus presentation; when cup was placed in pt's hand with hand-over-hand assist to bring to mouth, pt willingly took sips and then accepted several bites of puree. Although no s/s of aspiration noted, risk is increased given pt's lethargy and cognitive status; risk appears mild-moderate at this time. Recommend initating dysphagia 1 diet, thin liquids, meds crushed in puree, full supervision- allow pt to self-feed as much as possible providing hand-over-hand assist with cup for liquids, monitor alertness. Will  continue to follow for diet tolerance/ consider advancement- did not trial solids given lethargy and cues needed to maintain wakeful state.    Aspiration Risk  Mild aspiration risk;Moderate aspiration risk    Diet Recommendation Dysphagia 1 (Puree);Thin liquid   Liquid Administration via: Straw Medication Administration: Crushed with puree Supervision: Staff to assist with self feeding;Full supervision/cueing for compensatory strategies Compensations: Minimize environmental distractions;Slow rate;Small sips/bites Postural Changes: Seated upright at 90 degrees    Other  Recommendations Oral Care Recommendations: Oral care BID   Follow up Recommendations  Other (comment) (TBD)    Frequency and Duration min 2x/week  1 week       Prognosis Prognosis for Safe Diet Advancement: Fair Barriers to Reach Goals: Cognitive deficits;Behavior      Swallow Study   General HPI: Pt is a 75 y.o. female with PMH of dementia, diabetes, HTN, dementia, HTN, DM present from nursing home, admitted to ED 6/28 with complaint of slurred speech, ED initially noting possible R facial droop; reportedly symptoms appeared to be resolving soon after evaluation. Head CT clear, pt not tolerating MRI. RN reported pt not following commands and not opening eyes today. Bedside swallow eval ordered as pt did not pass RN stroke swallow screen- not awake/ alert.  Type of Study: Bedside Swallow Evaluation Diet Prior to this Study: NPO Temperature Spikes Noted: No Respiratory Status: Room air History of Recent Intubation: No Behavior/Cognition: Lethargic/Drowsy;Requires cueing;Doesn't follow directions Oral Cavity Assessment: Within Functional Limits Oral Cavity - Dentition: Missing dentition;Poor condition Vision:  (opening eyes minimally) Self-Feeding Abilities: Needs assist Patient Positioning: Upright in bed Baseline Vocal Quality: Low vocal  intensity Volitional Cough: Cognitively unable to elicit Volitional  Swallow: Unable to elicit    Oral/Motor/Sensory Function Overall Oral Motor/Sensory Function:  (difficult to assess due to cognitive impairment)   Ice Chips Ice chips: Not tested   Thin Liquid Thin Liquid: Within functional limits Presentation: Straw    Nectar Thick Nectar Thick Liquid: Not tested   Honey Thick Honey Thick Liquid: Not tested   Puree Puree: Within functional limits Presentation: Spoon   Solid   GO   Solid: Not tested    Functional Assessment Tool Used: clinical judgment Functional Limitations: Swallowing Swallow Current Status ([id number]): At least 20 percent but less than 40 percent impaired, limited or restricted Swallow Goal Status [phone]([id number]): At least 1 percent but less than 20 percent impaired, limited or restricted   Tammy Huff, Tammy Huff 04/25/2016,3:25 PM

## 2016-04-25 NOTE — ED Provider Notes (Signed)
CSN: [id number]     Arrival date & time 04/25/16  0123 History   By signing my name below, I, Evon Slackerrance Branch, attest that this documentation has been prepared under the direction and in the presence of Dione Boozeavid Shomari Matusik, MD. Electronically Signed: Evon Slackerrance Branch, ED Scribe. 04/25/2016. 1:30 AM.   Chief Complaint  Patient presents with  . Code Stroke    The history is provided by the EMS personnel. No language interpreter was used.   HPI Comments: Level 5 Caveat due to Dementia  Tammy Huff is a 75 y.o. female brought in by ambulance, who presents to the Emergency Department for possible code stroke. Pt was last seen normal last 12:50 AM. Per Ems her daughetr states that she is having speech difficulty.   Past Medical History  Diagnosis Date  . Diabetes mellitus without complication (HCC)   . Hypertension   . Arthritis   . Diverticulitis    Past Surgical History  Procedure Laterality Date  . Colon resection     No family history on file. Social History  Substance Use Topics  . Smoking status: Never Smoker   . Smokeless tobacco: None  . Alcohol Use: No   OB History    No data available     Review of Systems  Unable to perform ROS: Dementia      Allergies  Review of patient's allergies indicates no known allergies.  Home Medications   Prior to Admission medications   Medication Sig Start Date End Date Taking? Authorizing Provider  amLODipine (NORVASC) 10 MG tablet Take 10 mg by mouth every evening.    Historical Provider, MD  aspirin EC 325 MG EC tablet Take 1 tablet (325 mg total) by mouth daily. 08/12/14   Richarda OverlieNayana Abrol, MD  carvedilol (COREG) 6.25 MG tablet Take 1 tablet (6.25 mg total) by mouth 2 (two) times daily with a meal. Reported on 12/26/2015 03/14/16   Dione Boozeavid Surafel Hilleary, MD  cloNIDine (CATAPRES) 0.2 MG tablet Take 1 tablet (0.2 mg total) by mouth 2 (two) times daily. 03/14/16   Dione Boozeavid Milbert Bixler, MD  diphenhydrAMINE (BENADRYL) 25 MG tablet Take 25 mg by mouth daily as  needed for allergies.     Historical Provider, MD  famotidine (PEPCID AC) 10 MG chewable tablet Chew 10 mg by mouth every morning.     Historical Provider, MD  insulin aspart (NOVOLOG FLEXPEN) 100 UNIT/ML FlexPen Inject 4 Units into the skin 3 (three) times daily with meals. 03/14/16   Dione Boozeavid Dejay Kronk, MD  Insulin Glargine (LANTUS SOLOSTAR) 100 UNIT/ML Solostar Pen Inject 40 Units into the skin daily at 10 pm. 03/14/16   Dione Boozeavid Wilfred Siverson, MD  Insulin Pen Needle (RELION PEN NEEDLE 31G/8MM) 31G X 8 MM MISC Use as directed. 03/14/16   Dione Boozeavid Esperansa Sarabia, MD  Iron-Vitamins (GERITOL PO) Take 1 tablet by mouth every morning.    Historical Provider, MD  losartan-hydrochlorothiazide (HYZAAR) 100-25 MG tablet Take 1 tablet by mouth daily. 03/14/16   Dione Boozeavid Aara Jacquot, MD  pravastatin (PRAVACHOL) 80 MG tablet Take 1 tablet (80 mg total) by mouth every morning. 03/14/16   Dione Boozeavid Sonakshi Rolland, MD   BP 174/93 mmHg  Pulse 76  Temp(Src) 97.4 F (36.3 C) (Oral)  Resp 17  SpO2 96%   Physical Exam  Constitutional: She appears well-developed and well-nourished. No distress.  HENT:  Head: Normocephalic and atraumatic.  Eyes: Conjunctivae and EOM are normal. Pupils are equal, round, and reactive to light.  Neck: Normal range of motion. Neck supple. No JVD present.  Carotid bruit is not present.  Cardiovascular: Normal rate, regular rhythm and normal heart sounds.   No murmur heard. Pulmonary/Chest: Effort normal and breath sounds normal. She has no wheezes. She has no rales. She exhibits no tenderness.  Abdominal: Soft. Bowel sounds are normal. She exhibits no distension and no mass. There is no tenderness.  Musculoskeletal: Normal range of motion.  1+ edema   Lymphadenopathy:    She has no cervical adenopathy.  Neurological: She is alert. She exhibits normal muscle tone. Coordination normal.  Awake but follows commands poorly, questionable right central facial droop, speech dysarthric, no other focal motor deficits.   Skin: Skin is warm  and dry. No rash noted.  Nursing note and vitals reviewed.   ED Course  Procedures (including critical care time) DIAGNOSTIC STUDIES: Oxygen Saturation is 96% on RA, normal by my interpretation.    COORDINATION OF CARE:    Labs Review Labs Reviewed  COMPREHENSIVE METABOLIC PANEL - Abnormal; Notable for the following:    Sodium 131 (*)    Potassium 3.3 (*)    Chloride 93 (*)    CO2 21 (*)    Glucose, Bld 406 (*)    Creatinine, Ser 1.66 (*)    Calcium 8.7 (*)    Albumin 3.2 (*)    ALT 11 (*)    Total Bilirubin 1.3 (*)    GFR calc non Af Amer 29 (*)    GFR calc Af Amer 33 (*)    Anion gap 17 (*)    All other components within normal limits  I-STAT CHEM 8, ED - Abnormal; Notable for the following:    Chloride 95 (*)    Creatinine, Ser 1.40 (*)    Glucose, Bld 411 (*)    Calcium, Ion 1.01 (*)    All other components within normal limits  CBG MONITORING, ED - Abnormal; Notable for the following:    Glucose-Capillary 398 (*)    All other components within normal limits  PROTIME-INR  CBC  DIFFERENTIAL  ETHANOL  APTT  URINE RAPID DRUG SCREEN, HOSP PERFORMED  URINALYSIS, ROUTINE W REFLEX MICROSCOPIC (NOT [facility])  I-STAT TROPOININ, ED    Imaging Review Ct Head Code Stroke W/o Cm  04/25/2016  CLINICAL DATA:  75 year old female with right-sided weakness and confusion. Slurred speech. Code stroke. EXAM: CT HEAD WITHOUT CONTRAST TECHNIQUE: Contiguous axial images were obtained from the base of the skull through the vertex without intravenous contrast. COMPARISON:  Brain MRI dated 08/12/2014 and head CT dated 08/12/2014 FINDINGS: There is no acute intracranial hemorrhage. There is mild age-related atrophy and chronic microvascular ischemic changes. There is an ill-defined area of apparent asymmetry in the frontal lobes with apparent asymmetric prominence of the left frontal white matter and effacement of the adjacent sulci (best seen on series 201, image 22). Although this may be  artifactual and related to patient positioning in the scanner, an acute ischemia is not excluded. Clinical correlation is recommended. There is no mass effect or midline shift. The visualized paranasal sinuses and mastoid air cells are clear. The calvarium is intact. IMPRESSION: No acute intracranial hemorrhage. Ill-defined apparent asymmetry with minimal effacement of the left frontal sulci. This may be artifactual and related to patient positioning. Acute ischemia is not excluded. Clinical correlation is recommended. MRI is recommended for further evaluation. These results were called by telephone at the time of interpretation on 04/25/2016 at 1:44 am to Dr. Roseanne RenoStewart, who verbally acknowledged these results. Electronically Signed   By: Burtis JunesArash  Radparvar M.D.   On: 04/25/2016 01:44   I have personally reviewed and evaluated these images and lab results as part of my medical decision-making.   EKG Interpretation   Date/Time:  Wednesday April 25 2016 01:45:19 EDT Ventricular Rate:  75 PR Interval:    QRS Duration: 95 QT Interval:  406 QTC Calculation: 454 R Axis:   11 Text Interpretation:  Sinus rhythm Probable left atrial enlargement  Borderline T abnormalities, inferior leads When compared with ECG of  08/12/2014, No significant change was found Confirmed by [facility]  MD, Ruhee Enck  ([id number]) on 04/25/2016 1:51:31 AM      CRITICAL CARE Performed by: UEAVW,[email or web address] Total critical care time: 353 minutes Critical care time was exclusive of separately billable procedures and treating other patients. Critical care was necessary to treat or prevent imminent or life-threatening deterioration. Critical care was time spent personally by me on the following activities: development of treatment plan with patient and/or surrogate as well as nursing, discussions with consultants, evaluation of patient's response to treatment, examination of patient, obtaining history from patient or surrogate, ordering and performing  treatments and interventions, ordering and review of laboratory studies, ordering and review of radiographic studies, pulse oximetry and re-evaluation of patient's condition. MDM   Final diagnoses:  Transient cerebral ischemia, unspecified transient cerebral ischemia type  Renal insufficiency  Hyponatremia  Hypokalemia      Patient arrived by EMS as a code stroke with right facial droop. This resolved fairly soon after arrival. Patient was seen in conjunction with Dr. Roseanne Reno of neurology service. Code stroke was canceled. She has been sent for MRI but was unable to hold still enough to get an adequate image. Old records are reviewed and she was seen in the ED about 6 weeks ago for hyperglycemia. She has significant underlying dementia. She will be admitted for TIA workup. Case is discussed with Dr. Antionette Char of [facility] who agrees to admit the patient under observation status.   I personally performed the services described in this documentation, which was scribed in my presence. The recorded information has been reviewed and is accurate.       Dione Booze, MD 04/25/16 [phone]

## 2016-04-25 NOTE — Progress Notes (Signed)
VASCULAR LAB PRELIMINARY  PRELIMINARY  PRELIMINARY  PRELIMINARY  Carotid duplex completed.    Preliminary report:   Bilateral - No evidence of ICA stenosis. Vertebral artery flow is antegrade.  Jimya Ciani, RVS 04/25/2016, 1:13 PM

## 2016-04-25 NOTE — Progress Notes (Signed)
Pt had low CBG, was given dextrose amp, new IV fluids. Pt became more alert, alert enough to eat dinner. Sitter fed pt. rn then assessed pt. Pt alert and oriented x3. Following commands. Very dramatic change in presentation compared to earlier in shift.

## 2016-04-25 NOTE — ED Notes (Signed)
Pt's daughter [name] left her contact [phone]

## 2016-04-25 NOTE — Progress Notes (Signed)
Code Stroke called on 75 y.o female with a history of diabetes mellitus, hypertension, arthritis, diverticulitis and dementia brought to the emergency room and code stroke status with slurred speech and facial asymmetry. Pt takes aspirin daily. LSN 0050 per family at home. CT completed STAT, negative for ICH. CBG 398. NIHSS completed yielding 1 for confusion only, no focal deficits. Facial droop and slurred [facility] Pt for MRI tonight. Code stroke canceled per Dr. Roseanne RenoStewart.

## 2016-04-25 NOTE — ED Notes (Signed)
Patient lives at home with daughter, patient was picking up remote and started having slurred speech, daughter called EMS at [id number]. EMS arrived stated patient had right side facial droop, equal grips bilaterally, no weakness. Patient had slurred speech per EMS arrival. Hx of dementia, diabetes, HTN. 18 G LAC EMS placed.

## 2016-04-25 NOTE — H&P (Signed)
History and Physical    Tammy KalataGracie C Huff ZOX:[id number] DOB: 05/19/1941 DOA: 04/25/2016  PCP: Massie MaroonHollis,Lachina M, FNP Patient coming from: SNF  Chief Complaint: slurred speech  HPI: Tammy KalataGracie C Huff is a 75 y.o. female with medical history significant of dementia, diabetes, HTN, dementia, HTN, DM present from nursing home with complaint of slurred speech. Level V caveat applies as patient is unable to provide history. History provided by EDP report, accepting physician report, nursing home staff notes, and patient's daughter Jonetta SpeakLuis. Last known normal 12:50 AM. Just prior to arrival pt was noted by SNF staff to have  difficulty speaking and slurred speech. EDP initially reported possible right facial droop. Code stroke was initiated and patient was evaluated immediately by neurology. Symptoms appear to be resolving or soon after evaluation and culture was canceled. Stroke workup initiated as outlined below. No history of previous stroke.    ED Course: Objective findings outlined below. No evidence of hypo-glycemia, infectious etiology or seizure-like activity.  Review of Systems: As per HPI otherwise 10 point review of systems negative.   Ambulatory Status: Minimally ambulatory  Past Medical History  Diagnosis Date  . Diabetes mellitus without complication (HCC)   . Hypertension   . Arthritis   . Diverticulitis   . Dementia     advanced    Past Surgical History  Procedure Laterality Date  . Colon resection      Social History   Social History  . Marital Status: Widowed    Spouse Name: N/A  . Number of Children: N/A  . Years of Education: N/A   Occupational History  . Not on file.   Social History Main Topics  . Smoking status: Never Smoker   . Smokeless tobacco: Not on file  . Alcohol Use: No  . Drug Use: Not on file  . Sexual Activity: Not on file   Other Topics Concern  . Not on file   Social History Narrative    No Known Allergies  Family History  Problem  Relation Age of Onset  . Family history unknown: Yes    Prior to Admission medications   Medication Sig Start Date End Date Taking? Authorizing Provider  amLODipine (NORVASC) 10 MG tablet Take 10 mg by mouth every evening.    Historical Provider, MD  aspirin EC 325 MG EC tablet Take 1 tablet (325 mg total) by mouth daily. 08/12/14   Richarda OverlieNayana Abrol, MD  carvedilol (COREG) 6.25 MG tablet Take 1 tablet (6.25 mg total) by mouth 2 (two) times daily with a meal. Reported on 12/26/2015 03/14/16   Dione Boozeavid Glick, MD  cloNIDine (CATAPRES) 0.2 MG tablet Take 1 tablet (0.2 mg total) by mouth 2 (two) times daily. 03/14/16   Dione Boozeavid Glick, MD  diphenhydrAMINE (BENADRYL) 25 MG tablet Take 25 mg by mouth daily as needed for allergies.     Historical Provider, MD  famotidine (PEPCID AC) 10 MG chewable tablet Chew 10 mg by mouth every morning.     Historical Provider, MD  insulin aspart (NOVOLOG FLEXPEN) 100 UNIT/ML FlexPen Inject 4 Units into the skin 3 (three) times daily with meals. 03/14/16   Dione Boozeavid Glick, MD  Insulin Glargine (LANTUS SOLOSTAR) 100 UNIT/ML Solostar Pen Inject 40 Units into the skin daily at 10 pm. 03/14/16   Dione Boozeavid Glick, MD  Insulin Pen Needle (RELION PEN NEEDLE 31G/8MM) 31G X 8 MM MISC Use as directed. 03/14/16   Dione Boozeavid Glick, MD  Iron-Vitamins (GERITOL PO) Take 1 tablet by mouth every morning.  Historical Provider, MD  losartan-hydrochlorothiazide (HYZAAR) 100-25 MG tablet Take 1 tablet by mouth daily. 03/14/16   Dione Booze, MD  pravastatin (PRAVACHOL) 80 MG tablet Take 1 tablet (80 mg total) by mouth every morning. 03/14/16   Dione Booze, MD    Physical Exam: Filed Vitals:   04/25/16 0200 04/25/16 0357 04/25/16 0402 04/25/16 0629  BP: 166/83 112/96 180/77 153/95  Pulse: 71 64 69 75  Temp:    98.1 F (36.7 C)  TempSrc:    Oral  Resp: SpO2: 98% 98% 98% 99%     General:  Appears calm and comfortable Eyes:  normal lids,Patient not willing to open eyes at this time. ENT:  grossly  normal hearing, lips & tongue, mmm Neck:  no LAD, masses or thyromegaly Cardiovascular:  RRR, No LE edema.  Respiratory:  CTA bilaterally, no w/r/r. Normal respiratory effort. Abdomen:  soft, ntnd, NABS Skin:  no rash or induration seen on limited exam Musculoskeletal:  grossly normal tone BUE/BLE, no bony abnormality Psychiatric: Minimally participatory. Follows very basic hands intermittently. Noncommunicative. Neurologic:  Difficult to fully asertain due to pts dementia. moves all extremities in coordinated fashion, sensation intact  Labs on Admission: I have personally reviewed following labs and imaging studies  CBC:  Recent Labs Lab 04/25/16 0130 04/25/16 0136  WBC 5.5  --   NEUTROABS 2.8  --   HGB 13.5 14.6  HCT 38.2 43.0  MCV 86.8  --   PLT PLATELETS APPEAR DECREASED  --    Basic Metabolic Panel:  Recent Labs Lab 04/25/16 0130 04/25/16 0136  NA 131* 135  K 3.3* 3.5  CL 93* 95*  CO2 21*  --   GLUCOSE 406* 411*  BUN 9 12  CREATININE 1.66* 1.40*  CALCIUM 8.7*  --    GFR: CrCl cannot be calculated (Unknown ideal weight.). Liver Function Tests:  Recent Labs Lab 04/25/16 0130  AST 17  ALT 11*  ALKPHOS 77  BILITOT 1.3*  PROT 7.0  ALBUMIN 3.2*   No results for input(s): LIPASE, AMYLASE in the last 168 hours. No results for input(s): AMMONIA in the last 168 hours. Coagulation Profile:  Recent Labs Lab 04/25/16 0130  INR 1.00   Cardiac Enzymes: No results for input(s): CKTOTAL, CKMB, CKMBINDEX, TROPONINI in the last 168 hours. BNP (last 3 results) No results for input(s): PROBNP in the last 8760 hours. HbA1C: No results for input(s): HGBA1C in the last 72 hours. CBG:  Recent Labs Lab 04/25/16 0148  GLUCAP 398*   Lipid Profile: No results for input(s): CHOL, HDL, LDLCALC, TRIG, CHOLHDL, LDLDIRECT in the last 72 hours. Thyroid Function Tests: No results for input(s): TSH, T4TOTAL, FREET4, T3FREE, THYROIDAB in the last 72 hours. Anemia  Panel: No results for input(s): VITAMINB12, FOLATE, FERRITIN, TIBC, IRON, RETICCTPCT in the last 72 hours. Urine analysis:    Component Value Date/Time   COLORURINE YELLOW 03/14/2016 0358   APPEARANCEUR CLEAR 03/14/2016 0358   LABSPEC 1.026 03/14/2016 0358   PHURINE 6.5 03/14/2016 0358   GLUCOSEU >1000* 03/14/2016 0358   HGBUR TRACE* 03/14/2016 0358   BILIRUBINUR NEGATIVE 03/14/2016 0358   KETONESUR NEGATIVE 03/14/2016 0358   PROTEINUR 100* 03/14/2016 0358   UROBILINOGEN 0.2 12/26/2015 1530   NITRITE NEGATIVE 03/14/2016 0358   LEUKOCYTESUR NEGATIVE 03/14/2016 0358    Creatinine Clearance: CrCl cannot be calculated (Unknown ideal weight.).  Sepsis Labs: (procalcitonin:4,lacticidven:4) )No results found for this or any previous visit (from the past 240 hour(s)).  Radiological Exams on Admission: Ct Head Code Stroke W/o Cm  04/25/2016  CLINICAL DATA:  75 year old female with right-sided weakness and confusion. Slurred speech. Code stroke. EXAM: CT HEAD WITHOUT CONTRAST TECHNIQUE: Contiguous axial images were obtained from the base of the skull through the vertex without intravenous contrast. COMPARISON:  Brain MRI dated 08/12/2014 and head CT dated 08/12/2014 FINDINGS: There is no acute intracranial hemorrhage. There is mild age-related atrophy and chronic microvascular ischemic changes. There is an ill-defined area of apparent asymmetry in the frontal lobes with apparent asymmetric prominence of the left frontal white matter and effacement of the adjacent sulci (best seen on series 201, image 22). Although this may be artifactual and related to patient positioning in the scanner, an acute ischemia is not excluded. Clinical correlation is recommended. There is no mass effect or midline shift. The visualized paranasal sinuses and mastoid air cells are clear. The calvarium is intact. IMPRESSION: No acute intracranial hemorrhage. Ill-defined apparent asymmetry with minimal effacement  of the left frontal sulci. This may be artifactual and related to patient positioning. Acute ischemia is not excluded. Clinical correlation is recommended. MRI is recommended for further evaluation. These results were called by telephone at the time of interpretation on 04/25/2016 at 1:44 am to Dr. Roseanne RenoStewart, who verbally acknowledged these results. Electronically Signed   By: Elgie CollardArash  Radparvar M.D.   On: 04/25/2016 01:44    EKG: Independently reviewed. No arrythmia. Sinus. No ACS  Assessment/Plan Active Problems:   HTN (hypertension)   DM hyperosmolarity type II, uncontrolled (HCC)   Transient ischemia   CKD (chronic kidney disease)   Dementia with behavioral disturbance   GERD (gastroesophageal reflux disease)   Slurred speech   Slurred Speech: resolving. TIA workup initiated. Pt could not tolerate MRI in ED. CT w/o definitive acute process.  - Tele - Carotid dopplers - CTA head/neck - SLP - PT/OT - Neuro following - ASA, Statin - UA  Dementia: appears at baseline. Intermittently combative. Occasionally aware of suroundings. Bad sundowning per family. - Haldol, ativan PRN  HTN: Allow permissive HTN - hold home Cored, Norvasc, HCTZ, Losartan - Continue clonidine to prevent rebound - Hydralazine PRN  Social: discussed case w/ daughter [name].  - Palliative care consult for GOC and POA  CKD: Cr 1.4. Improved from baseline - BMET  GERD: - continue pepcid  DM: - continue lantus - SSI   DVT prophylaxis:SCD  Code Status: FULL  Family Communication: daughter  Disposition Plan: pending workup   Consults called: Neuro  Admission status: Observation    Leandrew Keech J MD [facility]  If 7PM-7AM, please contact night-coverage [email or web address] Password [email or web address]  04/25/2016, 8:35 AM

## 2016-04-26 ENCOUNTER — Observation Stay (HOSPITAL_BASED_OUTPATIENT_CLINIC_OR_DEPARTMENT_OTHER): Payer: Medicare PPO

## 2016-04-26 DIAGNOSIS — I129 Hypertensive chronic kidney disease with stage 1 through stage 4 chronic kidney disease, or unspecified chronic kidney disease: Secondary | ICD-10-CM | POA: Diagnosis not present

## 2016-04-26 DIAGNOSIS — Z515 Encounter for palliative care: Secondary | ICD-10-CM | POA: Diagnosis not present

## 2016-04-26 DIAGNOSIS — G459 Transient cerebral ischemic attack, unspecified: Secondary | ICD-10-CM | POA: Diagnosis not present

## 2016-04-26 DIAGNOSIS — Z7189 Other specified counseling: Secondary | ICD-10-CM | POA: Diagnosis not present

## 2016-04-26 DIAGNOSIS — I6789 Other cerebrovascular disease: Secondary | ICD-10-CM

## 2016-04-26 DIAGNOSIS — F0391 Unspecified dementia with behavioral disturbance: Secondary | ICD-10-CM | POA: Diagnosis not present

## 2016-04-26 LAB — ECHOCARDIOGRAM COMPLETE
E decel time: 282 msec
EERAT: 10.99
FS: 24 % — AB (ref 28–44)
IV/PV OW: 1.13
LA diam end sys: 32 mm
LA diam index: 1.62 cm/m2
LA vol A4C: 32.5 ml
LA vol index: 17.4 mL/m2
LASIZE: 32 mm
LAVOL: 34.5 mL
LDCA: 3.14 cm2
LV E/e' medial: 10.99
LV E/e'average: 10.99
LV PW d: 11.2 mm — AB (ref 0.6–1.1)
LVELAT: 5.43 cm/s
LVOTD: 20 mm
MV Dec: 282
MVPKAVEL: 91.7 m/s
MVPKEVEL: 59.7 m/s
TDI e' lateral: 5.43
TDI e' medial: 3.19

## 2016-04-26 LAB — GLUCOSE, CAPILLARY
GLUCOSE-CAPILLARY: 112 mg/dL — AB (ref 65–99)
GLUCOSE-CAPILLARY: 231 mg/dL — AB (ref 65–99)
Glucose-Capillary: 168 mg/dL — ABNORMAL HIGH (ref 65–99)

## 2016-04-26 LAB — LIPID PANEL
CHOL/HDL RATIO: 4.6 ratio
CHOLESTEROL: 204 mg/dL — AB (ref 0–200)
HDL: 44 mg/dL (ref >40)
LDL Cholesterol: 138 mg/dL — ABNORMAL HIGH (ref 0–99)
Triglycerides: 109 mg/dL (ref <150)
VLDL: 22 mg/dL (ref 0–40)

## 2016-04-26 LAB — CBC
HCT: 33.1 % — ABNORMAL LOW (ref 36.0–46.0)
Hemoglobin: 11.4 g/dL — ABNORMAL LOW (ref 12.0–15.0)
MCH: 30.2 pg (ref 26.0–34.0)
MCHC: 34.4 g/dL (ref 30.0–36.0)
MCV: 87.6 fL (ref 78.0–100.0)
PLATELETS: 225 10*3/uL (ref 150–400)
RBC: 3.78 MIL/uL — AB (ref 3.87–5.11)
RDW: 14 % (ref 11.5–15.5)
WBC: 5.3 10*3/uL (ref 4.0–10.5)

## 2016-04-26 LAB — HEMOGLOBIN A1C
Hgb A1c MFr Bld: 13.3 % — ABNORMAL HIGH (ref 4.8–5.6)
MEAN PLASMA GLUCOSE: 335 mg/dL

## 2016-04-26 LAB — BASIC METABOLIC PANEL
Anion gap: 8 (ref 5–15)
BUN: 7 mg/dL (ref 6–20)
CO2: 30 mmol/L (ref 22–32)
Calcium: 8.4 mg/dL — ABNORMAL LOW (ref 8.9–10.3)
Chloride: 101 mmol/L (ref 101–111)
Creatinine, Ser: 1.64 mg/dL — ABNORMAL HIGH (ref 0.44–1.00)
GFR calc Af Amer: 34 mL/min — ABNORMAL LOW (ref >60)
GFR, EST NON AFRICAN AMERICAN: 29 mL/min — AB (ref >60)
GLUCOSE: 297 mg/dL — AB (ref 65–99)
POTASSIUM: 2.9 mmol/L — AB (ref 3.5–5.1)
SODIUM: 139 mmol/L (ref 135–145)

## 2016-04-26 MED ORDER — POTASSIUM CHLORIDE 20 MEQ/15ML (10%) PO SOLN
40.0000 meq | Freq: Four times a day (QID) | ORAL | Status: AC
  Administered 2016-04-26: 20 meq via ORAL
  Filled 2016-04-26: qty 30

## 2016-04-26 MED ORDER — POTASSIUM CHLORIDE 10 MEQ/100ML IV SOLN
10.0000 meq | INTRAVENOUS | Status: AC
  Administered 2016-04-26 – 2016-04-27 (×6): 10 meq via INTRAVENOUS
  Filled 2016-04-26 (×5): qty 100

## 2016-04-26 MED ORDER — POTASSIUM CHLORIDE CRYS ER 20 MEQ PO TBCR
40.0000 meq | EXTENDED_RELEASE_TABLET | Freq: Four times a day (QID) | ORAL | Status: DC
  Administered 2016-04-26: 40 meq via ORAL
  Filled 2016-04-26: qty 2

## 2016-04-26 NOTE — Progress Notes (Signed)
Pt off the floor to test.  

## 2016-04-26 NOTE — Progress Notes (Signed)
  Echocardiogram 2D Echocardiogram has been performed.  Cathie BeamsGREGORY, Yaeko Fazekas 04/26/2016, 12:50 PM

## 2016-04-26 NOTE — Progress Notes (Signed)
Pt refusing to take potassium medicine. Pt drank maybe 1/4 of 1 container. Saying "rn is trying to cut her lips, and you don't do people like that". Pt glaring at nurse and has hands clenched. rn was able to quickly give pt heparin shot, but pt was not happy.

## 2016-04-26 NOTE — Care Management Note (Addendum)
Case Management Note  Patient Details  Name: Lyda KalataGracie C Lanter MRN: [id number] Date of Birth: 12/24/1940  Subjective/Objective:     Pt in with TIA. CT angio was negative. She is from home with daughter.                Action/Plan: CM following for d/c disposition.   Expected Discharge Date:                  Expected Discharge Plan:     In-House Referral:     Discharge planning Services     Post Acute Care Choice:    Choice offered to:     DME Arranged:    DME Agency:     HH Arranged:    HH Agency:     Status of Service:  In process, will continue to follow  If discussed at Long Length of Stay Meetings, dates discussed:    Additional Comments:  Kermit BaloKelli F Rexford Prevo, RN 04/26/2016, 11:58 AM

## 2016-04-26 NOTE — Progress Notes (Signed)
Provider notification made for Potassium level of 2.9.  New order received and initiated.

## 2016-04-26 NOTE — Progress Notes (Signed)
STROKE TEAM PROGRESS NOTE   HISTORY OF PRESENT ILLNESS (per record) Tammy KalataGracie C Huff is an 75 y.o. female with a history of diabetes mellitus, hypertension, arthritis, diverticulitis and dementia brought to the emergency room in code stroke status with slurred speech and facial asymmetry. No focal extremity weakness was noted. She has no history of stroke nor TIA. She's been taking aspirin daily. CT scan of her head showed an ill-defined apparent asymmetry with minimal effacement of left frontal sulci. This was thought to possibly be artifact. However, acute ischemic lesion cannot be ruled out. She was last seen well at 12:55 AM today 04/25/2016. Patient was noted to be confused at the time of this evaluation. No focal deficits were noted. NIH stroke score was 1. Code stroke was subsequently canceled. MRI is pending. Serum glucose was 411. Urinalysis is pending. Patient was afebrile.   SUBJECTIVE (INTERVAL HISTORY) Patient was sitting up at the side of the bed in her chair, sitter at bedside. Pleasantly confused. Otherwise, appropriately interactive.  OBJECTIVE Temp:  [97.8 F (36.6 C)-98.8 F (37.1 C)] 97.8 F (36.6 C) (06/29 0553) Pulse Rate:  [65-81] 65 (06/29 0553) Cardiac Rhythm:  [-] Normal sinus rhythm (06/28 1900) Resp:  [15-18] 16 (06/29 0553) BP: (123-179)/(65-95) 131/75 mmHg (06/29 0553) SpO2:  [93 %-99 %] 98 % (06/29 0553)  CBC:  Recent Labs Lab 04/25/16 0130 04/25/16 0136 04/26/16 0220  WBC 5.5  --  5.3  NEUTROABS 2.8  --   --   HGB 13.5 14.6 11.4*  HCT 38.2 43.0 33.1*  MCV 86.8  --  87.6  PLT PLATELETS APPEAR DECREASED  --  225    Basic Metabolic Panel:  Recent Labs Lab 04/25/16 0130 04/25/16 0136 04/25/16 0935 04/26/16 0220  NA 131* 135  --  139  K 3.3* 3.5  --  2.9*  CL 93* 95*  --  101  CO2 21*  --   --  30  GLUCOSE 406* 411*  --  297*  BUN 9 12  --  7  CREATININE 1.66* 1.40* 1.49* 1.64*  CALCIUM 8.7*  --   --  8.4*    Lipid Panel:    Component Value  Date/Time   CHOL 204* 04/26/2016 0220   TRIG 109 04/26/2016 0220   HDL 44 04/26/2016 0220   CHOLHDL 4.6 04/26/2016 0220   VLDL 22 04/26/2016 0220   LDLCALC 138* 04/26/2016 0220   HgbA1c:  Lab Results  Component Value Date   HGBA1C 13.3* 04/25/2016   Urine Drug Screen:    Component Value Date/Time   LABOPIA NONE DETECTED 08/12/2014 0135   COCAINSCRNUR NONE DETECTED 08/12/2014 0135   LABBENZ NONE DETECTED 08/12/2014 0135   AMPHETMU NONE DETECTED 08/12/2014 0135   THCU NONE DETECTED 08/12/2014 0135   LABBARB NONE DETECTED 08/12/2014 0135      IMAGING  Ct Angio Head W/cm &/or Wo Cm  04/25/2016  CLINICAL DATA:  Slurred speech.  Dementia. EXAM: CT ANGIOGRAPHY HEAD AND NECK TECHNIQUE: Multidetector CT imaging of the head and neck was performed using the standard protocol during bolus administration of intravenous contrast. Multiplanar CT image reconstructions and MIPs were obtained to evaluate the vascular anatomy. Carotid stenosis measurements (when applicable) are obtained utilizing NASCET criteria, using the distal internal carotid diameter as the denominator. CONTRAST:  50 cc Isovue 370 COMPARISON:  CT same day.  MRI 08/12/2014. FINDINGS: CTA NECK Aortic arch: Normal appearance. No atherosclerotic calcification. No dissection. No origin stenosis of the brachiocephalic vessels. Right carotid system:  Common carotid artery widely patent to the bifurcation. Minimal atherosclerotic plaque at the carotid bifurcation but no stenosis or irregularity. Cervical ICA is tortuous but widely patent. Left carotid system: Common carotid artery widely patent to the bifurcation. Carotid bifurcation is normal without visible plaque, stenosis or irregularity. Cervical ICA is tortuous but widely patent. Vertebral arteries:Both vertebral arteries approximately equal in size. Both vertebral artery origins widely patent. Both vertebral arteries widely patent through the cervical region, showing some tortuosity but  no evidence of stenosis or dissection. Skeleton: Ordinary mild spondylosis. Other neck: No lymphadenopathy. Mild thyroid goiter. Lung apices are clear. CTA HEAD Anterior circulation: Both internal carotid arteries widely patent through the carotid siphon regions. Ordinary peripheral siphon atherosclerotic calcification without stenosis. Supraclinoid internal carotid arteries show 30% stenoses bilaterally. Anterior and middle cerebral vessels are patent without proximal stenosis, aneurysm or vascular malformation. No missing distal branch vessels identified. Posterior circulation: Both vertebral arteries widely patent to the basilar. No basilar stenosis. Posterior circulation branch vessels appear patent and normal. Venous sinuses: Patent and normal. Anatomic variants: None significant Delayed phase: No abnormal enhancement IMPRESSION: No significant finding in the neck. No carotid bifurcation disease. No vertebral artery disease. Atherosclerotic calcification in the carotid siphon regions, typical of age. 30% stenoses of the supraclinoid internal carotid arteries bilaterally, but no flow-limiting stenosis. Intracranial branch vessels all appear normal. Electronically Signed   By: Paulina Fusi M.D.   On: 04/25/2016 11:23   Ct Angio Neck W/cm &/or Wo/cm  04/25/2016  CLINICAL DATA:  Slurred speech.  Dementia. EXAM: CT ANGIOGRAPHY HEAD AND NECK TECHNIQUE: Multidetector CT imaging of the head and neck was performed using the standard protocol during bolus administration of intravenous contrast. Multiplanar CT image reconstructions and MIPs were obtained to evaluate the vascular anatomy. Carotid stenosis measurements (when applicable) are obtained utilizing NASCET criteria, using the distal internal carotid diameter as the denominator. CONTRAST:  50 cc Isovue 370 COMPARISON:  CT same day.  MRI 08/12/2014. FINDINGS: CTA NECK Aortic arch: Normal appearance. No atherosclerotic calcification. No dissection. No origin  stenosis of the brachiocephalic vessels. Right carotid system: Common carotid artery widely patent to the bifurcation. Minimal atherosclerotic plaque at the carotid bifurcation but no stenosis or irregularity. Cervical ICA is tortuous but widely patent. Left carotid system: Common carotid artery widely patent to the bifurcation. Carotid bifurcation is normal without visible plaque, stenosis or irregularity. Cervical ICA is tortuous but widely patent. Vertebral arteries:Both vertebral arteries approximately equal in size. Both vertebral artery origins widely patent. Both vertebral arteries widely patent through the cervical region, showing some tortuosity but no evidence of stenosis or dissection. Skeleton: Ordinary mild spondylosis. Other neck: No lymphadenopathy. Mild thyroid goiter. Lung apices are clear. CTA HEAD Anterior circulation: Both internal carotid arteries widely patent through the carotid siphon regions. Ordinary peripheral siphon atherosclerotic calcification without stenosis. Supraclinoid internal carotid arteries show 30% stenoses bilaterally. Anterior and middle cerebral vessels are patent without proximal stenosis, aneurysm or vascular malformation. No missing distal branch vessels identified. Posterior circulation: Both vertebral arteries widely patent to the basilar. No basilar stenosis. Posterior circulation branch vessels appear patent and normal. Venous sinuses: Patent and normal. Anatomic variants: None significant Delayed phase: No abnormal enhancement IMPRESSION: No significant finding in the neck. No carotid bifurcation disease. No vertebral artery disease. Atherosclerotic calcification in the carotid siphon regions, typical of age. 30% stenoses of the supraclinoid internal carotid arteries bilaterally, but no flow-limiting stenosis. Intracranial branch vessels all appear normal. Electronically Signed   By: Loraine Leriche  Shogry M.D.   On: 04/25/2016 11:23   Ct Head Code Stroke W/o  Cm  04/25/2016  CLINICAL DATA:  75 year old female with right-sided weakness and confusion. Slurred speech. Code stroke. EXAM: CT HEAD WITHOUT CONTRAST TECHNIQUE: Contiguous axial images were obtained from the base of the skull through the vertex without intravenous contrast. COMPARISON:  Brain MRI dated 08/12/2014 and head CT dated 08/12/2014 FINDINGS: There is no acute intracranial hemorrhage. There is mild age-related atrophy and chronic microvascular ischemic changes. There is an ill-defined area of apparent asymmetry in the frontal lobes with apparent asymmetric prominence of the left frontal white matter and effacement of the adjacent sulci (best seen on series 201, image 22). Although this may be artifactual and related to patient positioning in the scanner, an acute ischemia is not excluded. Clinical correlation is recommended. There is no mass effect or midline shift. The visualized paranasal sinuses and mastoid air cells are clear. The calvarium is intact. IMPRESSION: No acute intracranial hemorrhage. Ill-defined apparent asymmetry with minimal effacement of the left frontal sulci. This may be artifactual and related to patient positioning. Acute ischemia is not excluded. Clinical correlation is recommended. MRI is recommended for further evaluation. These results were called by telephone at the time of interpretation on 04/25/2016 at 1:44 am to Dr. Roseanne RenoStewart, who verbally acknowledged these results. Electronically Signed   By: Elgie CollardArash  Radparvar M.D.   On: 04/25/2016 01:44   Carotid Doppler   There is 1-39% bilateral ICA stenosis. Vertebral artery flow is antegrade.    2-D echocardiogram - Left ventricle: The cavity size was normal. Wall thickness was increased in a pattern of moderate LVH. Systolic function was mildly reduced. The estimated ejection fraction was in the range of 45% to 50%. Diffuse hypokinesis. Doppler parameters are consistent with abnormal left ventricular relaxation (grade 1  diastolic dysfunction). The E/e&' ratio is between 8-15, suggesting indeterminate LV filling pressure. - Mitral valve: Mildly thickened leaflets . There was trivial regurgitation. - Left atrium: The atrium was normal in size. - Inferior vena cava: The vessel was normal in size. The respirophasic diameter changes were in the normal range (>= 50%), consistent with normal central venous pressure. Impressions:  Compared to a prior echo in 2015, there has been a global reduction in LV function with LVEF now at 45-50%.   PHYSICAL EXAM Elderly african american lady . Marland Kitchen. Afebrile. Head is nontraumatic. Neck is supple without bruit.    Cardiac exam no murmur or gallop. Lungs are clear to auscultation. Distal pulses are well felt. Neurological Exam ;  Awake alert oriented to person and place but not to time. Diminished attention, registration and recall. She will follow simple 1 step and occasional two-step commands only. No aphasia or apraxia dysarthria. Pupils irregular but reactive. Fundi were not visualized. Vision acuity and fields seem adequate. Face is symmetric without weakness. Tongue is midline. Motor system exam revealed no upper or lower extremity drift. Symmetric and equal strength in all 4 extremities but will remove the left side less than the right with some neglect. Reflexes symmetric. Plantars downgoing. Gait not tested. ASSESSMENT/PLAN Ms. Tammy KalataGracie C Chaney is a 75 y.o. female with history of diabetes mellitus, hypertension, arthritis, diverticulitis and dementia presenting with altered mental status with slurred speech. She did not receive IV t-PA.   Stroke vs TIA  MRI  pending  CTA head and neck  No acute hemorrhage. No significant carotid disease. Atherosclerosis without flow limiting stenosis. Intracranial branch vessels normal.  Carotid Doppler  No  significant stenosis   2D Echo  EF 45-50%. Global reduction in LV function since 2015. No source of embolus noted  LDL 138  HgbA1c  13.3  Heparin 5000 units sq tid for VTE prophylaxis  DIET - DYS 1 Room service appropriate?: Yes; Fluid consistency:: Thin  aspirin 325 mg daily prior to admission, now on aspirin 325 mg daily  Patient counseled to be compliant with her antithrombotic medications  Ongoing aggressive stroke risk factor management  Therapy recommendations:  pending   Disposition:  pending   Hypertension  Stable  Long-term BP goal normotensive  Hyperlipidemia  Home meds:  pravachol 80, resumed in hospital  LDL 138, goal < 70  Continue statin at discharge  Diabetes  HgbA1c 13.3, goal < 7.0  Uncontrolled  Treated with D50 last night for a low CBG  On D5.45% NS drip at 50h  Other Stroke Risk Factors  Advanced age  Other Active Problems  Baseline dementia  CKD Cr 1.4  GERD on pepcid  Hypokalemia  Hospital day #   I have personally examined this patient, reviewed notes, independently viewed imaging studies, participated in medical decision making and plan of care. I have made any additions or clarifications directly to the above note. Agree with note above. She presented with transient slurred speech and facial asymmetry possibly due to a TIA or small subcortical infarct. Patient has significant underlying dementia. Family is not available currently at the bedside. She remains at risk for recurrent strokes and TIAs. Recommend ongoing stroke evaluation. Greater than 50% of time during this 25 minute visit was spent on counseling and coordination of care about stroke evaluation and treatment Delia Heady, MD Medical Director [facility] Pager: [phone] 04/26/2016 3:13 PM     To contact Stroke Continuity provider, please refer to [email or web address]. After hours, contact General Neurology

## 2016-04-26 NOTE — Progress Notes (Signed)
Speech trying to work with pt. Pt now refusing to open eyes or follow commands.

## 2016-04-26 NOTE — Discharge Summary (Signed)
Physician Discharge Summary  Tammy KalataGracie C Blocher AOZ:[id number]:[id number] DOB: 02/12/1941 DOA: 04/25/2016  PCP: Massie MaroonHollis,Lachina M, FNP  Admit date: 04/25/2016 Discharge date: 04/27/16  Time spent:35 minutes  Recommendations for Outpatient Follow-up:  Patient will be discharged to home - discussed with daughter by phone, who states that patient is at her baseline and has plenty of family support to help take care of her.  Patient will need to follow up with primary care provider within one week of discharge.  Patient should continue medications as prescribed.  Patient should follow a heart healthy pureed diet.    Discharge Diagnoses:  Active Problems:   HTN (hypertension)   DM hyperosmolarity type II, uncontrolled (HCC)   Transient ischemia   CKD (chronic kidney disease)   Dementia with behavioral disturbance   GERD (gastroesophageal reflux disease)   Slurred speech   Diabetes mellitus with complication [facility](HCC)   Counseling regarding goals of care   Palliative care encounter   Discharge Condition: Stable  Diet recommendation: Heart healthy pureed diet  There were no vitals filed for this visit.  History of present illness:  HPI: Tammy KalataGracie C Alspaugh is a 75 y.o. female with medical history significant of dementia, diabetes, HTN, dementia, HTN, DM present from nursing home with complaint of slurred speech. Level V caveat applies as patient is unable to provide history. History provided by EDP report, accepting physician report, nursing home staff notes, and patient's daughter Jonetta SpeakLuis. Last known normal 12:50 AM. Just prior to arrival pt was noted by SNF staff to have difficulty speaking and slurred speech. EDP initially reported possible right facial droop. Code stroke was initiated and patient was evaluated immediately by neurology. Symptoms appear to be resolving or soon after evaluation and culture was canceled. Stroke workup initiated as outlined below. No history of previous stroke.  Hospital Course:    TIA: - Slurred Speech: resolving.  - Pt could not tolerate MRI in ED. CT w/o definitive acute process.  - Telemetry monitoring - Echo - global reduction in LVEF since 2015 with EF now 45-50%, no source of embolus noted - Carotid dopplers - negative for significant stenosis - CTA head/neck - negative (no acute hemorrhage, no significant carotid disease, atherosclerosis without flow-limiting stenosis, intracranial branch vessels normal) - SLP - Dysphagia 1 (puree) diet recommended - PT/OT - Neurology followed during hospitalization, does not need ongoing f/u - ASA, Statin  Dementia: appears at baseline. Intermittently combative. Occasionally aware of suroundings. Bad sundowning per family. - Haldol, ativan PRN as inpatient -Spoke with daughter today - she stated that patient seems to be back to her usual baseline, that they have more than adequate family to care for her at home, and that they think she is ready for discharge today -Palliative care recommended psych consult, but patient would not cooperate based on today's behavior; will consider tomorrow  HTN:  - resume home Coreg, Norvasc, HCTZ, Losartan, Clonidine - long-term BP goal is for patient to be normotensive  HLD: On Pravachol 80 mg at home, will continue  DM: Poor control at home based on A1c, appears to be compliance-related as patient has had hypoglycemia as inpatient  Social: discussed case w/ daughter [name].  - Palliative care consult; they recommended psychiatry consult but this was not deemed to be useful for this patient  CKD: Cr 1.4. Cr 1.46 this AM.   Hypokalemia: K 3.6 this morning.  Should be followed as patient may require outpatient repletion.  GERD: - continue pepcid  DM: - continue lantus -  SSI  Procedures:  Echo  Carotid dopplers  Consultations:  Neuro  Palliative care  Speech therapy  Discharge Exam: Filed Vitals:   04/27/16 0417 04/27/16 0759  BP: 144/59 153/77  Pulse: 55 53   Temp: 98.2 F (36.8 C) 97.5 F (36.4 C)  Resp: 16 15     General: Appears older than stated age, disheveled, continues to refuse to participate in care  HEENT: NCAT, mucous membranes moist.  Neck: Supple, no JVD, no masses  Cardiovascular: S1 S2 auscultated, no rubs, murmurs or gallops. Regular rate and rhythm.  Respiratory: Clear to auscultation bilaterally with equal chest rise  Abdomen: Soft, nontender, nondistended, + bowel sounds  Extremities: warm dry without cyanosis clubbing or edema  Neuro: unable to assess - poor participation  Skin: Without rashes exudates or nodules  Psych: Will not open eyes or participate  Discharge Instructions     Medication List    ASK your doctor about these medications        amLODipine 10 MG tablet  Commonly known as:  NORVASC  Take 10 mg by mouth every evening.     aspirin 325 MG EC tablet  Take 1 tablet (325 mg total) by mouth daily.     carvedilol 6.25 MG tablet  Commonly known as:  COREG  Take 1 tablet (6.25 mg total) by mouth 2 (two) times daily with a meal. Reported on 12/26/2015     cloNIDine 0.2 MG tablet  Commonly known as:  CATAPRES  Take 1 tablet (0.2 mg total) by mouth 2 (two) times daily.     diphenhydrAMINE 25 MG tablet  Commonly known as:  BENADRYL  Take 25 mg by mouth daily as needed for allergies.     famotidine 10 MG chewable tablet  Commonly known as:  PEPCID AC  Chew 10 mg by mouth every morning.     GERITOL PO  Take 1 tablet by mouth every morning.     insulin aspart 100 UNIT/ML FlexPen  Commonly known as:  NOVOLOG FLEXPEN  Inject 4 Units into the skin 3 (three) times daily with meals.     Insulin Glargine 100 UNIT/ML Solostar Pen  Commonly known as:  LANTUS SOLOSTAR  Inject 40 Units into the skin daily at 10 pm.     Insulin Pen Needle 31G X 8 MM Misc  Commonly known as:  RELION PEN NEEDLE 31G/8MM  Use as directed.     losartan-hydrochlorothiazide 100-25 MG tablet  Commonly known as:   HYZAAR  Take 1 tablet by mouth daily.     pravastatin 80 MG tablet  Commonly known as:  PRAVACHOL  Take 1 tablet (80 mg total) by mouth every morning.       No Known Allergies Follow-up Information    Follow up with Hollis,Lachina M, FNP In 1 week.   Specialty:  Family Medicine   Contact information:   [address] [place] [address] [phone]        The results of significant diagnostics from this hospitalization (including imaging, microbiology, ancillary and laboratory) are listed below for reference.    Significant Diagnostic Studies: Ct Angio Head W/cm &/or Wo Cm  04/25/2016  CLINICAL DATA:  Slurred speech.  Dementia. EXAM: CT ANGIOGRAPHY HEAD AND NECK TECHNIQUE: Multidetector CT imaging of the head and neck was performed using the standard protocol during bolus administration of intravenous contrast. Multiplanar CT image reconstructions and MIPs were obtained to evaluate the vascular anatomy. Carotid stenosis measurements (when applicable)  are obtained utilizing NASCET criteria, using the distal internal carotid diameter as the denominator. CONTRAST:  50 cc Isovue 370 COMPARISON:  CT same day.  MRI 08/12/2014. FINDINGS: CTA NECK Aortic arch: Normal appearance. No atherosclerotic calcification. No dissection. No origin stenosis of the brachiocephalic vessels. Right carotid system: Common carotid artery widely patent to the bifurcation. Minimal atherosclerotic plaque at the carotid bifurcation but no stenosis or irregularity. Cervical ICA is tortuous but widely patent. Left carotid system: Common carotid artery widely patent to the bifurcation. Carotid bifurcation is normal without visible plaque, stenosis or irregularity. Cervical ICA is tortuous but widely patent. Vertebral arteries:Both vertebral arteries approximately equal in size. Both vertebral artery origins widely patent. Both vertebral arteries widely patent through the cervical region, showing some tortuosity  but no evidence of stenosis or dissection. Skeleton: Ordinary mild spondylosis. Other neck: No lymphadenopathy. Mild thyroid goiter. Lung apices are clear. CTA HEAD Anterior circulation: Both internal carotid arteries widely patent through the carotid siphon regions. Ordinary peripheral siphon atherosclerotic calcification without stenosis. Supraclinoid internal carotid arteries show 30% stenoses bilaterally. Anterior and middle cerebral vessels are patent without proximal stenosis, aneurysm or vascular malformation. No missing distal branch vessels identified. Posterior circulation: Both vertebral arteries widely patent to the basilar. No basilar stenosis. Posterior circulation branch vessels appear patent and normal. Venous sinuses: Patent and normal. Anatomic variants: None significant Delayed phase: No abnormal enhancement IMPRESSION: No significant finding in the neck. No carotid bifurcation disease. No vertebral artery disease. Atherosclerotic calcification in the carotid siphon regions, typical of age. 30% stenoses of the supraclinoid internal carotid arteries bilaterally, but no flow-limiting stenosis. Intracranial branch vessels all appear normal. Electronically Signed   By: Paulina Fusi M.D.   On: 04/25/2016 11:23   Ct Angio Neck W/cm &/or Wo/cm  04/25/2016  CLINICAL DATA:  Slurred speech.  Dementia. EXAM: CT ANGIOGRAPHY HEAD AND NECK TECHNIQUE: Multidetector CT imaging of the head and neck was performed using the standard protocol during bolus administration of intravenous contrast. Multiplanar CT image reconstructions and MIPs were obtained to evaluate the vascular anatomy. Carotid stenosis measurements (when applicable) are obtained utilizing NASCET criteria, using the distal internal carotid diameter as the denominator. CONTRAST:  50 cc Isovue 370 COMPARISON:  CT same day.  MRI 08/12/2014. FINDINGS: CTA NECK Aortic arch: Normal appearance. No atherosclerotic calcification. No dissection. No origin  stenosis of the brachiocephalic vessels. Right carotid system: Common carotid artery widely patent to the bifurcation. Minimal atherosclerotic plaque at the carotid bifurcation but no stenosis or irregularity. Cervical ICA is tortuous but widely patent. Left carotid system: Common carotid artery widely patent to the bifurcation. Carotid bifurcation is normal without visible plaque, stenosis or irregularity. Cervical ICA is tortuous but widely patent. Vertebral arteries:Both vertebral arteries approximately equal in size. Both vertebral artery origins widely patent. Both vertebral arteries widely patent through the cervical region, showing some tortuosity but no evidence of stenosis or dissection. Skeleton: Ordinary mild spondylosis. Other neck: No lymphadenopathy. Mild thyroid goiter. Lung apices are clear. CTA HEAD Anterior circulation: Both internal carotid arteries widely patent through the carotid siphon regions. Ordinary peripheral siphon atherosclerotic calcification without stenosis. Supraclinoid internal carotid arteries show 30% stenoses bilaterally. Anterior and middle cerebral vessels are patent without proximal stenosis, aneurysm or vascular malformation. No missing distal branch vessels identified. Posterior circulation: Both vertebral arteries widely patent to the basilar. No basilar stenosis. Posterior circulation branch vessels appear patent and normal. Venous sinuses: Patent and normal. Anatomic variants: None significant Delayed phase: No abnormal enhancement IMPRESSION:  No significant finding in the neck. No carotid bifurcation disease. No vertebral artery disease. Atherosclerotic calcification in the carotid siphon regions, typical of age. 30% stenoses of the supraclinoid internal carotid arteries bilaterally, but no flow-limiting stenosis. Intracranial branch vessels all appear normal. Electronically Signed   By: Paulina FusiMark  Shogry M.D.   On: 04/25/2016 11:23   Ct Head Code Stroke W/o  Cm  04/25/2016  CLINICAL DATA:  75 year old female with right-sided weakness and confusion. Slurred speech. Code stroke. EXAM: CT HEAD WITHOUT CONTRAST TECHNIQUE: Contiguous axial images were obtained from the base of the skull through the vertex without intravenous contrast. COMPARISON:  Brain MRI dated 08/12/2014 and head CT dated 08/12/2014 FINDINGS: There is no acute intracranial hemorrhage. There is mild age-related atrophy and chronic microvascular ischemic changes. There is an ill-defined area of apparent asymmetry in the frontal lobes with apparent asymmetric prominence of the left frontal white matter and effacement of the adjacent sulci (best seen on series 201, image 22). Although this may be artifactual and related to patient positioning in the scanner, an acute ischemia is not excluded. Clinical correlation is recommended. There is no mass effect or midline shift. The visualized paranasal sinuses and mastoid air cells are clear. The calvarium is intact. IMPRESSION: No acute intracranial hemorrhage. Ill-defined apparent asymmetry with minimal effacement of the left frontal sulci. This may be artifactual and related to patient positioning. Acute ischemia is not excluded. Clinical correlation is recommended. MRI is recommended for further evaluation. These results were called by telephone at the time of interpretation on 04/25/2016 at 1:44 am to Dr. Roseanne RenoStewart, who verbally acknowledged these results. Electronically Signed   By: Elgie CollardArash  Radparvar M.D.   On: 04/25/2016 01:44    Microbiology: No results found for this or any previous visit (from the past 240 hour(s)).   Labs: Basic Metabolic Panel:  Recent Labs Lab 04/25/16 0130 04/25/16 0136 04/25/16 0935 04/26/16 0220 04/27/16 0621  NA 131* 135  --  139 136  K 3.3* 3.5  --  2.9* 3.6  CL 93* 95*  --  101 103  CO2 21*  --   --  30 25  GLUCOSE 406* 411*  --  297* 138*  BUN 9 12  --  7 9  CREATININE 1.66* 1.40* 1.49* 1.64* 1.46*  CALCIUM  8.7*  --   --  8.4* 8.6*  MG  --   --   --   --  1.7   Liver Function Tests:  Recent Labs Lab 04/25/16 0130  AST 17  ALT 11*  ALKPHOS 77  BILITOT 1.3*  PROT 7.0  ALBUMIN 3.2*   No results for input(s): LIPASE, AMYLASE in the last 168 hours. No results for input(s): AMMONIA in the last 168 hours. CBC:  Recent Labs Lab 04/25/16 0130 04/25/16 0136 04/26/16 0220  WBC 5.5  --  5.3  NEUTROABS 2.8  --   --   HGB 13.5 14.6 11.4*  HCT 38.2 43.0 33.1*  MCV 86.8  --  87.6  PLT PLATELETS APPEAR DECREASED  --  225   Cardiac Enzymes: No results for input(s): CKTOTAL, CKMB, CKMBINDEX, TROPONINI in the last 168 hours. BNP: BNP (last 3 results) No results for input(s): BNP in the last 8760 hours.  ProBNP (last 3 results) No results for input(s): PROBNP in the last 8760 hours.  CBG:  Recent Labs Lab 04/26/16 1638 04/26/16 2204 04/27/16 0618 04/27/16 1134 04/27/16 1234  GLUCAP 231* 298* 141* 62* 112*  Signed:  Jonah Blue  [facility] 04/27/2016, 1:48 PM

## 2016-04-26 NOTE — Progress Notes (Signed)
Pt ate all of her ice cream. Ate 1/4 of dinner plate.

## 2016-04-26 NOTE — Progress Notes (Signed)
Sitter reporting pt is not being nice and is being rude to the sitter. Once daughter showed up pt was more friendly and cooperative

## 2016-04-26 NOTE — Progress Notes (Signed)
SLP Cancellation Note  Patient Details Name: Tammy Huff MRN: [id number] DOB: 04/20/1941   Cancelled treatment:       Reason Eval/Treat Not Completed: Fatigue/lethargy limiting ability to participate. Pt minimally responsive, turning head away from bolus presentations. RN reported that pt did well consuming dinner last night, only consumed a few bites of breakfast this a.m. Will continue attempts.   [place] Kungle[place]ak, Amy K, MA, CCC-SLP 04/26/2016, 9:21 AM [phone]

## 2016-04-26 NOTE — Care Management Obs Status (Signed)
MEDICARE OBSERVATION STATUS NOTIFICATION   Patient Details  Name: Tammy Huff MRN: [id number] Date of Birth: 04/05/1941   Medicare Observation Status Notification Given:  Yes (CTA negative)    Kermit BaloKelli F Audreanna Torrisi, RN 04/26/2016, 10:29 AM

## 2016-04-26 NOTE — Progress Notes (Signed)
At this time pt alert and oriented to person and place. Following commands.

## 2016-04-26 NOTE — Progress Notes (Addendum)
Nurse intern went into room because bed alarm was going off. Pt pulled out her IV prior to nurse intern entering. Intern helped pt to the bathroom. Then pt took her gown off. Will page md. md okay with rn not starting another IV. This rn concerned pt would try to hit rn during IV insertion.

## 2016-04-26 NOTE — Progress Notes (Signed)
rn paged md around [id number], family was requesting to see md. md had already rounded this morning. md called back and asked rn to tell daughter that md would be happy to come by and speak with daugther, but that it would be later this afternoon.    rn told this to daughter. Daughter said she had to leave now

## 2016-04-26 NOTE — Consult Note (Signed)
Consultation Note Date: 04/26/2016   Patient Name: Tammy Huff  DOB: 07/03/1941  MRN: [id number]  Age / Sex: 75 y.o., female  PCP: Tammy MaroonLachina M Hollis, FNP Referring Physician: Dorothea OgleIskra M Myers, MD  Reason for Consultation: Establishing goals of care  HPI/Patient Profile: 75 y.o. female  with past medical history of dementia, diabetes, HTN, admitted on 04/25/2016 with suspected stroke or TIA. She presented to the ED on 6/28 by EMS with complaint of slurred speech and possible right facial droop. Symptoms completely resolved and Code Stroke was cancelled. MRI was inconclusive due to artifact from patient being unable to lie still. She has been residing at home with her daughter, Tammy Huff for the past month. [place]. This admission she has been intermittently awake and alert, completely obtunded, and combative at times.  Clinical Assessment and Goals of Care: Serveral attempts were made to reach Tammy Huff's family members but were unsuccessful. Evaluation of patient was attempted twice- one time the patient remained in bed with covers pulled tight over her head and she would not interact. The second time she aroused to touch and light, but would then clamp her eyes shut. When asked her where she lived, [place] all other questions were answered with [place] in [place] Her sitter stated that she does know her birthday and has been able to answer that question this morning. Her affect was not consistent with the usual presentation of dementia or delirium.  Additionally, she has had interspersed moments of complete clarity and alertness, eating on her own, etc and for these reasons I would recommend a psych consult. Otherwise, no planning or Goals of Care could be obtained.  NEXT OF KIN- Daughter- Tammy Huff    SUMMARY OF RECOMMENDATIONS     Code Status/Advance Care  Planning:  Full code- unable to discuss Code status with patient or family    Symptom Management:   Recommend psych consult d/t patients presentation not consistent with dementia or delirium  Palliative Prophylaxis:   Delirium Protocol    Prognosis:   Unable to determine - d/t patient's mental status and possible psych issues  Discharge Planning: To Be Determined      Primary Diagnoses: Present on Admission:  . Transient ischemia . DM hyperosmolarity type II, uncontrolled (HCC) . HTN (hypertension)  I have reviewed the medical record, interviewed the patient and family, and examined the patient. The following aspects are pertinent.  Past Medical History  Diagnosis Date  . Diabetes mellitus without complication (HCC)   . Hypertension   . Arthritis   . Diverticulitis   . Dementia     advanced   Social History   Social History  . Marital Status: Widowed    Spouse Name: N/A  . Number of Children: N/A  . Years of Education: N/A   Social History Main Topics  . Smoking status: Never Smoker   . Smokeless tobacco: None  . Alcohol Use: No  . Drug Use: None  . Sexual Activity: Not Asked  Other Topics Concern  . None   Social History Narrative   Family History  Problem Relation Age of Onset  . Family history unknown: Yes   Scheduled Meds: . aspirin EC  325 mg Oral Daily  . cloNIDine  0.2 mg Oral BID  . famotidine  10 mg Oral q morning - 10a  . heparin  5,000 Units Subcutaneous Q8H  . insulin aspart  0-9 Units Subcutaneous TID WC  . insulin aspart  5 Units Subcutaneous Once  . insulin glargine  40 Units Subcutaneous Q2200  . potassium chloride  40 mEq Oral Q6H  . pravastatin  80 mg Oral Daily  . sodium chloride flush  3 mL Intravenous Q12H   Continuous Infusions: . dextrose 5 % and 0.45% NaCl 50 mL/hr at 04/25/16 1711   PRN Meds:.acetaminophen **OR** acetaminophen, haloperidol lactate, hydrALAZINE, LORazepam, ondansetron **OR** ondansetron (ZOFRAN)  IV Medications Prior to Admission:  Prior to Admission medications   Medication Sig Start Date End Date Taking? Authorizing Provider  aspirin EC 325 MG EC tablet Take 1 tablet (325 mg total) by mouth daily. 08/12/14  Yes Richarda Overlie, MD  carvedilol (COREG) 6.25 MG tablet Take 1 tablet (6.25 mg total) by mouth 2 (two) times daily with a meal. Reported on 12/26/2015 03/14/16  Yes Dione Booze, MD  cloNIDine (CATAPRES) 0.2 MG tablet Take 1 tablet (0.2 mg total) by mouth 2 (two) times daily. 03/14/16  Yes Dione Booze, MD  insulin aspart (NOVOLOG FLEXPEN) 100 UNIT/ML FlexPen Inject 4 Units into the skin 3 (three) times daily with meals. 03/14/16  Yes Dione Booze, MD  Insulin Glargine (LANTUS SOLOSTAR) 100 UNIT/ML Solostar Pen Inject 40 Units into the skin daily at 10 pm. 03/14/16  Yes Dione Booze, MD  losartan-hydrochlorothiazide (HYZAAR) 100-25 MG tablet Take 1 tablet by mouth daily. 03/14/16  Yes Dione Booze, MD  pravastatin (PRAVACHOL) 80 MG tablet Take 1 tablet (80 mg total) by mouth every morning. 03/14/16  Yes Dione Booze, MD  amLODipine (NORVASC) 10 MG tablet Take 10 mg by mouth every evening.    Historical Provider, MD  diphenhydrAMINE (BENADRYL) 25 MG tablet Take 25 mg by mouth daily as needed for allergies.     Historical Provider, MD  famotidine (PEPCID AC) 10 MG chewable tablet Chew 10 mg by mouth every morning.     Historical Provider, MD  Insulin Pen Needle (RELION PEN NEEDLE 31G/8MM) 31G X 8 MM MISC Use as directed. 03/14/16   Dione Booze, MD  Iron-Vitamins (GERITOL PO) Take 1 tablet by mouth every morning.    Historical Provider, MD   No Known Allergies Review of Systems  Unable to perform ROS: Other    Physical Exam  Constitutional: She appears well-developed and well-nourished. No distress.  Pulmonary/Chest: Effort normal.  Skin: Skin is warm and dry.  Psychiatric:  Bizarre behavior     Vital Signs: BP 131/75 mmHg  Pulse 65  Temp(Src) 97.8 F (36.6 C) (Oral)  Resp 16  SpO2  98% Pain Assessment: No/denies pain   Pain Score: 0-No pain   SpO2: SpO2: 98 % O2 Device:SpO2: 98 % O2 Flow Rate: .   IO: Intake/output summary:  Intake/Output Summary (Last 24 hours) at 04/26/16 0947 Last data filed at 04/26/16 0630  Gross per 24 hour  Intake 905.84 ml  Output      0 ml  Net 905.84 ml    LBM: Last BM Date: 04/25/16 Baseline Weight:   Most recent weight:  Palliative Assessment/Data:     Time In: 0930 Time Out: 1000 Time Total: 30 minutes  Thank you for this consult. Please contact PCM if we can be of further assistance.  Greater than 50%  of this time was spent counseling and coordinating care related to the above assessment and plan.  Signed by: Ocie BobKasie Bill Mcvey, AGNP-C   Please contact Palliative Medicine Team phone at [phone] for questions and concerns.  For individual provider: See Loretha StaplerAmion

## 2016-04-26 NOTE — Progress Notes (Signed)
PROGRESS NOTE    Tammy Huff  ZOX:[id number] DOB: 20-Feb-1941 DOA: 04/25/2016 PCP: Massie Maroon, FNP  Outpatient Specialists:     Brief Narrative:   HPI: Tammy Huff is a 75 y.o. female with medical history significant of dementia, diabetes, HTN, dementia, HTN, DM present from nursing home with complaint of slurred speech. Level V caveat applies as patient is unable to provide history. History provided by EDP report, accepting physician report, nursing home staff notes, and patient's daughter Jonetta Speak. Last known normal 12:50 AM. Just prior to arrival pt was noted by SNF staff to have difficulty speaking and slurred speech. EDP initially reported possible right facial droop. Code stroke was initiated and patient was evaluated immediately by neurology. Symptoms appear to be resolving or soon after evaluation and culture was canceled. Stroke workup initiated as outlined below. No history of previous stroke.   Assessment & Plan:   Active Problems:   HTN (hypertension)   DM hyperosmolarity type II, uncontrolled (HCC)   Transient ischemia   CKD (chronic kidney disease)   Dementia with behavioral disturbance   GERD (gastroesophageal reflux disease)   Slurred speech   Diabetes mellitus with complication [facility])   Counseling regarding goals of care   Palliative care encounter   Slurred Speech: resolving. TIA workup initiated. Pt could not tolerate MRI in ED. CT w/o definitive acute process.  - Tele - Echo - global reduction in LVEF since 2015 with EF now 45-50% - Carotid dopplers - negative - CTA head/neck - negative - SLP - Dysphagia 1 (puree) diet recommended - PT/OT - Neuro following - ASA, Statin - UA  Dementia: appears at baseline. Intermittently combative. Occasionally aware of suroundings. Bad sundowning per family. - Haldol, ativan PRN -Unable to contact family (daughter present for short period of time but did not wait until I was able to get there to speak with her  and unable to leave message; I did leave a message for the patient's son) -Palliative care recommended psych consult, but patient would not cooperate based on today's behavior; will consider tomorrow  HTN: Allow permissive HTN, will resume home meds tomorrow - hold home Cored, Norvasc, HCTZ, Losartan - Continue clonidine to prevent rebound - Hydralazine PRN  HLD: On Pravachol 80 mg at home, will continue  DM: Poor control at home based on A1c, appears to be compliance-related as patient has had hypoglycemia as inpatient  Social: discussed case w/ daughter Sallye Ober.  - Palliative care consult for GOC and POA  CKD: Cr 1.4.  Cr 1.64 this AM. - BMP in AM  Hypokalemia: K 2.9 this AM, repleted, check again in AM and check mag level  GERD: - continue pepcid  DM: - continue lantus - SSI   DVT prophylaxis:SCD  Code Status: FULL  Family Communication: daughter  Disposition Plan: pending workup  Consults called: Neuro  Admission status: Observation  Consultants:   Palliative care  Speech therapy   Procedures:  Echo, carotid dopplers  Antimicrobials:   None   Subjective: Patient refused to open her eyes or interact other than when directly stimulated.  No verbal responses to questions.  Objective: Filed Vitals:   04/26/16 0129 04/26/16 0553 04/26/16 0946 04/26/16 1506  BP: 123/65 131/75 136/76 115/77  Pulse: 74 65 73 73  Temp: 98.8 F (37.1 C) 97.8 F (36.6 C) 98 F (36.7 C) 99.1 F (37.3 C)  TempSrc: Oral Oral Axillary Oral  Resp: SpO2: 99% 98% 100% 98%  Intake/Output Summary (Last 24 hours) at 04/26/16 1725 Last data filed at 04/26/16 1300  Gross per 24 hour  Intake 1005.84 ml  Output      0 ml  Net 1005.84 ml   There were no vitals filed for this visit.  Examination:  General exam: Appears calm and comfortable  Respiratory system: Clear to auscultation. Respiratory effort normal. Cardiovascular system: S1 & S2 heard, RRR. No  JVD, murmurs, rubs, gallops or clicks. No pedal edema. Gastrointestinal system: Abdomen is nondistended, soft and nontender. No organomegaly or masses felt. Normal bowel sounds heard. Central nervous system: Alert and oriented. No focal neurological deficits. Extremities: Symmetric 5 x 5 power. Skin: No rashes, lesions or ulcers Psychiatry: Unable to assess due to unwillingness to participate    Data Reviewed: I have personally reviewed following labs and imaging studies  CBC:  Recent Labs Lab 04/25/16 0130 04/25/16 0136 04/26/16 0220  WBC 5.5  --  5.3  NEUTROABS 2.8  --   --   HGB 13.5 14.6 11.4*  HCT 38.2 43.0 33.1*  MCV 86.8  --  87.6  PLT PLATELETS APPEAR DECREASED  --  225   Basic Metabolic Panel:  Recent Labs Lab 04/25/16 0130 04/25/16 0136 04/25/16 0935 04/26/16 0220  NA 131* 135  --  139  K 3.3* 3.5  --  2.9*  CL 93* 95*  --  101  CO2 21*  --   --  30  GLUCOSE 406* 411*  --  297*  BUN 9 12  --  7  CREATININE 1.66* 1.40* 1.49* 1.64*  CALCIUM 8.7*  --   --  8.4*   GFR: CrCl cannot be calculated (Unknown ideal weight.). Liver Function Tests:  Recent Labs Lab 04/25/16 0130  AST 17  ALT 11*  ALKPHOS 77  BILITOT 1.3*  PROT 7.0  ALBUMIN 3.2*   No results for input(s): LIPASE, AMYLASE in the last 168 hours. No results for input(s): AMMONIA in the last 168 hours. Coagulation Profile:  Recent Labs Lab 04/25/16 0130  INR 1.00   Cardiac Enzymes: No results for input(s): CKTOTAL, CKMB, CKMBINDEX, TROPONINI in the last 168 hours. BNP (last 3 results) No results for input(s): PROBNP in the last 8760 hours. HbA1C:  Recent Labs  04/25/16 0934  HGBA1C 13.3*   CBG:  Recent Labs Lab 04/25/16 1810 04/25/16 2056 04/26/16 0629 04/26/16 1129 04/26/16 1638  GLUCAP 248* 379* 168* 112* 231*   Lipid Profile:  Recent Labs  04/26/16 0220  CHOL 204*  HDL 44  LDLCALC 138*  TRIG 109  CHOLHDL 4.6   Thyroid Function Tests: No results for  input(s): TSH, T4TOTAL, FREET4, T3FREE, THYROIDAB in the last 72 hours. Anemia Panel: No results for input(s): VITAMINB12, FOLATE, FERRITIN, TIBC, IRON, RETICCTPCT in the last 72 hours. Urine analysis:    Component Value Date/Time   COLORURINE YELLOW 04/25/2016 1641   APPEARANCEUR CLEAR 04/25/2016 1641   LABSPEC 1.021 04/25/2016 1641   PHURINE 6.5 04/25/2016 1641   GLUCOSEU >1000* 04/25/2016 1641   HGBUR TRACE* 04/25/2016 1641   BILIRUBINUR NEGATIVE 04/25/2016 1641   KETONESUR 15* 04/25/2016 1641   PROTEINUR 100* 04/25/2016 1641   UROBILINOGEN 0.2 12/26/2015 1530   NITRITE NEGATIVE 04/25/2016 1641   LEUKOCYTESUR NEGATIVE 04/25/2016 1641   Sepsis Labs: @LABRCNTIP (procalcitonin:4,lacticidven:4)  )No results found for this or any previous visit (from the past 240 hour(s)).       Radiology Studies: Ct Angio Head W/cm &/or Wo Cm  04/25/2016  CLINICAL DATA:  Slurred speech.  Dementia. EXAM: CT ANGIOGRAPHY HEAD AND NECK TECHNIQUE: Multidetector CT imaging of the head and neck was performed using the standard protocol during bolus administration of intravenous contrast. Multiplanar CT image reconstructions and MIPs were obtained to evaluate the vascular anatomy. Carotid stenosis measurements (when applicable) are obtained utilizing NASCET criteria, using the distal internal carotid diameter as the denominator. CONTRAST:  50 cc Isovue 370 COMPARISON:  CT same day.  MRI 08/12/2014. FINDINGS: CTA NECK Aortic arch: Normal appearance. No atherosclerotic calcification. No dissection. No origin stenosis of the brachiocephalic vessels. Right carotid system: Common carotid artery widely patent to the bifurcation. Minimal atherosclerotic plaque at the carotid bifurcation but no stenosis or irregularity. Cervical ICA is tortuous but widely patent. Left carotid system: Common carotid artery widely patent to the bifurcation. Carotid bifurcation is normal without visible plaque, stenosis or irregularity.  Cervical ICA is tortuous but widely patent. Vertebral arteries:Both vertebral arteries approximately equal in size. Both vertebral artery origins widely patent. Both vertebral arteries widely patent through the cervical region, showing some tortuosity but no evidence of stenosis or dissection. Skeleton: Ordinary mild spondylosis. Other neck: No lymphadenopathy. Mild thyroid goiter. Lung apices are clear. CTA HEAD Anterior circulation: Both internal carotid arteries widely patent through the carotid siphon regions. Ordinary peripheral siphon atherosclerotic calcification without stenosis. Supraclinoid internal carotid arteries show 30% stenoses bilaterally. Anterior and middle cerebral vessels are patent without proximal stenosis, aneurysm or vascular malformation. No missing distal branch vessels identified. Posterior circulation: Both vertebral arteries widely patent to the basilar. No basilar stenosis. Posterior circulation branch vessels appear patent and normal. Venous sinuses: Patent and normal. Anatomic variants: None significant Delayed phase: No abnormal enhancement IMPRESSION: No significant finding in the neck. No carotid bifurcation disease. No vertebral artery disease. Atherosclerotic calcification in the carotid siphon regions, typical of age. 30% stenoses of the supraclinoid internal carotid arteries bilaterally, but no flow-limiting stenosis. Intracranial branch vessels all appear normal. Electronically Signed   By: Paulina FusiMark  Shogry M.D.   On: 04/25/2016 11:23   Ct Angio Neck W/cm &/or Wo/cm  04/25/2016  CLINICAL DATA:  Slurred speech.  Dementia. EXAM: CT ANGIOGRAPHY HEAD AND NECK TECHNIQUE: Multidetector CT imaging of the head and neck was performed using the standard protocol during bolus administration of intravenous contrast. Multiplanar CT image reconstructions and MIPs were obtained to evaluate the vascular anatomy. Carotid stenosis measurements (when applicable) are obtained utilizing NASCET  criteria, using the distal internal carotid diameter as the denominator. CONTRAST:  50 cc Isovue 370 COMPARISON:  CT same day.  MRI 08/12/2014. FINDINGS: CTA NECK Aortic arch: Normal appearance. No atherosclerotic calcification. No dissection. No origin stenosis of the brachiocephalic vessels. Right carotid system: Common carotid artery widely patent to the bifurcation. Minimal atherosclerotic plaque at the carotid bifurcation but no stenosis or irregularity. Cervical ICA is tortuous but widely patent. Left carotid system: Common carotid artery widely patent to the bifurcation. Carotid bifurcation is normal without visible plaque, stenosis or irregularity. Cervical ICA is tortuous but widely patent. Vertebral arteries:Both vertebral arteries approximately equal in size. Both vertebral artery origins widely patent. Both vertebral arteries widely patent through the cervical region, showing some tortuosity but no evidence of stenosis or dissection. Skeleton: Ordinary mild spondylosis. Other neck: No lymphadenopathy. Mild thyroid goiter. Lung apices are clear. CTA HEAD Anterior circulation: Both internal carotid arteries widely patent through the carotid siphon regions. Ordinary peripheral siphon atherosclerotic calcification without stenosis. Supraclinoid internal carotid arteries show 30% stenoses bilaterally. Anterior and middle cerebral vessels are patent  without proximal stenosis, aneurysm or vascular malformation. No missing distal branch vessels identified. Posterior circulation: Both vertebral arteries widely patent to the basilar. No basilar stenosis. Posterior circulation branch vessels appear patent and normal. Venous sinuses: Patent and normal. Anatomic variants: None significant Delayed phase: No abnormal enhancement IMPRESSION: No significant finding in the neck. No carotid bifurcation disease. No vertebral artery disease. Atherosclerotic calcification in the carotid siphon regions, typical of age. 30%  stenoses of the supraclinoid internal carotid arteries bilaterally, but no flow-limiting stenosis. Intracranial branch vessels all appear normal. Electronically Signed   By: Paulina Fusi M.D.   On: 04/25/2016 11:23   Ct Head Code Stroke W/o Cm  04/25/2016  CLINICAL DATA:  75 year old female with right-sided weakness and confusion. Slurred speech. Code stroke. EXAM: CT HEAD WITHOUT CONTRAST TECHNIQUE: Contiguous axial images were obtained from the base of the skull through the vertex without intravenous contrast. COMPARISON:  Brain MRI dated 08/12/2014 and head CT dated 08/12/2014 FINDINGS: There is no acute intracranial hemorrhage. There is mild age-related atrophy and chronic microvascular ischemic changes. There is an ill-defined area of apparent asymmetry in the frontal lobes with apparent asymmetric prominence of the left frontal white matter and effacement of the adjacent sulci (best seen on series 201, image 22). Although this may be artifactual and related to patient positioning in the scanner, an acute ischemia is not excluded. Clinical correlation is recommended. There is no mass effect or midline shift. The visualized paranasal sinuses and mastoid air cells are clear. The calvarium is intact. IMPRESSION: No acute intracranial hemorrhage. Ill-defined apparent asymmetry with minimal effacement of the left frontal sulci. This may be artifactual and related to patient positioning. Acute ischemia is not excluded. Clinical correlation is recommended. MRI is recommended for further evaluation. These results were called by telephone at the time of interpretation on 04/25/2016 at 1:44 am to Dr. Roseanne Reno, who verbally acknowledged these results. Electronically Signed   By: Elgie Collard M.D.   On: 04/25/2016 01:44        Scheduled Meds: . aspirin EC  325 mg Oral Daily  . cloNIDine  0.2 mg Oral BID  . famotidine  10 mg Oral q morning - 10a  . heparin  5,000 Units Subcutaneous Q8H  . insulin aspart   0-9 Units Subcutaneous TID WC  . insulin aspart  5 Units Subcutaneous Once  . insulin glargine  40 Units Subcutaneous Q2200  . pravastatin  80 mg Oral Daily  . sodium chloride flush  3 mL Intravenous Q12H   Continuous Infusions: . dextrose 5 % and 0.45% NaCl 50 mL/hr at 04/26/16 1256        Time spent: 25 min    Jonah Blue, MD [facility]   If 7PM-7AM, please contact night-coverage [email or web address] Password [email or web address] 04/26/2016, 5:25 PM

## 2016-04-27 DIAGNOSIS — I129 Hypertensive chronic kidney disease with stage 1 through stage 4 chronic kidney disease, or unspecified chronic kidney disease: Secondary | ICD-10-CM | POA: Diagnosis not present

## 2016-04-27 DIAGNOSIS — G459 Transient cerebral ischemic attack, unspecified: Secondary | ICD-10-CM | POA: Diagnosis not present

## 2016-04-27 LAB — GLUCOSE, CAPILLARY
GLUCOSE-CAPILLARY: 62 mg/dL — AB (ref 65–99)
Glucose-Capillary: 298 mg/dL — ABNORMAL HIGH (ref 65–99)
Glucose-Capillary: 141 mg/dL — ABNORMAL HIGH (ref 65–99)
Glucose-Capillary: 112 mg/dL — ABNORMAL HIGH (ref 65–99)

## 2016-04-27 LAB — BASIC METABOLIC PANEL
Anion gap: 8 (ref 5–15)
BUN: 9 mg/dL (ref 6–20)
CHLORIDE: 103 mmol/L (ref 101–111)
CO2: 25 mmol/L (ref 22–32)
CREATININE: 1.46 mg/dL — AB (ref 0.44–1.00)
Calcium: 8.6 mg/dL — ABNORMAL LOW (ref 8.9–10.3)
GFR calc Af Amer: 39 mL/min — ABNORMAL LOW (ref >60)
GFR calc non Af Amer: 34 mL/min — ABNORMAL LOW (ref >60)
GLUCOSE: 138 mg/dL — AB (ref 65–99)
Potassium: 3.6 mmol/L (ref 3.5–5.1)
SODIUM: 136 mmol/L (ref 135–145)

## 2016-04-27 LAB — MAGNESIUM: MAGNESIUM: 1.7 mg/dL (ref 1.7–2.4)

## 2016-04-27 MED ORDER — POTASSIUM CHLORIDE 10 MEQ/100ML IV SOLN
INTRAVENOUS | Status: AC
  Filled 2016-04-27: qty 100

## 2016-04-27 NOTE — Progress Notes (Signed)
STROKE TEAM PROGRESS NOTE   HISTORY OF PRESENT ILLNESS (per record) Tammy Huff is an 75 y.o. female with a history of diabetes mellitus, hypertension, arthritis, diverticulitis and dementia brought to the emergency room in code stroke status with slurred speech and facial asymmetry. No focal extremity weakness was noted. She has no history of stroke nor TIA. She's been taking aspirin daily. CT scan of her head showed an ill-defined apparent asymmetry with minimal effacement of left frontal sulci. This was thought to possibly be artifact. However, acute ischemic lesion cannot be ruled out. She was last seen well at 12:55 AM today 04/25/2016. Patient was noted to be confused at the time of this evaluation. No focal deficits were noted. NIH stroke score was 1. Code stroke was subsequently canceled. MRI is pending. Serum glucose was 411. Urinalysis is pending. Patient was afebrile.   SUBJECTIVE (INTERVAL HISTORY) Patient was sitting up at the side of the bed in her chair, sitter at bedside. Pleasantly confused. Otherwise, appropriately interactive.unable to cooperate for doing MRI  OBJECTIVE Temp:  [97.5 F (36.4 C)-98.7 F (37.1 C)] 97.5 F (36.4 C) (06/30 1353) Pulse Rate:  [53-77] 58 (06/30 1353) Cardiac Rhythm:  [-] Sinus bradycardia (06/30 0700) Resp:  [15-16] 16 (06/30 1353) BP: (115-153)/(52-89) 115/52 mmHg (06/30 1353) SpO2:  [96 %-99 %] 97 % (06/30 1353)  CBC:   Recent Labs Lab 04/25/16 0130 04/25/16 0136 04/26/16 0220  WBC 5.5  --  5.3  NEUTROABS 2.8  --   --   HGB 13.5 14.6 11.4*  HCT 38.2 43.0 33.1*  MCV 86.8  --  87.6  PLT PLATELETS APPEAR DECREASED  --  225    Basic Metabolic Panel:   Recent Labs Lab 04/26/16 0220 04/27/16 0621  NA 139 136  K 2.9* 3.6  CL 101 103  CO2 30 25  GLUCOSE 297* 138*  BUN 7 9  CREATININE 1.64* 1.46*  CALCIUM 8.4* 8.6*  MG  --  1.7    Lipid Panel:     Component Value Date/Time   CHOL 204* 04/26/2016 0220   TRIG 109  04/26/2016 0220   HDL 44 04/26/2016 0220   CHOLHDL 4.6 04/26/2016 0220   VLDL 22 04/26/2016 0220   LDLCALC 138* 04/26/2016 0220   HgbA1c:  Lab Results  Component Value Date   HGBA1C 13.3* 04/25/2016   Urine Drug Screen:     Component Value Date/Time   LABOPIA NONE DETECTED 08/12/2014 0135   COCAINSCRNUR NONE DETECTED 08/12/2014 0135   LABBENZ NONE DETECTED 08/12/2014 0135   AMPHETMU NONE DETECTED 08/12/2014 0135   THCU NONE DETECTED 08/12/2014 0135   LABBARB NONE DETECTED 08/12/2014 0135      IMAGING  No results found. Carotid Doppler   There is 1-39% bilateral ICA stenosis. Vertebral artery flow is antegrade.    2-D echocardiogram - Left ventricle: The cavity size was normal. Wall thickness was increased in a pattern of moderate LVH. Systolic function was mildly reduced. The estimated ejection fraction was in the range of 45% to 50%. Diffuse hypokinesis. Doppler parameters are consistent with abnormal left ventricular relaxation (grade 1 diastolic dysfunction). The E/e&' ratio is between 8-15, suggesting indeterminate LV filling pressure. - Mitral valve: Mildly thickened leaflets . There was trivial regurgitation. - Left atrium: The atrium was normal in size. - Inferior vena cava: The vessel was normal in size. The respirophasic diameter changes were in the normal range (>= 50%), consistent with normal central venous pressure. Impressions:  Compared to a  prior echo in 2015, there has been a global reduction in LV function with LVEF now at 45-50%.   PHYSICAL EXAM Elderly african american lady . Marland Kitchen. Afebrile. Head is nontraumatic. Neck is supple without bruit.    Cardiac exam no murmur or gallop. Lungs are clear to auscultation. Distal pulses are well felt. Neurological Exam ;  Awake alert oriented to person and place but not to time. Diminished attention, registration and recall. She will follow simple 1 step and occasional two-step commands only. No aphasia or apraxia  dysarthria. Pupils irregular but reactive. Fundi were not visualized. Vision acuity and fields seem adequate. Face is symmetric without weakness. Tongue is midline. Motor system exam revealed no upper or lower extremity drift. Symmetric and equal strength in all 4 extremities but will remove the left side less than the right with some neglect. Reflexes symmetric. Plantars downgoing. Gait not tested. ASSESSMENT/PLAN Tammy Huff is a 75 y.o. female with history of diabetes mellitus, hypertension, arthritis, diverticulitis and dementia presenting with altered mental status with slurred speech. She did not receive IV t-PA.   Stroke vs TIA  MRI  pending  CTA head and neck  No acute hemorrhage. No significant carotid disease. Atherosclerosis without flow limiting stenosis. Intracranial branch vessels normal.  Carotid Doppler  No significant stenosis   2D Echo  EF 45-50%. Global reduction in LV function since 2015. No source of embolus noted  LDL 138  HgbA1c 13.3  Heparin 5000 units sq tid for VTE prophylaxis DIET - DYS 1 Room service appropriate?: Yes; Fluid consistency:: Thin Diet - low sodium heart healthy  aspirin 325 mg daily prior to admission, now on aspirin 325 mg daily  Patient counseled to be compliant with her antithrombotic medications  Ongoing aggressive stroke risk factor management  Therapy recommendations:  pending   Disposition:  pending   Hypertension  Stable  Long-term BP goal normotensive  Hyperlipidemia  Home meds:  pravachol 80, resumed in hospital  LDL 138, goal < 70  Continue statin at discharge  Diabetes  HgbA1c 13.3, goal < 7.0  Uncontrolled  Treated with D50 last night for a low CBG  On D5.45% NS drip at 50h  Other Stroke Risk Factors  Advanced age  Other Active Problems  Baseline dementia  CKD Cr 1.4  GERD on pepcid  Hypokalemia  Hospital day #   I have personally examined this patient, reviewed notes, independently  viewed imaging studies, participated in medical decision making and plan of care. I have made any additions or clarifications directly to the above note. Agree with note above. She presented with transient slurred speech and facial asymmetry possibly due to a TIA or small subcortical infarct. Patient has significant underlying dementia. Family is not available currently at the bedside.  I feel MRI is not necessary as findings unlikley to change management due to severe underlying dementia.D/W DrYates. Greater than 50% of time during this 15 minute visit was spent on counseling and coordination of care about stroke evaluation and treatment Delia HeadyPramod Sethi, MD Medical Director [facility] Pager: [phone] 04/27/2016 3:06 PM     To contact Stroke Continuity provider, please refer to [email or web address]. After hours, contact General Neurology

## 2016-04-27 NOTE — Progress Notes (Signed)
Hypoglycemic Event  CBG: 62  Treatment: 15 GM carbohydrate snack  Symptoms: None  Follow-up CBG: Time: CBG Result:112  Possible Reasons for Event: Inadequate meal intake. Patient refused to eat breakfast with RN and nurse tech.   Comments/MD notified:Dr. Birdie HopesYates    Kendelle Schweers, Claria DiceKelly E

## 2016-04-27 NOTE — Discharge Instructions (Signed)
Fat and Cholesterol Restricted Diet High levels of fat and cholesterol in your blood may lead to various health problems, such as diseases of the heart, blood vessels, gallbladder, liver, and pancreas. Fats are concentrated sources of energy that come in various forms. Certain types of fat, including saturated fat, may be harmful in excess. Cholesterol is a substance needed by your body in small amounts. Your body makes all the cholesterol it needs. Excess cholesterol comes from the food you eat. When you have high levels of cholesterol and saturated fat in your blood, health problems can develop because the excess fat and cholesterol will gather along the walls of your blood vessels, causing them to narrow. Choosing the right foods will help you control your intake of fat and cholesterol. This will help keep the levels of these substances in your blood within normal limits and reduce your risk of disease. WHAT IS MY PLAN? Your health care provider recommends that you:  Get no more than __________ % of the total calories in your daily diet from fat.  Limit your intake of saturated fat to less than ______% of your total calories each day.  Limit the amount of cholesterol in your diet to less than _________mg per day. WHAT TYPES OF FAT SHOULD I CHOOSE?  Choose healthy fats more often. Choose monounsaturated and polyunsaturated fats, such as olive and canola oil, flaxseeds, walnuts, almonds, and seeds.  Eat more omega-3 fats. Good choices include salmon, mackerel, sardines, tuna, flaxseed oil, and ground flaxseeds. Aim to eat fish at least two times a week.  Limit saturated fats. Saturated fats are primarily found in animal products, such as meats, butter, and cream. Plant sources of saturated fats include palm oil, palm kernel oil, and coconut oil.  Avoid foods with partially hydrogenated oils in them. These contain trans fats. Examples of foods that contain trans fats are stick margarine, some tub  margarines, cookies, crackers, and other baked goods. WHAT GENERAL GUIDELINES DO I NEED TO FOLLOW? These guidelines for healthy eating will help you control your intake of fat and cholesterol:  Check food labels carefully to identify foods with trans fats or high amounts of saturated fat.  Fill one half of your plate with vegetables and green salads.  Fill one fourth of your plate with whole grains. Look for the word "whole" as the first word in the ingredient list.  Fill one fourth of your plate with lean protein foods.  Limit fruit to two servings a day. Choose fruit instead of juice.  Eat more foods that contain soluble fiber. Examples of foods that contain this type of fiber are apples, broccoli, carrots, beans, peas, and barley. Aim to get 20-30 g of fiber per day.  Eat more home-cooked food and less restaurant, buffet, and fast food.  Limit or avoid alcohol.  Limit foods high in starch and sugar.  Limit fried foods.  Cook foods using methods other than frying. Baking, boiling, grilling, and broiling are all great options.  Lose weight if you are overweight. Losing just 5-10% of your initial body weight can help your overall health and prevent diseases such as diabetes and heart disease. WHAT FOODS CAN I EAT? Grains Whole grains, such as whole wheat or whole grain breads, crackers, cereals, and pasta. Unsweetened oatmeal, bulgur, barley, quinoa, or brown rice. Corn or whole wheat flour tortillas. Vegetables Fresh or frozen vegetables (raw, steamed, roasted, or grilled). Green salads. Fruits All fresh, canned (in natural juice), or frozen fruits. Meat and  Other Protein Products Ground beef (85% or leaner), grass-fed beef, or beef trimmed of fat. Skinless chicken or [place]. Ground chicken or [place]. Pork trimmed of fat. All fish and seafood. Eggs. Dried beans, peas, or lentils. Unsalted nuts or seeds. Unsalted canned or dry beans. Dairy Low-fat dairy products, such as skim or  1% milk, 2% or reduced-fat cheeses, low-fat ricotta or cottage cheese, or plain low-fat yogurt. Fats and Oils Tub margarines without trans fats. Light or reduced-fat mayonnaise and salad dressings. Avocado. Olive, canola, sesame, or safflower oils. Natural peanut or almond butter (choose ones without added sugar and oil). The items listed above may not be a complete list of recommended foods or beverages. Contact your dietitian for more options. WHAT FOODS ARE NOT RECOMMENDED? Grains White bread. White pasta. White rice. Cornbread. Bagels, pastries, and croissants. Crackers that contain trans fat. Vegetables White potatoes. Corn. Creamed or fried vegetables. Vegetables in a cheese sauce. Fruits Dried fruits. Canned fruit in light or heavy syrup. Fruit juice. Meat and Other Protein Products Fatty cuts of meat. Ribs, chicken wings, bacon, sausage, bologna, salami, chitterlings, fatback, hot dogs, bratwurst, and packaged luncheon meats. Liver and organ meats. Dairy Whole or 2% milk, cream, half-and-half, and cream cheese. Whole milk cheeses. Whole-fat or sweetened yogurt. Full-fat cheeses. Nondairy creamers and whipped toppings. Processed cheese, cheese spreads, or cheese curds. Sweets and Desserts Corn syrup, sugars, honey, and molasses. Candy. Jam and jelly. Syrup. Sweetened cereals. Cookies, pies, cakes, donuts, muffins, and ice cream. Fats and Oils Butter, stick margarine, lard, shortening, ghee, or bacon fat. Coconut, palm kernel, or palm oils. Beverages Alcohol. Sweetened drinks (such as sodas, lemonade, and fruit drinks or punches). The items listed above may not be a complete list of foods and beverages to avoid. Contact your dietitian for more information.   This information is not intended to replace advice given to you by your health care provider. Make sure you discuss any questions you have with your health care provider.   Document Released: 10/15/2005 Document Revised: 11/05/2014  Document Reviewed: 01/13/2014 Elsevier Interactive Patient Education 2016 [employer]  Dysphagia Diet Level 1, Pureed The dysphasia level 1 diet includes foods that are completely pureed and smooth. The foods have a pudding-like texture, such as the texture of pureed pancakes, mashed potatoes, and yogurt. The diet does not include foods with lumps or coarse textures. Liquids should be smooth and may either be thin, nectar-thick, honey-like, or spoon-thick. This diet is helpful for people with moderate to severe swallowing problems. It reduces the risk of food getting caught in the windpipe, trachea, or lungs. You may need help or supervision during meals while following this diet. WHAT DO I NEED TO KNOW ABOUT THIS DIET? Foods  You may eat foods that are soft and have a pudding-like texture. If a food does not have this texture, you may be able to eat the food after:  Pureeing it. This can be done with a blender or whisk.  Moistening it with liquid. For example, you may have bread if you soak it in milk or syrup.  Avoid foods that are hard, dry, sticky, chunky, lumpy, or stringy. Also avoid foods with nuts, seeds, raisins, skins, and pulp.  Do not eat foods that you have to chew. If you have to chew the food, then you cannot eat it.  Eat a variety of foods to get all the nutrients you need. Liquids  You may drink liquids that are smooth. Your health care provider will tell  you if you should drink thin or thickened liquids.  To thicken a liquid, use a food and beverage thickener or a thickening food. Thickened liquids are usually a "pudding-like" consistency.  Thin liquids include fruit juices, milk, coffee, tea, yogurts, shakes, and similar foods that melt to thin liquid at room temperature.  Avoid liquids with seeds, pulp, or chunks. See your dietitian or health care provider regularly for help with your dietary changes. WHAT FOODS CAN I EAT? Grains Store-bought soft breads,  pancakes, and [place] toast that have a smooth, moist texture and do not have nuts or seeds (you will need to moisten the food with liquid). Cooked cereals that have a pudding-like consistency, such as cream of wheat or farina (no oatmeal). Pureed, well-cooked pasta, rice, and plain bread stuffing. Vegetables Pureed vegetables. Soft avocado. Smooth tomato paste or sauce. Strained or pureed soups (these may need to be thickened as directed). Mashed or pureed potatoes without skin (can be seasoned with butter, smooth gravy, margarine, or sour cream). Fruits Pureed fruits such as melons and apples without seeds or pulp. Mashed bananas. Smooth tomato paste or sauce. Fruit juices without pulp or seeds. Strained or pureed soups. Meat and Other Protein Sources Pureed meat. Smooth pate or liverwurst. Smooth souffles. Pureed beans (such as lentils). Pureed eggs. Dairy Yogurt. Smooth cheese sauces. Milk (may need to be thickened). Nutritional dairy drinks or shakes. Ask your health care provider whether you can have ice cream. Condiments Finely ground salt, pepper, and other ground spices. Sweets/Desserts Smooth puddings and custards. Pureed desserts. Souffles. Whipped topping. Ask your health care provider whether you can have frozen desserts. Fats and Oils Butter. Margarine. Smooth and strained gravy. Sour cream. Mayonnaise. Cream cheese. Whipped topping. Smooth sauces (such as white sauce, cheese sauce, or hollandaise sauce). The items listed above may not be a complete list of recommended foods or beverages. Contact your dietitian for more options. WHAT FOODS ARE NOT RECOMMENDED? Grains Oatmeal. Dry cereals. Hard breads. Vegetables Whole vegetables. Stringy vegetables (such as celery). Thin tomato sauce. Fruits Whole fresh, frozen, canned, or dried fruits that have not been pureed. Stringy fruits (such as pineapple). Meat and Other Protein Sources Whole or ground meat, fish, or poultry. Dried or  cooked lentils or legumes that have been cooked but not mashed or pureed. Non-pureed eggs. Nuts and seeds. Peanut butter. Dairy Non-pureed cheese. Dairy products with lumps or chunks. Ask your health care provider whether you can have ice cream. Condiments Coarse or seeded herbs and spices. Sweets/Desserts Ponder preserves. Jams with seeds. Solid desserts. Sticky, chewy sweets (such as licorice and caramel). Ask your health care provider whether you can have frozen desserts. Fats and Oils Sauces of fats with lumps or chunks. The items listed above may not be a complete list of foods and beverages to avoid. Contact your dietitian for more information.   This information is not intended to replace advice given to you by your health care provider. Make sure you discuss any questions you have with your health care provider.   Document Released: 10/15/2005 Document Revised: 11/05/2014 Document Reviewed: 09/28/2013 Elsevier Interactive Patient Education [employer]

## 2016-04-27 NOTE — Progress Notes (Signed)
Patient discharged to home. RN discussed discharge instructions with patient's caretakers (daughter and granddaughter). They both vocalized understanding of discharge instructions including follow up appointment to PCP, resume home meds (no change per MD), understands puree heart healthy diet. Neuro assessment unchanged. Tele removed, IV removed. Tech is dressing patient. Tech will escort patient to care. Patient's family states they are unhappy with care, feel they are "left out." States they have not been spoken to by doctors, not informed of discharge plan. RN explained discharge instructions, offered to page MD to speak with family about test results, discharge plans. Offered x3, family refused each time. States they are "tired and want to go home." ChiropodistAssistant director is aware, MD spoke with patient's care taker (daughter) earlier today.

## 2018-01-21 ENCOUNTER — Inpatient Hospital Stay (HOSPITAL_COMMUNITY)
Admission: EM | Admit: 2018-01-21 | Discharge: 2018-01-27 | DRG: 280 | Disposition: A | Discharge status: Hospice | Payer: Medicare Other | Attending: Internal Medicine | Admitting: Internal Medicine

## 2018-01-21 ENCOUNTER — Emergency Department (HOSPITAL_COMMUNITY): Payer: Medicare Other

## 2018-01-21 ENCOUNTER — Other Ambulatory Visit: Payer: Self-pay

## 2018-01-21 ENCOUNTER — Encounter (HOSPITAL_COMMUNITY): Payer: Self-pay

## 2018-01-21 DIAGNOSIS — R195 Other fecal abnormalities: Secondary | ICD-10-CM | POA: Diagnosis not present

## 2018-01-21 DIAGNOSIS — I5030 Unspecified diastolic (congestive) heart failure: Secondary | ICD-10-CM | POA: Diagnosis not present

## 2018-01-21 DIAGNOSIS — E11649 Type 2 diabetes mellitus with hypoglycemia without coma: Secondary | ICD-10-CM | POA: Diagnosis present

## 2018-01-21 DIAGNOSIS — Z8673 Personal history of transient ischemic attack (TIA), and cerebral infarction without residual deficits: Secondary | ICD-10-CM

## 2018-01-21 DIAGNOSIS — I214 Non-ST elevation (NSTEMI) myocardial infarction: Secondary | ICD-10-CM | POA: Diagnosis not present

## 2018-01-21 DIAGNOSIS — N271 Small kidney, bilateral: Secondary | ICD-10-CM | POA: Diagnosis present

## 2018-01-21 DIAGNOSIS — N189 Chronic kidney disease, unspecified: Secondary | ICD-10-CM | POA: Diagnosis not present

## 2018-01-21 DIAGNOSIS — N184 Chronic kidney disease, stage 4 (severe): Secondary | ICD-10-CM | POA: Diagnosis present

## 2018-01-21 DIAGNOSIS — R109 Unspecified abdominal pain: Secondary | ICD-10-CM | POA: Diagnosis not present

## 2018-01-21 DIAGNOSIS — I2510 Atherosclerotic heart disease of native coronary artery without angina pectoris: Secondary | ICD-10-CM | POA: Diagnosis present

## 2018-01-21 DIAGNOSIS — Z515 Encounter for palliative care: Secondary | ICD-10-CM | POA: Diagnosis present

## 2018-01-21 DIAGNOSIS — G9341 Metabolic encephalopathy: Secondary | ICD-10-CM | POA: Diagnosis present

## 2018-01-21 DIAGNOSIS — I130 Hypertensive heart and chronic kidney disease with heart failure and stage 1 through stage 4 chronic kidney disease, or unspecified chronic kidney disease: Secondary | ICD-10-CM | POA: Diagnosis present

## 2018-01-21 DIAGNOSIS — E1122 Type 2 diabetes mellitus with diabetic chronic kidney disease: Secondary | ICD-10-CM | POA: Diagnosis present

## 2018-01-21 DIAGNOSIS — J690 Pneumonitis due to inhalation of food and vomit: Secondary | ICD-10-CM | POA: Diagnosis present

## 2018-01-21 DIAGNOSIS — Z7982 Long term (current) use of aspirin: Secondary | ICD-10-CM

## 2018-01-21 DIAGNOSIS — N390 Urinary tract infection, site not specified: Secondary | ICD-10-CM | POA: Diagnosis present

## 2018-01-21 DIAGNOSIS — G309 Alzheimer's disease, unspecified: Secondary | ICD-10-CM | POA: Diagnosis present

## 2018-01-21 DIAGNOSIS — K922 Gastrointestinal hemorrhage, unspecified: Secondary | ICD-10-CM | POA: Diagnosis present

## 2018-01-21 DIAGNOSIS — G9340 Encephalopathy, unspecified: Secondary | ICD-10-CM | POA: Diagnosis present

## 2018-01-21 DIAGNOSIS — D649 Anemia, unspecified: Secondary | ICD-10-CM | POA: Diagnosis not present

## 2018-01-21 DIAGNOSIS — D631 Anemia in chronic kidney disease: Secondary | ICD-10-CM | POA: Diagnosis present

## 2018-01-21 DIAGNOSIS — F03918 Unspecified dementia, unspecified severity, with other behavioral disturbance: Secondary | ICD-10-CM | POA: Diagnosis present

## 2018-01-21 DIAGNOSIS — I252 Old myocardial infarction: Secondary | ICD-10-CM

## 2018-01-21 DIAGNOSIS — N179 Acute kidney failure, unspecified: Secondary | ICD-10-CM | POA: Diagnosis not present

## 2018-01-21 DIAGNOSIS — I160 Hypertensive urgency: Secondary | ICD-10-CM | POA: Diagnosis present

## 2018-01-21 DIAGNOSIS — D509 Iron deficiency anemia, unspecified: Secondary | ICD-10-CM | POA: Diagnosis present

## 2018-01-21 DIAGNOSIS — R0902 Hypoxemia: Secondary | ICD-10-CM | POA: Diagnosis present

## 2018-01-21 DIAGNOSIS — Z66 Do not resuscitate: Secondary | ICD-10-CM | POA: Diagnosis not present

## 2018-01-21 DIAGNOSIS — E1100 Type 2 diabetes mellitus with hyperosmolarity without nonketotic hyperglycemic-hyperosmolar coma (NKHHC): Secondary | ICD-10-CM | POA: Diagnosis present

## 2018-01-21 DIAGNOSIS — I5021 Acute systolic (congestive) heart failure: Secondary | ICD-10-CM | POA: Diagnosis present

## 2018-01-21 DIAGNOSIS — Z7189 Other specified counseling: Secondary | ICD-10-CM | POA: Diagnosis not present

## 2018-01-21 DIAGNOSIS — R4182 Altered mental status, unspecified: Secondary | ICD-10-CM | POA: Diagnosis present

## 2018-01-21 DIAGNOSIS — I21A1 Myocardial infarction type 2: Principal | ICD-10-CM | POA: Diagnosis present

## 2018-01-21 DIAGNOSIS — F0281 Dementia in other diseases classified elsewhere with behavioral disturbance: Secondary | ICD-10-CM | POA: Diagnosis present

## 2018-01-21 DIAGNOSIS — I255 Ischemic cardiomyopathy: Secondary | ICD-10-CM | POA: Diagnosis present

## 2018-01-21 DIAGNOSIS — Z794 Long term (current) use of insulin: Secondary | ICD-10-CM

## 2018-01-21 DIAGNOSIS — N183 Chronic kidney disease, stage 3 (moderate): Secondary | ICD-10-CM

## 2018-01-21 DIAGNOSIS — I10 Essential (primary) hypertension: Secondary | ICD-10-CM | POA: Diagnosis not present

## 2018-01-21 DIAGNOSIS — Z79899 Other long term (current) drug therapy: Secondary | ICD-10-CM

## 2018-01-21 DIAGNOSIS — E860 Dehydration: Secondary | ICD-10-CM | POA: Diagnosis present

## 2018-01-21 DIAGNOSIS — E1110 Type 2 diabetes mellitus with ketoacidosis without coma: Secondary | ICD-10-CM | POA: Diagnosis present

## 2018-01-21 DIAGNOSIS — F0391 Unspecified dementia with behavioral disturbance: Secondary | ICD-10-CM | POA: Diagnosis present

## 2018-01-21 DIAGNOSIS — M1990 Unspecified osteoarthritis, unspecified site: Secondary | ICD-10-CM | POA: Diagnosis present

## 2018-01-21 DIAGNOSIS — Z9049 Acquired absence of other specified parts of digestive tract: Secondary | ICD-10-CM

## 2018-01-21 DIAGNOSIS — N170 Acute kidney failure with tubular necrosis: Secondary | ICD-10-CM | POA: Diagnosis present

## 2018-01-21 DIAGNOSIS — E1165 Type 2 diabetes mellitus with hyperglycemia: Secondary | ICD-10-CM

## 2018-01-21 LAB — TYPE AND SCREEN
ABO/RH(D): O POS
ANTIBODY SCREEN: NEGATIVE

## 2018-01-21 LAB — URINALYSIS, ROUTINE W REFLEX MICROSCOPIC
BILIRUBIN URINE: NEGATIVE
KETONES UR: 5 mg/dL — AB
NITRITE: NEGATIVE
PH: 5 (ref 5.0–8.0)
Specific Gravity, Urine: 1.012 (ref 1.005–1.030)

## 2018-01-21 LAB — COMPREHENSIVE METABOLIC PANEL
ALBUMIN: 3.8 g/dL (ref 3.5–5.0)
ALK PHOS: 111 U/L (ref 38–126)
ALT: 49 U/L (ref 14–54)
AST: 74 U/L — ABNORMAL HIGH (ref 15–41)
Anion gap: 16 — ABNORMAL HIGH (ref 5–15)
BUN: 57 mg/dL — ABNORMAL HIGH (ref 6–20)
CALCIUM: 8.3 mg/dL — AB (ref 8.9–10.3)
CHLORIDE: 100 mmol/L — AB (ref 101–111)
CO2: 21 mmol/L — AB (ref 22–32)
CREATININE: 5.79 mg/dL — AB (ref 0.44–1.00)
GFR calc Af Amer: 7 mL/min — ABNORMAL LOW (ref >60)
GFR calc non Af Amer: 6 mL/min — ABNORMAL LOW (ref >60)
GLUCOSE: 336 mg/dL — AB (ref 65–99)
Potassium: 4.5 mmol/L (ref 3.5–5.1)
SODIUM: 137 mmol/L (ref 135–145)
Total Bilirubin: 1.8 mg/dL — ABNORMAL HIGH (ref 0.3–1.2)
Total Protein: 7.4 g/dL (ref 6.5–8.1)

## 2018-01-21 LAB — HEMOGLOBIN AND HEMATOCRIT, BLOOD
HEMATOCRIT: 23.9 % — AB (ref 36.0–46.0)
HEMATOCRIT: 25.2 % — AB (ref 36.0–46.0)
Hemoglobin: 8.2 g/dL — ABNORMAL LOW (ref 12.0–15.0)
Hemoglobin: 8.5 g/dL — ABNORMAL LOW (ref 12.0–15.0)

## 2018-01-21 LAB — BLOOD GAS, VENOUS
ACID-BASE DEFICIT: 2.9 mmol/L — AB (ref 0.0–2.0)
BICARBONATE: 21.7 mmol/L (ref 20.0–28.0)
O2 SAT: 31.4 %
PCO2 VEN: 39.4 mmHg — AB (ref 44.0–60.0)
Patient temperature: 98.6
pH, Ven: 7.361 (ref 7.250–7.430)

## 2018-01-21 LAB — PROTIME-INR
INR: 1.39
Prothrombin Time: 16.9 seconds — ABNORMAL HIGH (ref 11.4–15.2)

## 2018-01-21 LAB — CBC WITH DIFFERENTIAL/PLATELET
BASOS ABS: 0 10*3/uL (ref 0.0–0.1)
Basophils Relative: 0 %
EOS ABS: 0 10*3/uL (ref 0.0–0.7)
Eosinophils Relative: 0 %
HCT: 25.6 % — ABNORMAL LOW (ref 36.0–46.0)
HEMOGLOBIN: 8.6 g/dL — AB (ref 12.0–15.0)
LYMPHS ABS: 1 10*3/uL (ref 0.7–4.0)
Lymphocytes Relative: 15 %
MCH: 30 pg (ref 26.0–34.0)
MCHC: 33.6 g/dL (ref 30.0–36.0)
MCV: 89.2 fL (ref 78.0–100.0)
Monocytes Absolute: 0.5 10*3/uL (ref 0.1–1.0)
Monocytes Relative: 7 %
NEUTROS PCT: 78 %
Neutro Abs: 5.5 10*3/uL (ref 1.7–7.7)
PLATELETS: 299 10*3/uL (ref 150–400)
RBC: 2.87 MIL/uL — ABNORMAL LOW (ref 3.87–5.11)
RDW: 14.2 % (ref 11.5–15.5)
WBC: 7 10*3/uL (ref 4.0–10.5)

## 2018-01-21 LAB — LACTIC ACID, PLASMA
LACTIC ACID, VENOUS: 2.5 mmol/L — AB (ref 0.5–1.9)
Lactic Acid, Venous: 2.5 mmol/L (ref 0.5–1.9)

## 2018-01-21 LAB — RETICULOCYTES
RBC.: 2.89 MIL/uL — ABNORMAL LOW (ref 3.87–5.11)
RETIC COUNT ABSOLUTE: 141.6 10*3/uL (ref 19.0–186.0)
RETIC CT PCT: 4.9 % — AB (ref 0.4–3.1)

## 2018-01-21 LAB — TROPONIN I
TROPONIN I: 0.21 ng/mL — AB (ref <0.03)
Troponin I: 0.19 ng/mL (ref <0.03)
Troponin I: 0.21 ng/mL (ref <0.03)

## 2018-01-21 LAB — IRON AND TIBC
Iron: 41 ug/dL (ref 28–170)
Saturation Ratios: 17 % (ref 10.4–31.8)
TIBC: 245 ug/dL — AB (ref 250–450)
UIBC: 204 ug/dL

## 2018-01-21 LAB — FOLATE: Folate: 12.6 ng/mL (ref >5.9)

## 2018-01-21 LAB — FERRITIN: FERRITIN: 210 ng/mL (ref 11–307)

## 2018-01-21 LAB — SODIUM, URINE, RANDOM: Sodium, Ur: <10 mmol/L

## 2018-01-21 LAB — PROTEIN / CREATININE RATIO, URINE
Creatinine, Urine: 164.84 mg/dL
PROTEIN CREATININE RATIO: 2.55 mg/mg{creat} — AB (ref 0.00–0.15)
Total Protein, Urine: 421 mg/dL

## 2018-01-21 LAB — MRSA PCR SCREENING: MRSA by PCR: NEGATIVE

## 2018-01-21 LAB — POC OCCULT BLOOD, ED: Fecal Occult Bld: POSITIVE — AB

## 2018-01-21 LAB — VITAMIN B12: Vitamin B-12: 1605 pg/mL — ABNORMAL HIGH (ref 180–914)

## 2018-01-21 LAB — CBG MONITORING, ED
GLUCOSE-CAPILLARY: 319 mg/dL — AB (ref 65–99)
Glucose-Capillary: 276 mg/dL — ABNORMAL HIGH (ref 65–99)

## 2018-01-21 LAB — GLUCOSE, CAPILLARY
GLUCOSE-CAPILLARY: 285 mg/dL — AB (ref 65–99)
GLUCOSE-CAPILLARY: 249 mg/dL — AB (ref 65–99)
Glucose-Capillary: 213 mg/dL — ABNORMAL HIGH (ref 65–99)

## 2018-01-21 LAB — ABO/RH: ABO/RH(D): O POS

## 2018-01-21 MED ORDER — SODIUM CHLORIDE 0.45 % IV SOLN: INTRAVENOUS | Status: DC

## 2018-01-21 MED ORDER — PIPERACILLIN-TAZOBACTAM 3.375 G IVPB 30 MIN
3.3750 g | Freq: Once | INTRAVENOUS | Status: AC
  Administered 2018-01-21: 3.375 g via INTRAVENOUS
  Filled 2018-01-21: qty 50

## 2018-01-21 MED ORDER — HYDRALAZINE HCL 20 MG/ML IJ SOLN
10.0000 mg | INTRAMUSCULAR | Status: DC
  Administered 2018-01-21 – 2018-01-22 (×7): 10 mg via INTRAVENOUS
  Filled 2018-01-21 (×7): qty 1

## 2018-01-21 MED ORDER — LACTATED RINGERS IV BOLUS
1000.0000 mL | Freq: Once | INTRAVENOUS | Status: AC
  Administered 2018-01-21: 1000 mL via INTRAVENOUS

## 2018-01-21 MED ORDER — FAMOTIDINE 10 MG PO TABS
10.0000 mg | ORAL_TABLET | Freq: Every morning | ORAL | Status: DC
  Filled 2018-01-21: qty 1

## 2018-01-21 MED ORDER — SODIUM CHLORIDE 0.9 % IV SOLN
2000.0000 mg | Freq: Once | INTRAVENOUS | Status: AC
  Administered 2018-01-21: 2000 mg via INTRAVENOUS
  Filled 2018-01-21: qty 2000

## 2018-01-21 MED ORDER — LORAZEPAM 2 MG/ML IJ SOLN
0.5000 mg | Freq: Once | INTRAMUSCULAR | Status: AC
  Administered 2018-01-21: 0.5 mg via INTRAVENOUS
  Filled 2018-01-21: qty 1

## 2018-01-21 MED ORDER — CLONIDINE HCL 0.1 MG PO TABS
0.2000 mg | ORAL_TABLET | Freq: Two times a day (BID) | ORAL | Status: DC
  Administered 2018-01-22: 0.2 mg via ORAL
  Filled 2018-01-21 (×2): qty 2

## 2018-01-21 MED ORDER — DEXTROSE 50 % IV SOLN
25.0000 mL | INTRAVENOUS | Status: DC | PRN
  Administered 2018-01-22: 13 mL via INTRAVENOUS
  Administered 2018-01-22 – 2018-01-23 (×2): 25 mL via INTRAVENOUS
  Filled 2018-01-21 (×2): qty 50

## 2018-01-21 MED ORDER — FENTANYL CITRATE (PF) 100 MCG/2ML IJ SOLN
50.0000 ug | Freq: Once | INTRAMUSCULAR | Status: AC
  Administered 2018-01-21: 50 ug via INTRAVENOUS
  Filled 2018-01-21: qty 2

## 2018-01-21 MED ORDER — PRAVASTATIN SODIUM 40 MG PO TABS
80.0000 mg | ORAL_TABLET | ORAL | Status: DC
  Administered 2018-01-23 – 2018-01-27 (×5): 80 mg via ORAL
  Filled 2018-01-21: qty 2
  Filled 2018-01-21: qty 4
  Filled 2018-01-21: qty 2
  Filled 2018-01-21 (×2): qty 4

## 2018-01-21 MED ORDER — SODIUM CHLORIDE 0.9 % IV SOLN
1.0000 g | INTRAVENOUS | Status: DC
  Administered 2018-01-21 – 2018-01-23 (×3): 1 g via INTRAVENOUS
  Filled 2018-01-21: qty 1
  Filled 2018-01-21: qty 10
  Filled 2018-01-21: qty 1

## 2018-01-21 MED ORDER — INSULIN REGULAR BOLUS VIA INFUSION
0.0000 [IU] | Freq: Three times a day (TID) | INTRAVENOUS | Status: DC
  Filled 2018-01-21: qty 10

## 2018-01-21 MED ORDER — SODIUM CHLORIDE 0.9 % IV SOLN
INTRAVENOUS | Status: DC
  Administered 2018-01-21: 2.2 [IU]/h via INTRAVENOUS
  Filled 2018-01-21: qty 1

## 2018-01-21 MED ORDER — CARVEDILOL 6.25 MG PO TABS
6.2500 mg | ORAL_TABLET | Freq: Two times a day (BID) | ORAL | Status: DC
  Administered 2018-01-23 – 2018-01-27 (×10): 6.25 mg via ORAL
  Filled 2018-01-21 (×12): qty 1

## 2018-01-21 MED ORDER — SODIUM CHLORIDE 0.9 % IV BOLUS
1000.0000 mL | Freq: Once | INTRAVENOUS | Status: AC
  Administered 2018-01-21: 1000 mL via INTRAVENOUS

## 2018-01-21 MED ORDER — DEXTROSE-NACL 5-0.45 % IV SOLN
INTRAVENOUS | Status: DC
  Administered 2018-01-21: via INTRAVENOUS

## 2018-01-21 MED ORDER — NITROGLYCERIN 0.4 MG SL SUBL: 0.4000 mg | SUBLINGUAL_TABLET | SUBLINGUAL | Status: DC | PRN

## 2018-01-21 MED ORDER — FENTANYL CITRATE (PF) 100 MCG/2ML IJ SOLN: 25.0000 ug | INTRAMUSCULAR | Status: DC | PRN

## 2018-01-21 MED ORDER — SODIUM CHLORIDE 0.9% FLUSH
3.0000 mL | Freq: Two times a day (BID) | INTRAVENOUS | Status: DC
  Administered 2018-01-22 – 2018-01-27 (×10): 3 mL via INTRAVENOUS

## 2018-01-21 NOTE — ED Notes (Signed)
Pt has been thrashing around in bed and is yelling out. Pt is not making sense and is very agitated. RN attempted to redirect pt. Pt is very confused and is combative.

## 2018-01-21 NOTE — ED Notes (Signed)
Blood gas venous result given to Dr. Clayborne DanaMesner.

## 2018-01-21 NOTE — ED Provider Notes (Signed)
Emergency Department Provider Note   I have reviewed the triage vital signs and the nursing notes.   HISTORY  Chief Complaint No chief complaint on file.   HPI Tammy Huff is a 77 y.o. female with multiple medical problems as documented below the presents to the emergency department today secondary to multiple symptoms.  Patient not able to provide history secondary to dementia but her daughter is with her who is states that the patient has had few days of progressively worsening vomiting and decreased p.o. intake.  She started having some abdominal pain a couple days ago but that she started to become more altered as well as she is not acting herself she is more confused and she is generally weak.  She has some decreased urine output.  No diarrhea or constipation but has had once again multiple episodes of vomiting.  No fevers but has had a runny nose.  No cough.  Is refusing to take all her medications besides Trulicity. No other associated or modifying symptoms.   LEVEL V CAVEAT SECONDARY TO DEMENTIA, DAUGHTER PROVIDES HISTORY  Past Medical History:  Diagnosis Date  . Arthritis   . Dementia    advanced  . Diabetes mellitus without complication (HCC)   . Diverticulitis   . Hypertension     Patient Active Problem List   Diagnosis Date Noted  . Acute encephalopathy 01/21/2018  . GI bleeding 01/21/2018  . Hypoxia 01/21/2018  . Acute lower UTI 01/21/2018  . DKA (diabetic ketoacidosis) (HCC) 01/21/2018  . NSTEMI (non-ST elevated myocardial infarction) (HCC) 01/21/2018  . Palliative care encounter   . Transient ischemia 04/25/2016  . CKD (chronic kidney disease) 04/25/2016  . Dementia with behavioral disturbance 04/25/2016  . GERD (gastroesophageal reflux disease) 04/25/2016  . Slurred speech 04/25/2016  . Diabetes mellitus with complication (HCC)   . Counseling regarding goals of care   . Diabetes mellitus (HCC) 12/26/2015  . DM hyperosmolarity type II, uncontrolled  (HCC) 11/26/2014  . Essential hypertension 11/26/2014  . Dehydration 11/26/2014  . Renal failure, acute on chronic (HCC) 11/26/2014  . Vertigo 08/12/2014  . DM2 (diabetes mellitus, type 2) (HCC) 08/12/2014  . HTN (hypertension) 08/12/2014  . HLD (hyperlipidemia) 08/12/2014    Past Surgical History:  Procedure Laterality Date  . COLON RESECTION      Current Outpatient Rx  . Order #: [id number] Class: Historical Med  . Order #: [id number] Class: Normal  . Order #: [id number] Class: Print  . Order #: [id number] Class: Print  . Order #: [id number] Class: Historical Med  . Order #: [id number] Class: Historical Med  . Order #: [id number] Class: Historical Med  . Order #: [id number] Class: Historical Med  . Order #: [id number] Class: Print  . Order #: [id number] Class: Print  . Order #: [id number] Class: Historical Med  . Order #: [id number] Class: Print  . Order #: [id number] Class: Print  . Order #: [id number] Class: Print    Allergies Patient has no known allergies.  Family History  Family history unknown: Yes    Social History Social History   Tobacco Use  . Smoking status: Never Smoker  . Smokeless tobacco: Never Used  Substance Use Topics  . Alcohol use: No  . Drug use: Never    Review of Systems  LEVEL V CAVEAT SECONDARY TO DEMENTIA, DAUGHTER PROVIDES HISTORY ____________________________________________   PHYSICAL EXAM:  VITAL SIGNS: ED Triage Vitals  Enc Vitals Group     BP 01/21/18 0930 (!) 173/99     Pulse Rate 01/21/18 0930 88     Resp  01/21/18 0930 16     Temp 01/21/18 0930 97.6 F (36.4 C)     Temp Source 01/21/18 0930 Oral     SpO2 01/21/18 0930 93 %    Constitutional: Alert.. Well appearing and in no acute distress. Eyes: Scleral icterus. PERRL. EOMI. Head: Atraumatic. Nose: No congestion/rhinnorhea. Mouth/Throat: Mucous membranes are moist.  Oropharynx non-erythematous. Neck: No stridor.  No meningeal signs.   Cardiovascular: Normal rate, regular rhythm. Good  peripheral circulation. Grossly normal heart sounds.   Respiratory: tachypneic respiratory effort.   Gastrointestinal: Soft but diffusely tender. No distention.  Musculoskeletal: No lower extremity tenderness nor edema. No gross deformities of extremities. Neurologic:  Normal speech and language. No gross focal neurologic deficits are appreciated.  Skin:  Skin is warm, dry and intact. No rash noted.   ____________________________________________   LABS (all labs ordered are listed, but only abnormal results are displayed)  Labs Reviewed  CBC WITH DIFFERENTIAL/PLATELET - Abnormal; Notable for the following components:      Result Value   RBC 2.87 (*)    Hemoglobin 8.6 (*)    HCT 25.6 (*)    All other components within normal limits  COMPREHENSIVE METABOLIC PANEL - Abnormal; Notable for the following components:   Chloride 100 (*)    CO2 21 (*)    Glucose, Bld 336 (*)    BUN 57 (*)    Creatinine, Ser 5.79 (*)    Calcium 8.3 (*)    AST 74 (*)    Total Bilirubin 1.8 (*)    GFR calc non Af Amer 6 (*)    GFR calc Af Amer 7 (*)    Anion gap 16 (*)    All other components within normal limits  BLOOD GAS, VENOUS - Abnormal; Notable for the following components:   pCO2, Ven 39.4 (*)    Acid-base deficit 2.9 (*)    All other components within normal limits  LACTIC ACID, PLASMA - Abnormal; Notable for the following components:   Lactic Acid, Venous 2.5 (*)    All other components within normal limits  TROPONIN I - Abnormal; Notable for the following components:   Troponin I 0.19 (*)    All other components within normal limits  URINALYSIS, ROUTINE W REFLEX MICROSCOPIC - Abnormal; Notable for the following components:   Glucose, UA >=500 (*)    Hgb urine dipstick MODERATE (*)    Ketones, ur 5 (*)    Protein, ur >=300 (*)    Leukocytes, UA TRACE (*)    Bacteria, UA RARE (*)    Squamous Epithelial / LPF 0-5 (*)    All other components within normal limits  VITAMIN B12 - Abnormal;  Notable for the following components:   Vitamin B-12 1,605 (*)    All other components within normal limits  IRON AND TIBC - Abnormal; Notable for the following components:   TIBC 245 (*)    All other components within normal limits  RETICULOCYTES - Abnormal; Notable for the following components:   Retic Ct Pct 4.9 (*)    RBC. 2.89 (*)    All other components within normal limits  LACTIC ACID, PLASMA - Abnormal; Notable for the following components:   Lactic Acid, Venous 2.5 (*)    All other components within normal limits  TROPONIN I - Abnormal; Notable for the following components:   Troponin I 0.21 (*)    All other components within normal limits  HEMOGLOBIN AND HEMATOCRIT, BLOOD - Abnormal; Notable for the following  components:   Hemoglobin 8.2 (*)    HCT 23.9 (*)    All other components within normal limits  CBG MONITORING, ED - Abnormal; Notable for the following components:   Glucose-Capillary 319 (*)    All other components within normal limits  POC OCCULT BLOOD, ED - Abnormal; Notable for the following components:   Fecal Occult Bld POSITIVE (*)    All other components within normal limits  CBG MONITORING, ED - Abnormal; Notable for the following components:   Glucose-Capillary 276 (*)    All other components within normal limits  URINE CULTURE  FERRITIN  FOLATE  OCCULT BLOOD X 1 CARD TO LAB, STOOL  TROPONIN I  TROPONIN I  TROPONIN I  PROTEIN / CREATININE RATIO, URINE  SODIUM, URINE, RANDOM  HEMOGLOBIN A1C  CBC  PROTIME-INR  HEMOGLOBIN AND HEMATOCRIT, BLOOD  TYPE AND SCREEN  ABO/RH   ____________________________________________  EKG   EKG Interpretation  Date/Time:  Tuesday January 21 2018 11:43:10 EDT Ventricular Rate:  85 PR Interval:    QRS Duration: 93 QT Interval:  427 QTC Calculation: 508 R Axis:   101 Text Interpretation:  Sinus rhythm Anteroseptal infarct, age indeterminate Lateral leads are also involved Prolonged QT interval new TWI in II,  aVF, V5 and V6 Confirmed by Marily MemosMesner, Leldon Steege [phone]([id number]) on 01/21/2018 2:44:41 PM       ____________________________________________  RADIOLOGY  Ct Abdomen Pelvis Wo Contrast  Result Date: 01/21/2018 CLINICAL DATA:  Hyperglycemia, generalize weakness and nausea EXAM: CT ABDOMEN AND PELVIS WITHOUT CONTRAST TECHNIQUE: Multidetector CT imaging of the abdomen and pelvis was performed following the standard protocol without IV contrast. COMPARISON:  None. FINDINGS: Lower chest: Small bilateral pleural effusions with bibasilar atelectasis. Stable cardiomegaly. Bilateral interstitial prominence. Hepatobiliary: No focal liver abnormality is seen. No gallstones, gallbladder wall thickening, or biliary dilatation. Pancreas: Unremarkable. No pancreatic ductal dilatation or surrounding inflammatory changes. Spleen: Normal in size without focal abnormality. Adrenals/Urinary Tract: Adrenal glands are unremarkable. 18 mm hypodense, fluid attenuating left renal mass most consistent with a cyst. No urolithiasis or obstructive uropathy. Relatively decompressed bladder. Stomach/Bowel: Stomach is within normal limits. No evidence of bowel wall thickening, distention, or inflammatory changes. Vascular/Lymphatic: Abdominal aortic atherosclerosis. Normal caliber abdominal aorta. No lymphadenopathy. Reproductive: Uterus and bilateral adnexa are unremarkable. Other: No abdominal wall hernia or abnormality. Trace perihepatic ascites along the inferior margin. Musculoskeletal: No acute osseous abnormality. No aggressive osseous lesion. Degenerative disc disease disc height loss at T12-L1 and L2-3. IMPRESSION: 1. No acute abdominal or pelvic pathology. 2. Small bilateral pleural effusions and mild interstitial edema. Electronically Signed   By: Elige KoHetal  Patel   On: 01/21/2018 13:53   Dg Chest 2 View  Result Date: 01/21/2018 CLINICAL DATA:  Generalized weakness.  Shortness of breath. EXAM: CHEST - 2 VIEW COMPARISON:  Chest x-ray dated Mar 14, 2016. FINDINGS: Borderline cardiomegaly. Mild pulmonary vascular congestion. New small bilateral pleural effusions with bibasilar airspace opacities. No pneumothorax. No acute osseous abnormality. IMPRESSION: 1. New small bilateral pleural effusions with bibasilar atelectasis versus infiltrates. Electronically Signed   By: Obie DredgeWilliam T Derry M.D.   On: 01/21/2018 10:46    ____________________________________________   PROCEDURES  Procedure(s) performed:   Procedures  CRITICAL CARE Performed by: Marily MemosMesner, Shenica Holzheimer Total critical care time: 35 minutes Critical care time was exclusive of separately billable procedures and treating other patients. Critical care was necessary to treat or prevent imminent or life-threatening deterioration. Critical care was time spent personally by me on the following activities: development of  treatment plan with patient and/or surrogate as well as nursing, discussions with consultants, evaluation of patient's response to treatment, examination of patient, obtaining history from patient or surrogate, ordering and performing treatments and interventions, ordering and review of laboratory studies, ordering and review of radiographic studies, pulse oximetry and re-evaluation of patient's condition.  ____________________________________________   INITIAL IMPRESSION / ASSESSMENT AND PLAN / ED COURSE  Patient tachypneic, altered high blood sugar concern for diabetic ketoacidosis.  Also concern for some type of intra-abdominal process that she is tender throughout her abdomen.  Could also be urinary tract infection.  Could also be related to metabolic issues otherwise.  Really have any lung findings to suggest pneumonia however she is slightly hypoxic with oxygen 93 and on my evaluation she is tachypneic and 25 range so PE certainly could be a possibility.  Will evaluate initially with labs and chest x-ray and decide if she needs CT scans or other imaging based on what I  find.  Patient workup revealed acute on chronic kidney failure, fluids started. abx already ordered. Also found to have new TWI's on ecg with elevated troponin. With anemia and likely Gi bleed, will not start heparin at this time. Discussed with Dr. Sharyn Lull, will see in consultation. Discussed with medicine who will admit for further workup and management.      Pertinent labs & imaging results that were available during my care of the patient were reviewed by me and considered in my medical decision making (see chart for details).  ____________________________________________  FINAL CLINICAL IMPRESSION(S) / ED DIAGNOSES  Final diagnoses:  Anemia, unspecified type  Abdominal pain, unspecified abdominal location  Hypoxia  Acute renal failure superimposed on chronic kidney disease, unspecified CKD stage, unspecified acute renal failure type (HCC)  Gastrointestinal hemorrhage, unspecified gastrointestinal hemorrhage type  NSTEMI (non-ST elevated myocardial infarction) (HCC)     MEDICATIONS GIVEN DURING THIS VISIT:  Medications  cefTRIAXone (ROCEPHIN) 1 g in sodium chloride 0.9 % 100 mL IVPB (1 g Intravenous New Bag/Given 01/21/18 1831)  cloNIDine (CATAPRES) tablet 0.2 mg (has no administration in time range)  carvedilol (COREG) tablet 6.25 mg (has no administration in time range)  hydrALAZINE (APRESOLINE) injection 10 mg (10 mg Intravenous Given 01/21/18 1827)  dextrose 5 %-0.45 % sodium chloride infusion (has no administration in time range)  insulin regular bolus via infusion 0-10 Units (has no administration in time range)  insulin regular (NOVOLIN R,HUMULIN R) 100 Units in sodium chloride 0.9 % 100 mL (1 Units/mL) infusion (has no administration in time range)  dextrose 50 % solution 25 mL (has no administration in time range)  0.45 % sodium chloride infusion (has no administration in time range)  lactated ringers bolus 1,000 mL (1,000 mLs Intravenous Given 01/21/18 1108)  vancomycin  (VANCOCIN) 2,000 mg in sodium chloride 0.9 % 500 mL IVPB (0 mg Intravenous Stopped 01/21/18 1649)  piperacillin-tazobactam (ZOSYN) IVPB 3.375 g (0 g Intravenous Stopped 01/21/18 1259)  fentaNYL (SUBLIMAZE) injection 50 mcg (50 mcg Intravenous Given 01/21/18 1234)  sodium chloride 0.9 % bolus 1,000 mL (1,000 mLs Intravenous Given 01/21/18 1300)  LORazepam (ATIVAN) injection 0.5 mg (0.5 mg Intravenous Given 01/21/18 1550)     NEW OUTPATIENT MEDICATIONS STARTED DURING THIS VISIT:  New Prescriptions   No medications on file    Note:  This note was prepared with assistance of Dragon voice recognition software. Occasional wrong-word or sound-a-like substitutions may have occurred due to the inherent limitations of voice recognition software.   Bernell Haynie, Barbara Cower, MD 01/21/18  1910  

## 2018-01-21 NOTE — Plan of Care (Signed)
  Problem: Education: Goal: Knowledge of General Education information will improve Outcome: Not Progressing   Pt is confused

## 2018-01-21 NOTE — Progress Notes (Signed)
eLink Physician-Brief Progress Note Patient Name: Tammy Huff DOB: 04/15/1941 MRN: [id number]   Date of Service  01/21/2018  HPI/Events of Note  [age] F with dementia adm with abd pain, N/V, AKI, agitated delirium  eICU Interventions  Remote eval performed and care plan reviewed     Intervention Category Evaluation Type: New Patient Evaluation  Merwyn KatosDavid B Simonds 01/21/2018, 9:11 PM

## 2018-01-21 NOTE — ED Notes (Signed)
Patient transported to X-ray 

## 2018-01-21 NOTE — H&P (Addendum)
History and Physical   Tammy Huff ZOX:[id number] DOB: 1941/09/08 DOA: 01/21/2018  Referring MD/NP/PA: Dr. Clayborne Dana, EDP PCP: Massie Maroon, FNP  Patient coming from: Home  Chief Complaint: Altered mental status  HPI: Tammy Huff is a 77 y.o. female with a history of advanced Alzheimer's dementia, diverticulosis, T2DM, stage III CKD, and HTN who was brought to the ED for multiple complaints including confusion. History is unobtainable from the patient due to dementia, so information is obtained from EDP, pt's daughter, and EMR. She initially seemed to have a poor appetite and had decreased po intake with gradually worsening NB NB vomiting associated with reports of abdominal pain. Her daughter noticed decreased urine output, increasing fatigue and weakness, and that the patient was no longer acting like herself. She is confused at baseline, but appeared worse than baseline. She has not been taking medications except trulicity yesterday. On arrival she appeared encephalopathic with elevated BP, afebrile, and hypoxic. Troponin elevated to 0.19 with new TWI's in inferolateral leads on ECG, though she denied chest pain. CXR demonstrated bibasilar effusions with atelectasis and stable cardiomegaly, seen again on CT abd/pelvis without culprit findings there. She had acute renal failure with creatinine 5.79 and hyperglycemia to 336 with elevated anion gap, mild acidosis, and ketonuria. Pyuria was also noted. She was anemic with hemoglobin 8.6 down from previously reported values of 11-14 with +FOBT concerning for GI bleed. 2L IVF given, and vanc/zosyn administered empirically. Hospitalists were consulted for admission, cardiology and GI consulted for possible NSTEMI and GI bleed.   Review of Systems: UTD due to dementia.   Past Medical History:  Diagnosis Date  . Arthritis   . Dementia    advanced  . Diabetes mellitus without complication (HCC)   . Diverticulitis   . Hypertension    Past  Surgical History:  Procedure Laterality Date  . COLON RESECTION     - Nonsmoker per report, lives at home with daughter with significant support.   reports that she has never smoked. She has never used smokeless tobacco. She reports that she does not drink alcohol or use drugs. No Known Allergies Family History  Family history unknown: Yes   - Family history otherwise reviewed and not pertinent.  Prior to Admission medications   Medication Sig Start Date End Date Taking? Authorizing Provider  amLODipine (NORVASC) 10 MG tablet Take 10 mg by mouth every evening.   Yes [provider]  aspirin EC 325 MG EC tablet Take 1 tablet (325 mg total) by mouth daily. 08/12/14  Yes Richarda Overlie, MD  carvedilol (COREG) 6.25 MG tablet Take 1 tablet (6.25 mg total) by mouth 2 (two) times daily with a meal. Reported on 12/26/2015 03/14/16  Yes Dione Booze, MD  cloNIDine (CATAPRES) 0.2 MG tablet Take 1 tablet (0.2 mg total) by mouth 2 (two) times daily. 03/14/16  Yes Dione Booze, MD  diphenhydrAMINE (BENADRYL) 25 MG tablet Take 25 mg by mouth daily as needed for allergies.    Yes [provider]  famotidine (PEPCID AC) 10 MG chewable tablet Chew 10 mg by mouth every morning.    Yes [provider]  hydrALAZINE (APRESOLINE) 25 MG tablet Take 25 mg by mouth 3 (three) times daily. 12/02/17  Yes [provider]  Iron-Vitamins (GERITOL PO) Take 1 tablet by mouth every morning.   Yes [provider]  losartan-hydrochlorothiazide (HYZAAR) 100-25 MG tablet Take 1 tablet by mouth daily. 03/14/16  Yes Dione Booze, MD  pravastatin (PRAVACHOL) 80 MG  tablet Take 1 tablet (80 mg total) by mouth every morning. 03/14/16  Yes Dione Booze, MD  TRULICITY 1.5 MG/0.5ML SOPN Inject 0.5 mLs as directed once a week. 01/06/18  Yes [provider]  insulin aspart (NOVOLOG FLEXPEN) 100 UNIT/ML FlexPen Inject 4 Units into the skin 3 (three) times daily with meals. 03/14/16   Dione Booze,  MD  Insulin Glargine (LANTUS SOLOSTAR) 100 UNIT/ML Solostar Pen Inject 40 Units into the skin daily at 10 pm. 03/14/16   Dione Booze, MD  Insulin Pen Needle (RELION PEN NEEDLE 31G/8MM) 31G X 8 MM MISC Use as directed. 03/14/16   Dione Booze, MD  ipratropium (ATROVENT) 0.03 % nasal spray Place 2 sprays into both nostrils every 12 (twelve) hours. Patient not taking: Reported on 12/26/2015 11/23/15 03/14/16  Alvira Monday, MD    Physical Exam: Vitals:   01/21/18 1215 01/21/18 1230 01/21/18 1300 01/21/18 1552  BP:  (!) 173/107 (!) 168/103 (!) 176/109  Pulse: 91 85 85 83  Resp: (!) 22  20 18   Temp:      TempSrc:      SpO2: 91% 95% (!) 88% 100%  Weight:      Height:       Constitutional: Elderly female in no distress, calm demeanor Eyes: Lids and conjunctivae normal, PERRL ENMT: Mucous membranes are moist. Posterior pharynx clear of any exudate or lesions.  Neck: Supple, no masses, no thyromegaly Respiratory: Non-labored breathing without accessory muscle use. No crackles, decreased at bases. Cardiovascular: Regular rate and rhythm, no murmurs, rubs, or gallops. No carotid bruits. No JVD. Trace pitting LE edema. Palpable pedal pulses. Abdomen: Normoactive bowel sounds. Soft, grimaces to deep palpation diffusely, nondistended and no masses palpated. No hepatosplenomegaly. GU: No indwelling catheter Musculoskeletal: No clubbing / cyanosis. No joint deformity upper and lower extremities.  Skin: Warm, dry. No rashes, wounds, or ulcers on exam limited by pt cooperation Neurologic: No focal deficits in motor strength or sensation in all extremities, though patient largely not cooperative with exam. Psychiatric: UTD.  Labs on Admission: I have personally reviewed following labs and imaging studies  CBC: Recent Labs  Lab 01/21/18 1111 01/21/18 1726  WBC 7.0  --   NEUTROABS 5.5  --   HGB 8.6* 8.2*  HCT 25.6* 23.9*  MCV 89.2  --   PLT 299  --    Basic Metabolic Panel: Recent Labs  Lab  01/21/18 1111  NA 137  K 4.5  CL 100*  CO2 21*  GLUCOSE 336*  BUN 57*  CREATININE 5.79*  CALCIUM 8.3*   GFR: Estimated Creatinine Clearance: 9.5 mL/min (A) (by C-G formula based on SCr of 5.79 mg/dL (H)). Liver Function Tests: Recent Labs  Lab 01/21/18 1111  AST 74*  ALT 49  ALKPHOS 111  BILITOT 1.8*  PROT 7.4  ALBUMIN 3.8   No results for input(s): LIPASE, AMYLASE in the last 168 hours. No results for input(s): AMMONIA in the last 168 hours. Coagulation Profile: No results for input(s): INR, PROTIME in the last 168 hours. Cardiac Enzymes: Recent Labs  Lab 01/21/18 1111  TROPONINI 0.19*   BNP (last 3 results) No results for input(s): PROBNP in the last 8760 hours. HbA1C: No results for input(s): HGBA1C in the last 72 hours. CBG: Recent Labs  Lab 01/21/18 0929  GLUCAP 319*   Lipid Profile: No results for input(s): CHOL, HDL, LDLCALC, TRIG, CHOLHDL, LDLDIRECT in the last 72 hours. Thyroid Function Tests: No results for input(s): TSH, T4TOTAL, FREET4, T3FREE, THYROIDAB in  the last 72 hours. Anemia Panel: Recent Labs    01/21/18 1111  VITAMINB12 1,605*  FOLATE 12.6  FERRITIN 210  TIBC 245*  IRON 41  RETICCTPCT 4.9*   Urine analysis:    Component Value Date/Time   COLORURINE YELLOW 01/21/2018 1252   APPEARANCEUR CLEAR 01/21/2018 1252   LABSPEC 1.012 01/21/2018 1252   PHURINE 5.0 01/21/2018 1252   GLUCOSEU >=500 (A) 01/21/2018 1252   HGBUR MODERATE (A) 01/21/2018 1252   BILIRUBINUR NEGATIVE 01/21/2018 1252   KETONESUR 5 (A) 01/21/2018 1252   PROTEINUR >=300 (A) 01/21/2018 1252   UROBILINOGEN 0.2 12/26/2015 1530   NITRITE NEGATIVE 01/21/2018 1252   LEUKOCYTESUR TRACE (A) 01/21/2018 1252    No results found for this or any previous visit (from the past 240 hour(s)).   Radiological Exams on Admission: Ct Abdomen Pelvis Wo Contrast  Result Date: 01/21/2018 CLINICAL DATA:  Hyperglycemia, generalize weakness and nausea EXAM: CT ABDOMEN AND PELVIS  WITHOUT CONTRAST TECHNIQUE: Multidetector CT imaging of the abdomen and pelvis was performed following the standard protocol without IV contrast. COMPARISON:  None. FINDINGS: Lower chest: Small bilateral pleural effusions with bibasilar atelectasis. Stable cardiomegaly. Bilateral interstitial prominence. Hepatobiliary: No focal liver abnormality is seen. No gallstones, gallbladder wall thickening, or biliary dilatation. Pancreas: Unremarkable. No pancreatic ductal dilatation or surrounding inflammatory changes. Spleen: Normal in size without focal abnormality. Adrenals/Urinary Tract: Adrenal glands are unremarkable. 18 mm hypodense, fluid attenuating left renal mass most consistent with a cyst. No urolithiasis or obstructive uropathy. Relatively decompressed bladder. Stomach/Bowel: Stomach is within normal limits. No evidence of bowel wall thickening, distention, or inflammatory changes. Vascular/Lymphatic: Abdominal aortic atherosclerosis. Normal caliber abdominal aorta. No lymphadenopathy. Reproductive: Uterus and bilateral adnexa are unremarkable. Other: No abdominal wall hernia or abnormality. Trace perihepatic ascites along the inferior margin. Musculoskeletal: No acute osseous abnormality. No aggressive osseous lesion. Degenerative disc disease disc height loss at T12-L1 and L2-3. IMPRESSION: 1. No acute abdominal or pelvic pathology. 2. Small bilateral pleural effusions and mild interstitial edema. Electronically Signed   By: Elige Ko   On: 01/21/2018 13:53   Dg Chest 2 View  Result Date: 01/21/2018 CLINICAL DATA:  Generalized weakness.  Shortness of breath. EXAM: CHEST - 2 VIEW COMPARISON:  Chest x-ray dated Mar 14, 2016. FINDINGS: Borderline cardiomegaly. Mild pulmonary vascular congestion. New small bilateral pleural effusions with bibasilar airspace opacities. No pneumothorax. No acute osseous abnormality. IMPRESSION: 1. New small bilateral pleural effusions with bibasilar atelectasis versus  infiltrates. Electronically Signed   By: Obie Dredge M.D.   On: 01/21/2018 10:46    EKG: Independently reviewed. NSR, vent rate 85, TWI's in II, III, aVF and V4-V6 not present on prior ECG.   Assessment/Plan Principal Problem:   Acute encephalopathy Active Problems:   DM hyperosmolarity type II, uncontrolled (HCC)   Essential hypertension   Dehydration   Renal failure, acute on chronic (HCC)   Dementia with behavioral disturbance   GI bleeding   Hypoxia   Acute lower UTI   DKA (diabetic ketoacidosis) (HCC)   NSTEMI (non-ST elevated myocardial infarction) (HCC)   Acute encephalopathy on chronic dementia with behavioral disturbance: Most likely metabolic due to acute renal failure, possible UTI, mild DKA, and NSTEMI.  - Monitor with treatments as below - Delirium precautions - No home meds for dementia  NSTEMI: Most likely type II. Mildly elevated troponin with new ECG changes.  - Cardiology, Dr. Sharyn Lull consulted by EDP and will see the patient.  - Trend troponin. Renal failure precludes cath  and GI bleeding precludes heparin, ASA.  - Check echocardiogram (last in 2017 showed diffuse hypokinesis, EF 45-50%, G1DD. - Beta blocker, statin, oxygen prn, NTG prn chest pain, fentanyl prn (avoid morphine with renal failure).  HTN with hypertensive urgency: In setting of not taking medications.  - Restart home medications including coreg, clonidine (avoid rebound HTN), and will provide hydralazine by IV with hold parameters. Would not want to precipitously drop BP, so aim for 25% reduction (~140/75), hold norvasc and titrate as needed.  Acute renal failure on stage III CKD: Due to multiple conditions and impaired autoregulation with diuretics/ARB. No obstruction or other significant renal abnormalities on CT abd.  - Hold ARB, diuretic - Check Urine Protein, Creatinine, Sodium - Monitor BMP and UOP with IV fluids. Insert foley to quantify accurately - Received 2L isotonic fluids, has  trace peripheral edema and imaging showed bibasilar pleural effusions with interstitial prominence and cardiomegaly so will decrease rate.   Mild DKA in T2DM: No longer on insulin due to recurrent hypoglycemia.  - Will start glucostabilizer as pt technically has mild DKA (ketonuria, mild acidosis, hyperglycemia with elevated anion gap).  - Update HbA1c  Normocytic anemia, presumed AOCD and blood loss anemia due to GI bleeding: Hgb 8.6 which is significantly lower than last values in [place] EMR (11.4 - 14.6) though these were 2017 and renal failure seems to have progressed since that time. Records from admission Jan 2018 state that hgb was 8.4 and stable at that time.  - Type & screen. - Monitor H&H  GI bleeding: with +FOBT and history of diverticulosis. EMR shows evidence of colon resection in 2000 for recurrent diverticular bleeds. No significant abdominal/pelvic pathology on CT.  - GI consulted for further recommendations, will see 3/27.  Hypoxia: Unclear cause, possibly due to NSTEMI. No history of COPD/wheezing on exam. Not persistently tachycardic, but PE is on differential. Not a candidate for CTA and V/Q scan would have limited yield.  - Monitor with other treatments   Pyuria, bacteruria: Unable to elicit symptoms - Send urine culture and treat empirically with ceftriaxone (received vanc/zosyn in ED)  DVT prophylaxis: SCDs  Code Status: Full   Family Communication: None at bedside, unsuccessfully attempted to call emergency contact. Daughter discussed w/EDP. Disposition Plan: Uncertain Consults called: Cardiology, Dr. Sharyn LullHarwani; [facility] who will see in AM  Admission status: Inpatient    Tammy Junkeryan Chayce Robbins, MD [facility] Pager [phone]  If 7PM-7AM, please contact night-coverage [email or web address] Password [email or web address] 01/21/2018, 6:08 PM

## 2018-01-21 NOTE — Consult Note (Signed)
Reason for Consult:Minimally elevated troponin I in the setting of acute anemia/renal failure Referring Physician:[facility] hospitalist  Tammy Huff is an 77 y.o. female.  HPI: patient is 77 year old female with past medical history significant for coronary artery disease history of MI and remote past, hypertension, diabetes mellitus, chronic kidney disease stage IV, history of diverticulitis, dementia, and degenerative joint disease,came to the ER by EMS because of generalized weakness associated with vague abdominal pain associated with nausea vomiting. Cardiologic consultation is called as patient was noted to have poor R-wave progression in anterior leads and minor ST-T wave changes in inferolateral leads and minimally elevated troponin I. Of note patient was also noted to have significant anemia with hemoglobin of 8.6 and creatinine of 5.79. Patient presently drowsy and sleepy all the history obtained from chart review and speaking to her daughter. As per daughter she was having cold and flulike symptoms for last few days associated with now nausea vomiting and generalized weakness,so decided to call EMS was noted to have very high blood sugar. Patient denies any chest pain nor shortness of breath.. Denies any palpitation or syncopal episode.denies any history of NSAIDs abuse.   Past Medical History:  Diagnosis Date  . Arthritis   . Dementia    advanced  . Diabetes mellitus without complication ([place])   . Diverticulitis   . Hypertension     Past Surgical History:  Procedure Laterality Date  . COLON RESECTION      Family History  Family history unknown: Yes    Social History:  reports that she has never smoked. She has never used smokeless tobacco. She reports that she does not drink alcohol or use drugs.  Allergies: No Known Allergies  Medications: I have reviewed the patient's current medications.  Results for orders placed or performed during the hospital encounter of 01/21/18  (from the past 48 hour(s))  CBG monitoring, ED     Status: Abnormal   Collection Time: 01/21/18  9:29 AM  Result Value Ref Range   Glucose-Capillary 319 (H) 65 - 99 mg/dL  CBC with Differential     Status: Abnormal   Collection Time: 01/21/18 11:11 AM  Result Value Ref Range   WBC 7.0 4.0 - 10.5 K/uL   RBC 2.87 (L) 3.87 - 5.11 MIL/uL   Hemoglobin 8.6 (L) 12.0 - 15.0 g/dL   HCT 25.6 (L) 36.0 - 46.0 %   MCV 89.2 78.0 - 100.0 fL   MCH 30.0 26.0 - 34.0 pg   MCHC 33.6 30.0 - 36.0 g/dL   RDW 14.2 11.5 - 15.5 %   Platelets 299 150 - 400 K/uL   Neutrophils Relative % 78 %   Neutro Abs 5.5 1.7 - 7.7 K/uL   Lymphocytes Relative 15 %   Lymphs Abs 1.0 0.7 - 4.0 K/uL   Monocytes Relative 7 %   Monocytes Absolute 0.5 0.1 - 1.0 K/uL   Eosinophils Relative 0 %   Eosinophils Absolute 0.0 0.0 - 0.7 K/uL   Basophils Relative 0 %   Basophils Absolute 0.0 0.0 - 0.1 K/uL    Comment: Performed at [facility], [place] [address], [place], [place] [address]  Comprehensive metabolic panel     Status: Abnormal   Collection Time: 01/21/18 11:11 AM  Result Value Ref Range   Sodium 137 135 - 145 mmol/L   Potassium 4.5 3.5 - 5.1 mmol/L   Chloride 100 (L) 101 - 111 mmol/L   CO2 21 (L) 22 - 32 mmol/L  Glucose, Bld 336 (H) 65 - 99 mg/dL   BUN 57 (H) 6 - 20 mg/dL   Creatinine, Ser 5.79 (H) 0.44 - 1.00 mg/dL   Calcium 8.3 (L) 8.9 - 10.3 mg/dL   Total Protein 7.4 6.5 - 8.1 g/dL   Albumin 3.8 3.5 - 5.0 g/dL   AST 74 (H) 15 - 41 U/L   ALT 49 14 - 54 U/L   Alkaline Phosphatase 111 38 - 126 U/L   Total Bilirubin 1.8 (H) 0.3 - 1.2 mg/dL   GFR calc non Af Amer 6 (L) >60 mL/min   GFR calc Af Amer 7 (L) >60 mL/min    Comment: (NOTE) The eGFR has been calculated using the CKD EPI equation. This calculation has not been validated in all clinical situations. eGFR's persistently <60 mL/min signify possible Chronic Kidney Disease.    Anion gap 16 (H) 5 - 15    Comment: Performed at [facility], [place] [address] [place], [place] [address]  Lactic acid, plasma     Status: Abnormal   Collection Time: 01/21/18 11:11 AM  Result Value Ref Range   Lactic Acid, Venous 2.5 (HH) 0.5 - 1.9 mmol/L    Comment: CRITICAL RESULT CALLED TO, READ BACK BY AND VERIFIED WITHBrett Fairy RN AT 1147 01/21/18 MULLINS,T Performed at [facility], [place] [address] [place], [place] [address]   Troponin I     Status: Abnormal   Collection Time: 01/21/18 11:11 AM  Result Value Ref Range   Troponin I 0.19 (HH) <0.03 ng/mL    Comment: CRITICAL RESULT CALLED TO, READ BACK BY AND VERIFIED WITHBrett Fairy RN AT 1147 01/21/18 MULLINS,T Performed at [facility], [place] [address] [place], [place] [address]   Vitamin B12     Status: Abnormal   Collection Time: 01/21/18 11:11 AM  Result Value Ref Range   Vitamin B-12 1,605 (H) 180 - 914 pg/mL    Comment: (NOTE) This assay is not validated for testing neonatal or myeloproliferative syndrome specimens for Vitamin B12 levels. Performed at [facility], [place] [address] [place], [place] [address]   Iron and TIBC     Status: Abnormal   Collection Time: 01/21/18 11:11 AM  Result Value Ref Range   Iron 41 28 - 170 ug/dL   TIBC 245 (L) 250 - 450 ug/dL   Saturation Ratios 17 10.4 - 31.8 %   UIBC 204 ug/dL    Comment: Performed at [facility], [place] [address] [place], [place] [address]  Ferritin     Status: None   Collection Time: 01/21/18 11:11 AM  Result Value Ref Range   Ferritin 210 11 - 307 ng/mL    Comment: Performed at [facility], [place] [address] [place], [place] [address]  Reticulocytes     Status: Abnormal   Collection Time: 01/21/18 11:11 AM  Result Value Ref Range   Retic Ct Pct 4.9 (H) 0.4 - 3.1 %   RBC. 2.89 (L) 3.87 - 5.11 MIL/uL   Retic Count, Absolute 141.6 19.0 - 186.0 K/uL    Comment: Performed at [facility], [place] [address], [place], [place]  [address]  Folate     Status: None   Collection Time: 01/21/18 11:11 AM  Result Value Ref Range   Folate 12.6 >5.9 ng/mL    Comment: Performed at [facility], [place] [address], [place],  [address]  Blood gas, venous  Status: Abnormal   Collection Time: 01/21/18 11:23 AM  Result Value Ref Range   pH, Ven 7.361 7.250 - 7.430   pCO2, Ven 39.4 (L) 44.0 - 60.0 mmHg   pO2, Ven  32.0 - 45.0 mmHg    CRITICAL RESULT CALLED TO, READ BACK BY AND VERIFIED WITH:    Comment: MADINA RN BY ROBIN POWELL RRT AT 1125 ON 01/21/18   Bicarbonate 21.7 20.0 - 28.0 mmol/L   Acid-base deficit 2.9 (H) 0.0 - 2.0 mmol/L   O2 Saturation 31.4 %   Patient temperature 98.6    Collection site DRAWN BY RN    Drawn by DRAWN BY RN    Sample type VENOUS     Comment: Performed at [facility], [place] [address] [place], [place] [address]  Urinalysis, Routine w reflex microscopic     Status: Abnormal   Collection Time: 01/21/18 12:52 PM  Result Value Ref Range   Color, Urine YELLOW YELLOW   APPearance CLEAR CLEAR   Specific Gravity, Urine 1.012 1.005 - 1.030   pH 5.0 5.0 - 8.0   Glucose, UA >=500 (A) NEGATIVE mg/dL   Hgb urine dipstick MODERATE (A) NEGATIVE   Bilirubin Urine NEGATIVE NEGATIVE   Ketones, ur 5 (A) NEGATIVE mg/dL   Protein, ur >=300 (A) NEGATIVE mg/dL   Nitrite NEGATIVE NEGATIVE   Leukocytes, UA TRACE (A) NEGATIVE   RBC / HPF 0-5 0 - 5 RBC/hpf   WBC, UA 6-30 0 - 5 WBC/hpf   Bacteria, UA RARE (A) NONE SEEN   Squamous Epithelial / LPF 0-5 (A) NONE SEEN   Mucus PRESENT    Hyaline Casts, UA PRESENT     Comment: Performed at [facility], [place] [address] [place], [place] [address]  POC occult blood, ED     Status: Abnormal   Collection Time: 01/21/18  2:41 PM  Result Value Ref Range   Fecal Occult Bld POSITIVE (A) NEGATIVE  Type and screen     Status: None   Collection Time: 01/21/18  3:25 PM  Result Value Ref Range   ABO/RH(D) O POS    Antibody  Screen NEG    Sample Expiration      01/24/2018 Performed at [facility], [place] [address] [place], [place] [address]   Lactic acid, plasma     Status: Abnormal   Collection Time: 01/21/18  3:30 PM  Result Value Ref Range   Lactic Acid, Venous 2.5 (HH) 0.5 - 1.9 mmol/L    Comment: CRITICAL RESULT CALLED TO, READ BACK BY AND VERIFIED WITH: WEST,S @ 1614 ON [id number] BY POTEAT,S Performed at [place] [address] [place], [place] [address]     Ct Abdomen Pelvis Wo Contrast  Result Date: 01/21/2018 CLINICAL DATA:  Hyperglycemia, generalize weakness and nausea EXAM: CT ABDOMEN AND PELVIS WITHOUT CONTRAST TECHNIQUE: Multidetector CT imaging of the abdomen and pelvis was performed following the standard protocol without IV contrast. COMPARISON:  None. FINDINGS: Lower chest: Small bilateral pleural effusions with bibasilar atelectasis. Stable cardiomegaly. Bilateral interstitial prominence. Hepatobiliary: No focal liver abnormality is seen. No gallstones, gallbladder wall thickening, or biliary dilatation. Pancreas: Unremarkable. No pancreatic ductal dilatation or surrounding inflammatory changes. Spleen: Normal in size without focal abnormality. Adrenals/Urinary Tract: Adrenal glands are unremarkable. 18 mm hypodense, fluid attenuating left renal mass most consistent with a cyst. No urolithiasis or obstructive uropathy. Relatively decompressed bladder. Stomach/Bowel: Stomach is within normal limits. No evidence of bowel wall thickening, distention,  or inflammatory changes. Vascular/Lymphatic: Abdominal aortic atherosclerosis. Normal caliber abdominal aorta. No lymphadenopathy. Reproductive: Uterus and bilateral adnexa are unremarkable. Other: No abdominal wall hernia or abnormality. Trace perihepatic ascites along the inferior margin. Musculoskeletal: No acute osseous abnormality. No aggressive osseous lesion. Degenerative disc disease disc height loss at T12-L1  and L2-3. IMPRESSION: 1. No acute abdominal or pelvic pathology. 2. Small bilateral pleural effusions and mild interstitial edema. Electronically Signed   By: Kathreen Devoid   On: 01/21/2018 13:53   Dg Chest 2 View  Result Date: 01/21/2018 CLINICAL DATA:  Generalized weakness.  Shortness of breath. EXAM: CHEST - 2 VIEW COMPARISON:  Chest x-ray dated Mar 14, 2016. FINDINGS: Borderline cardiomegaly. Mild pulmonary vascular congestion. New small bilateral pleural effusions with bibasilar airspace opacities. No pneumothorax. No acute osseous abnormality. IMPRESSION: 1. New small bilateral pleural effusions with bibasilar atelectasis versus infiltrates. Electronically Signed   By: Titus Dubin M.D.   On: 01/21/2018 10:46    Review of Systems  Constitutional: Negative for chills and fever.  Respiratory: Positive for cough.   Cardiovascular: Negative for chest pain and leg swelling.  Gastrointestinal: Positive for nausea and vomiting.  Neurological: Positive for dizziness.   Blood pressure (!) 176/109, pulse 83, temperature 97.6 F (36.4 C), temperature source Oral, resp. rate 18, height 5' 6"  (1.676 m), weight 98 kg (216 lb), SpO2 100 %. Physical Exam  Eyes: Conjunctivae are normal. Left eye exhibits no discharge. No scleral icterus.  Neck: Normal range of motion. Neck supple. No JVD present. No tracheal deviation present. No thyromegaly present.  Cardiovascular: Normal rate and regular rhythm.  Murmur (2/6 systolic murmur noted) heard. Respiratory:  Decreased breath sound at bases with bilateral rhonchi noted  GI: Soft. Bowel sounds are normal. She exhibits no distension. There is no tenderness.  Musculoskeletal: She exhibits no edema, tenderness or deformity.  Neurological:  Drowsy but moves all extremities    Assessment/Plan: Minimally elevated troponin I secondary to demand ischemia secondary to anemia/acute renal failure doubt significant MI Coronary artery disease history of MI in  remote past Uncontrolled hypertension Uncontrolled diabetes mellitus Probable bilateral aspiration pneumonia Acute renal failure Acute on chronic anemia rule out GI loss History of diverticulitis in the past Dementia Degenerative joint disease Plan Check serial enzymes and EKG Check 2-D echo to check LV systolic function and wall motion abnormalities Start metoprolol/nitrates/statins Hold aspirin/heparin in view of heme positive stools and acute anemia Further management per primary team  Charolette Forward 01/21/2018, 5:20 PM

## 2018-01-21 NOTE — ED Notes (Signed)
ED TO INPATIENT HANDOFF REPORT  Name/Age/Gender Tammy Huff 77 y.o. female  Code Status Code Status History    Date Active Date Inactive Code Status Order ID Comments User Context   04/25/2016 0757 04/27/2016 1758 Full Code [id number]  Waldemar Dickens, MD Inpatient   11/26/2014 1805 11/29/2014 1908 Full Code [id number]  Modena Jansky, MD Inpatient   08/12/2014 0245 08/12/2014 2213 Full Code [id number]  Etta Quill, DO ED      Home/SNF/Other Home  Chief Complaint hyperglycemia  Level of Care/Admitting Diagnosis ED Disposition    ED Disposition Condition [facility] Area: [place] [100102]  Level of Care: Stepdown [14]  Admit to SDU based on following criteria: Cardiac Instability:  Patients experiencing chest pain, unconfirmed MI and stable, arrhythmias and CHF requiring medical management and potentially compromising patient's stability  Admit to SDU based on following criteria: Severe physiological/psychological symptoms:  Any diagnosis requiring assessment & intervention at least every 4 hours on an ongoing basis to obtain desired patient outcomes including stability and rehabilitation  Diagnosis: Acute encephalopathy [323557]  Admitting Physician: Patrecia Pour [phone]  Attending Physician: Patrecia Pour [phone]  Estimated length of stay: 3 - 4 days  Certification:: I certify this patient will need inpatient services for at least 2 midnights  PT Class (Do Not Modify): Inpatient [101]  PT Acc Code (Do Not Modify): Private [1]       Medical History Past Medical History:  Diagnosis Date  . Arthritis   . Dementia    advanced  . Diabetes mellitus without complication ([place])   . Diverticulitis   . Hypertension     Allergies No Known Allergies  IV Location/Drains/Wounds Patient Lines/Drains/Airways Status   Active Line/Drains/Airways    Name:   Placement date:   Placement time:   Site:   Days:   Peripheral IV 01/21/18 Right  Antecubital   01/21/18    1522    Antecubital   less than 1          Labs/Imaging Results for orders placed or performed during the hospital encounter of 01/21/18 (from the past 48 hour(s))  CBG monitoring, ED     Status: Abnormal   Collection Time: 01/21/18  9:29 AM  Result Value Ref Range   Glucose-Capillary 319 (H) 65 - 99 mg/dL  CBC with Differential     Status: Abnormal   Collection Time: 01/21/18 11:11 AM  Result Value Ref Range   WBC 7.0 4.0 - 10.5 K/uL   RBC 2.87 (L) 3.87 - 5.11 MIL/uL   Hemoglobin 8.6 (L) 12.0 - 15.0 g/dL   HCT 25.6 (L) 36.0 - 46.0 %   MCV 89.2 78.0 - 100.0 fL   MCH 30.0 26.0 - 34.0 pg   MCHC 33.6 30.0 - 36.0 g/dL   RDW 14.2 11.5 - 15.5 %   Platelets 299 150 - 400 K/uL   Neutrophils Relative % 78 %   Neutro Abs 5.5 1.7 - 7.7 K/uL   Lymphocytes Relative 15 %   Lymphs Abs 1.0 0.7 - 4.0 K/uL   Monocytes Relative 7 %   Monocytes Absolute 0.5 0.1 - 1.0 K/uL   Eosinophils Relative 0 %   Eosinophils Absolute 0.0 0.0 - 0.7 K/uL   Basophils Relative 0 %   Basophils Absolute 0.0 0.0 - 0.1 K/uL    Comment: Performed at [facility], [place] [address], [place], [place] [address]  Comprehensive metabolic panel  Status: Abnormal   Collection Time: 01/21/18 11:11 AM  Result Value Ref Range   Sodium 137 135 - 145 mmol/L   Potassium 4.5 3.5 - 5.1 mmol/L   Chloride 100 (L) 101 - 111 mmol/L   CO2 21 (L) 22 - 32 mmol/L   Glucose, Bld 336 (H) 65 - 99 mg/dL   BUN 57 (H) 6 - 20 mg/dL   Creatinine, Ser 5.79 (H) 0.44 - 1.00 mg/dL   Calcium 8.3 (L) 8.9 - 10.3 mg/dL   Total Protein 7.4 6.5 - 8.1 g/dL   Albumin 3.8 3.5 - 5.0 g/dL   AST 74 (H) 15 - 41 U/L   ALT 49 14 - 54 U/L   Alkaline Phosphatase 111 38 - 126 U/L   Total Bilirubin 1.8 (H) 0.3 - 1.2 mg/dL   GFR calc non Af Amer 6 (L) >60 mL/min   GFR calc Af Amer 7 (L) >60 mL/min    Comment: (NOTE) The eGFR has been calculated using the CKD EPI equation. This calculation has not been validated  in all clinical situations. eGFR's persistently <60 mL/min signify possible Chronic Kidney Disease.    Anion gap 16 (H) 5 - 15    Comment: Performed at [facility], [place] [address] [place], [place] [address]  Lactic acid, plasma     Status: Abnormal   Collection Time: 01/21/18 11:11 AM  Result Value Ref Range   Lactic Acid, Venous 2.5 (HH) 0.5 - 1.9 mmol/L    Comment: CRITICAL RESULT CALLED TO, READ BACK BY AND VERIFIED WITHBrett Fairy RN AT 1147 01/21/18 MULLINS,T Performed at [facility], [place] [address], [place], [place] [address]   Troponin I     Status: Abnormal   Collection Time: 01/21/18 11:11 AM  Result Value Ref Range   Troponin I 0.19 (HH) <0.03 ng/mL    Comment: CRITICAL RESULT CALLED TO, READ BACK BY AND VERIFIED WITHBrett Fairy RN AT 1147 01/21/18 MULLINS,T Performed at [facility], [place] [address], [place], [place] [address]   Vitamin B12     Status: Abnormal   Collection Time: 01/21/18 11:11 AM  Result Value Ref Range   Vitamin B-12 1,605 (H) 180 - 914 pg/mL    Comment: (NOTE) This assay is not validated for testing neonatal or myeloproliferative syndrome specimens for Vitamin B12 levels. Performed at [facility], [place] [address], [place], [place] [address]   Iron and TIBC     Status: Abnormal   Collection Time: 01/21/18 11:11 AM  Result Value Ref Range   Iron 41 28 - 170 ug/dL   TIBC 245 (L) 250 - 450 ug/dL   Saturation Ratios 17 10.4 - 31.8 %   UIBC 204 ug/dL    Comment: Performed at [facility], [place] [address], [place], [place] [address]  Ferritin     Status: None   Collection Time: 01/21/18 11:11 AM  Result Value Ref Range   Ferritin 210 11 - 307 ng/mL    Comment: Performed at [facility], [place] [address], [place], [place] [address]  Reticulocytes     Status: Abnormal   Collection Time: 01/21/18 11:11 AM  Result Value Ref Range   Retic Ct Pct 4.9 (H) 0.4 - 3.1 %   RBC. 2.89  (L) 3.87 - 5.11 MIL/uL   Retic Count, Absolute 141.6 19.0 - 186.0 K/uL    Comment: Performed at [facility], [place] [address] [place], [place]  [address]  Folate     Status: None   Collection Time: 01/21/18 11:11 AM  Result Value Ref Range   Folate 12.6 >5.9 ng/mL    Comment: Performed at [facility], [place] [address], [place], [place] [address]  Blood gas, venous     Status: Abnormal   Collection Time: 01/21/18 11:23 AM  Result Value Ref Range   pH, Ven 7.361 7.250 - 7.430   pCO2, Ven 39.4 (L) 44.0 - 60.0 mmHg   pO2, Ven  32.0 - 45.0 mmHg    CRITICAL RESULT CALLED TO, READ BACK BY AND VERIFIED WITH:    Comment: Kendrik Mcshan RN BY ROBIN POWELL RRT AT 1125 ON 01/21/18   Bicarbonate 21.7 20.0 - 28.0 mmol/L   Acid-base deficit 2.9 (H) 0.0 - 2.0 mmol/L   O2 Saturation 31.4 %   Patient temperature 98.6    Collection site DRAWN BY RN    Drawn by DRAWN BY RN    Sample type VENOUS     Comment: Performed at [facility], [place] [address] [place], [place] [address]  Urinalysis, Routine w reflex microscopic     Status: Abnormal   Collection Time: 01/21/18 12:52 PM  Result Value Ref Range   Color, Urine YELLOW YELLOW   APPearance CLEAR CLEAR   Specific Gravity, Urine 1.012 1.005 - 1.030   pH 5.0 5.0 - 8.0   Glucose, UA >=500 (A) NEGATIVE mg/dL   Hgb urine dipstick MODERATE (A) NEGATIVE   Bilirubin Urine NEGATIVE NEGATIVE   Ketones, ur 5 (A) NEGATIVE mg/dL   Protein, ur >=300 (A) NEGATIVE mg/dL   Nitrite NEGATIVE NEGATIVE   Leukocytes, UA TRACE (A) NEGATIVE   RBC / HPF 0-5 0 - 5 RBC/hpf   WBC, UA 6-30 0 - 5 WBC/hpf   Bacteria, UA RARE (A) NONE SEEN   Squamous Epithelial / LPF 0-5 (A) NONE SEEN   Mucus PRESENT    Hyaline Casts, UA PRESENT     Comment: Performed at [facility], [place] [address] [place], [place] [address]  POC occult blood, ED     Status: Abnormal   Collection Time: 01/21/18  2:41 PM  Result Value Ref Range   Fecal  Occult Bld POSITIVE (A) NEGATIVE  Type and screen     Status: None   Collection Time: 01/21/18  3:25 PM  Result Value Ref Range   ABO/RH(D) O POS    Antibody Screen NEG    Sample Expiration      01/24/2018 Performed at [facility], [place] [address] [place], [place] [address]   Lactic acid, plasma     Status: Abnormal   Collection Time: 01/21/18  3:30 PM  Result Value Ref Range   Lactic Acid, Venous 2.5 (HH) 0.5 - 1.9 mmol/L    Comment: CRITICAL RESULT CALLED TO, READ BACK BY AND VERIFIED WITH: WEST,S @ 1614 ON [id number] BY POTEAT,S Performed at [place] [address], [place], [place] [address]   Troponin I (q 6hr x 3)     Status: Abnormal   Collection Time: 01/21/18  5:26 PM  Result Value Ref Range   Troponin I 0.21 (HH) <0.03 ng/mL    Comment: CRITICAL VALUE NOTED.  VALUE IS CONSISTENT WITH PREVIOUSLY REPORTED AND CALLED VALUE. Performed at [facility], [place] [address], [place], [place] [address]   Hemoglobin and hematocrit, blood     Status: Abnormal   Collection Time: 01/21/18  5:26  PM  Result Value Ref Range   Hemoglobin 8.2 (L) 12.0 - 15.0 g/dL   HCT 23.9 (L) 36.0 - 46.0 %    Comment: Performed at [facility], [place] [address], [place], [place] [address]  CBG monitoring, ED     Status: Abnormal   Collection Time: 01/21/18  6:48 PM  Result Value Ref Range   Glucose-Capillary 276 (H) 65 - 99 mg/dL   Ct Abdomen Pelvis Wo Contrast  Result Date: 01/21/2018 CLINICAL DATA:  Hyperglycemia, generalize weakness and nausea EXAM: CT ABDOMEN AND PELVIS WITHOUT CONTRAST TECHNIQUE: Multidetector CT imaging of the abdomen and pelvis was performed following the standard protocol without IV contrast. COMPARISON:  None. FINDINGS: Lower chest: Small bilateral pleural effusions with bibasilar atelectasis. Stable cardiomegaly. Bilateral interstitial prominence. Hepatobiliary: No focal liver abnormality is seen. No  gallstones, gallbladder wall thickening, or biliary dilatation. Pancreas: Unremarkable. No pancreatic ductal dilatation or surrounding inflammatory changes. Spleen: Normal in size without focal abnormality. Adrenals/Urinary Tract: Adrenal glands are unremarkable. 18 mm hypodense, fluid attenuating left renal mass most consistent with a cyst. No urolithiasis or obstructive uropathy. Relatively decompressed bladder. Stomach/Bowel: Stomach is within normal limits. No evidence of bowel wall thickening, distention, or inflammatory changes. Vascular/Lymphatic: Abdominal aortic atherosclerosis. Normal caliber abdominal aorta. No lymphadenopathy. Reproductive: Uterus and bilateral adnexa are unremarkable. Other: No abdominal wall hernia or abnormality. Trace perihepatic ascites along the inferior margin. Musculoskeletal: No acute osseous abnormality. No aggressive osseous lesion. Degenerative disc disease disc height loss at T12-L1 and L2-3. IMPRESSION: 1. No acute abdominal or pelvic pathology. 2. Small bilateral pleural effusions and mild interstitial edema. Electronically Signed   By: Kathreen Devoid   On: 01/21/2018 13:53   Dg Chest 2 View  Result Date: 01/21/2018 CLINICAL DATA:  Generalized weakness.  Shortness of breath. EXAM: CHEST - 2 VIEW COMPARISON:  Chest x-ray dated Mar 14, 2016. FINDINGS: Borderline cardiomegaly. Mild pulmonary vascular congestion. New small bilateral pleural effusions with bibasilar airspace opacities. No pneumothorax. No acute osseous abnormality. IMPRESSION: 1. New small bilateral pleural effusions with bibasilar atelectasis versus infiltrates. Electronically Signed   By: Titus Dubin M.D.   On: 01/21/2018 10:46    Pending Labs Unresulted Labs (From admission, onward)   Start     Ordered   01/22/18 0500  CBC  Tomorrow morning,   R     01/21/18 1749   01/22/18 0000  Hemoglobin and hematocrit, blood  Once-Timed,   R     01/21/18 1750   01/21/18 1750  Culture, Urine  Once,   R      01/21/18 1749   01/21/18 1750  Protein / creatinine ratio, urine  Once,   R     01/21/18 1749   01/21/18 1750  Sodium, urine, random  Once,   R     01/21/18 1749   01/21/18 1750  Hemoglobin A1c  Once,   R     01/21/18 1749   01/21/18 1750  Protime-INR  Once,   R     01/21/18 1749   01/21/18 1744  Troponin I (q 6hr x 3)  Now then every 6 hours,   R     01/21/18 1744   01/21/18 1525  ABO/Rh  Once,   R     01/21/18 1525   01/21/18 1127  Occult blood card to lab, stool RN will collect  Once,   STAT    Question:  Specimen to be collected by?  Answer:  RN will collect  01/21/18 1126   Signed and Held  Basic metabolic panel  Tomorrow morning,   R     Signed and Held      Vitals/Pain Today's Vitals   01/21/18 1300 01/21/18 1434 01/21/18 1552 01/21/18 1931  BP: (!) 168/103  (!) 176/109 (!) 193/102  Pulse: 85  83 (!) 102  Resp: 20  18 (!) 22  Temp:      TempSrc:      SpO2: (!) 88%  100% 95%  Weight:      Height:      PainSc:  4       Isolation Precautions No active isolations  Medications Medications  cefTRIAXone (ROCEPHIN) 1 g in sodium chloride 0.9 % 100 mL IVPB (0 g Intravenous Stopped 01/21/18 1932)  cloNIDine (CATAPRES) tablet 0.2 mg (has no administration in time range)  carvedilol (COREG) tablet 6.25 mg (has no administration in time range)  hydrALAZINE (APRESOLINE) injection 10 mg (10 mg Intravenous Given 01/21/18 1827)  dextrose 5 %-0.45 % sodium chloride infusion (has no administration in time range)  insulin regular bolus via infusion 0-10 Units (has no administration in time range)  insulin regular (NOVOLIN R,HUMULIN R) 100 Units in sodium chloride 0.9 % 100 mL (1 Units/mL) infusion (2.2 Units/hr Intravenous New Bag/Given 01/21/18 1918)  dextrose 50 % solution 25 mL (has no administration in time range)  0.45 % sodium chloride infusion (has no administration in time range)  lactated ringers bolus 1,000 mL (1,000 mLs Intravenous Given 01/21/18 1108)  vancomycin  (VANCOCIN) 2,000 mg in sodium chloride 0.9 % 500 mL IVPB (0 mg Intravenous Stopped 01/21/18 1649)  piperacillin-tazobactam (ZOSYN) IVPB 3.375 g (0 g Intravenous Stopped 01/21/18 1259)  fentaNYL (SUBLIMAZE) injection 50 mcg (50 mcg Intravenous Given 01/21/18 1234)  sodium chloride 0.9 % bolus 1,000 mL (1,000 mLs Intravenous Given 01/21/18 1300)  LORazepam (ATIVAN) injection 0.5 mg (0.5 mg Intravenous Given 01/21/18 1550)    Mobility walks with device

## 2018-01-21 NOTE — ED Notes (Signed)
Bed: WA06 Expected date:  Expected time:  Means of arrival:  Comments: EMS- hyperglycemia 

## 2018-01-21 NOTE — ED Triage Notes (Signed)
Patient coming from home with c/o hyperglycemia blood sugar per ems 422. Pt also been c/o of generalized weakness and nausea/vomiting.

## 2018-01-22 ENCOUNTER — Inpatient Hospital Stay (HOSPITAL_COMMUNITY): Payer: Medicare Other

## 2018-01-22 DIAGNOSIS — G9340 Encephalopathy, unspecified: Secondary | ICD-10-CM

## 2018-01-22 DIAGNOSIS — R195 Other fecal abnormalities: Secondary | ICD-10-CM

## 2018-01-22 DIAGNOSIS — Z515 Encounter for palliative care: Secondary | ICD-10-CM

## 2018-01-22 DIAGNOSIS — Z7189 Other specified counseling: Secondary | ICD-10-CM

## 2018-01-22 DIAGNOSIS — I5030 Unspecified diastolic (congestive) heart failure: Secondary | ICD-10-CM

## 2018-01-22 DIAGNOSIS — N189 Chronic kidney disease, unspecified: Secondary | ICD-10-CM

## 2018-01-22 DIAGNOSIS — D649 Anemia, unspecified: Secondary | ICD-10-CM

## 2018-01-22 LAB — BASIC METABOLIC PANEL
ANION GAP: 16 — AB (ref 5–15)
Anion gap: 16 — ABNORMAL HIGH (ref 5–15)
BUN: 57 mg/dL — ABNORMAL HIGH (ref 6–20)
BUN: 57 mg/dL — AB (ref 6–20)
CALCIUM: 8 mg/dL — AB (ref 8.9–10.3)
CO2: 18 mmol/L — ABNORMAL LOW (ref 22–32)
CO2: 18 mmol/L — AB (ref 22–32)
CREATININE: 5.62 mg/dL — AB (ref 0.44–1.00)
Calcium: 8.1 mg/dL — ABNORMAL LOW (ref 8.9–10.3)
Chloride: 107 mmol/L (ref 101–111)
Chloride: 105 mmol/L (ref 101–111)
Creatinine, Ser: 5.62 mg/dL — ABNORMAL HIGH (ref 0.44–1.00)
GFR calc Af Amer: 8 mL/min — ABNORMAL LOW (ref >60)
GFR, EST AFRICAN AMERICAN: 8 mL/min — AB (ref >60)
GFR, EST NON AFRICAN AMERICAN: 7 mL/min — AB (ref >60)
GFR, EST NON AFRICAN AMERICAN: 7 mL/min — AB (ref >60)
Glucose, Bld: 78 mg/dL (ref 65–99)
Glucose, Bld: 197 mg/dL — ABNORMAL HIGH (ref 65–99)
POTASSIUM: 3.4 mmol/L — AB (ref 3.5–5.1)
Potassium: 3.9 mmol/L (ref 3.5–5.1)
SODIUM: 141 mmol/L (ref 135–145)
Sodium: 139 mmol/L (ref 135–145)

## 2018-01-22 LAB — GLUCOSE, CAPILLARY
GLUCOSE-CAPILLARY: 137 mg/dL — AB (ref 65–99)
GLUCOSE-CAPILLARY: 151 mg/dL — AB (ref 65–99)
GLUCOSE-CAPILLARY: 136 mg/dL — AB (ref 65–99)
GLUCOSE-CAPILLARY: 91 mg/dL (ref 65–99)
GLUCOSE-CAPILLARY: 48 mg/dL — AB (ref 65–99)
GLUCOSE-CAPILLARY: 99 mg/dL (ref 65–99)
GLUCOSE-CAPILLARY: 56 mg/dL — AB (ref 65–99)
GLUCOSE-CAPILLARY: 77 mg/dL (ref 65–99)
Glucose-Capillary: 105 mg/dL — ABNORMAL HIGH (ref 65–99)
Glucose-Capillary: 114 mg/dL — ABNORMAL HIGH (ref 65–99)
Glucose-Capillary: 120 mg/dL — ABNORMAL HIGH (ref 65–99)
Glucose-Capillary: 156 mg/dL — ABNORMAL HIGH (ref 65–99)
Glucose-Capillary: 179 mg/dL — ABNORMAL HIGH (ref 65–99)
Glucose-Capillary: 188 mg/dL — ABNORMAL HIGH (ref 65–99)
Glucose-Capillary: 199 mg/dL — ABNORMAL HIGH (ref 65–99)
Glucose-Capillary: 68 mg/dL (ref 65–99)
Glucose-Capillary: 92 mg/dL (ref 65–99)
Glucose-Capillary: 98 mg/dL (ref 65–99)

## 2018-01-22 LAB — HEMOGLOBIN A1C
Hgb A1c MFr Bld: 6.7 % — ABNORMAL HIGH (ref 4.8–5.6)
MEAN PLASMA GLUCOSE: 145.59 mg/dL

## 2018-01-22 LAB — TROPONIN I
Troponin I: 0.19 ng/mL (ref <0.03)
Troponin I: 0.19 ng/mL (ref <0.03)

## 2018-01-22 LAB — CBC
HCT: 25 % — ABNORMAL LOW (ref 36.0–46.0)
Hemoglobin: 8.6 g/dL — ABNORMAL LOW (ref 12.0–15.0)
MCH: 30.6 pg (ref 26.0–34.0)
MCHC: 34.4 g/dL (ref 30.0–36.0)
MCV: 89 fL (ref 78.0–100.0)
PLATELETS: 226 10*3/uL (ref 150–400)
RBC: 2.81 MIL/uL — ABNORMAL LOW (ref 3.87–5.11)
RDW: 14.8 % (ref 11.5–15.5)
WBC: 8.6 10*3/uL (ref 4.0–10.5)

## 2018-01-22 LAB — ECHOCARDIOGRAM COMPLETE
HEIGHTINCHES: 66 in
Weight: 2430.35 oz

## 2018-01-22 LAB — BRAIN NATRIURETIC PEPTIDE

## 2018-01-22 MED ORDER — FUROSEMIDE 10 MG/ML IJ SOLN
80.0000 mg | Freq: Two times a day (BID) | INTRAMUSCULAR | Status: DC
  Administered 2018-01-23: 80 mg via INTRAVENOUS
  Filled 2018-01-22: qty 8

## 2018-01-22 MED ORDER — INSULIN ASPART 100 UNIT/ML ~~LOC~~ SOLN: 0.0000 [IU] | Freq: Three times a day (TID) | SUBCUTANEOUS | Status: DC

## 2018-01-22 MED ORDER — INSULIN GLARGINE 100 UNIT/ML ~~LOC~~ SOLN
20.0000 [IU] | SUBCUTANEOUS | Status: DC
  Administered 2018-01-22: 20 [IU] via SUBCUTANEOUS
  Filled 2018-01-22: qty 0.2

## 2018-01-22 MED ORDER — INSULIN ASPART 100 UNIT/ML ~~LOC~~ SOLN: 0.0000 [IU] | SUBCUTANEOUS | Status: DC

## 2018-01-22 MED ORDER — DEXTROSE 10 % IV SOLN
INTRAVENOUS | Status: DC
  Administered 2018-01-22: 05:00:00 via INTRAVENOUS
  Filled 2018-01-22: qty 1000

## 2018-01-22 MED ORDER — ONDANSETRON HCL 4 MG/2ML IJ SOLN
4.0000 mg | Freq: Four times a day (QID) | INTRAMUSCULAR | Status: DC | PRN
  Administered 2018-01-22 – 2018-01-26 (×3): 4 mg via INTRAVENOUS
  Filled 2018-01-22 (×3): qty 2

## 2018-01-22 MED ORDER — FUROSEMIDE 10 MG/ML IJ SOLN
80.0000 mg | Freq: Every day | INTRAMUSCULAR | Status: DC
  Administered 2018-01-22: 80 mg via INTRAVENOUS
  Filled 2018-01-22: qty 8

## 2018-01-22 MED ORDER — INSULIN ASPART 100 UNIT/ML ~~LOC~~ SOLN
0.0000 [IU] | SUBCUTANEOUS | Status: DC
  Administered 2018-01-23 – 2018-01-25 (×5): 2 [IU] via SUBCUTANEOUS
  Administered 2018-01-25 – 2018-01-26 (×3): 3 [IU] via SUBCUTANEOUS
  Administered 2018-01-26 (×2): 1 [IU] via SUBCUTANEOUS
  Administered 2018-01-26: 3 [IU] via SUBCUTANEOUS

## 2018-01-22 MED ORDER — PANTOPRAZOLE SODIUM 40 MG IV SOLR
40.0000 mg | INTRAVENOUS | Status: DC
  Administered 2018-01-22 – 2018-01-23 (×2): 40 mg via INTRAVENOUS
  Filled 2018-01-22 (×2): qty 40

## 2018-01-22 MED ORDER — OXYCODONE HCL 5 MG PO TABS
5.0000 mg | ORAL_TABLET | Freq: Four times a day (QID) | ORAL | Status: DC | PRN
  Filled 2018-01-22: qty 1

## 2018-01-22 MED ORDER — DEXTROSE 10 % IV SOLN
INTRAVENOUS | Status: DC
  Administered 2018-01-23: via INTRAVENOUS
  Filled 2018-01-22: qty 1000

## 2018-01-22 NOTE — Progress Notes (Signed)
  Echocardiogram 2D Echocardiogram has been performed.  Tammy HarderWest, Tammy Huff 01/22/2018, 2:08 PM

## 2018-01-22 NOTE — Progress Notes (Addendum)
Patient has elevated troponin and t-wave inversion in lateral leads in EKG which is present since admission. Echo with low EF. Increased lasix to bid 80 mg iv.  -not on AC because of concern of bleed. -cardiologist following. I have discussed with Dr. Sharyn LullHarwani from Cardiology. -continue current cardiac medications

## 2018-01-22 NOTE — Care Management Note (Signed)
Case Management Note  Patient Details  Name: Lyda KalataGracie C Ansell MRN: [id number] Date of Birth: 09/29/1941  Subjective/Objective:   [facility] cons-await recc.                 Action/Plan:d/c home w/HHC.   Expected Discharge Date:  (unknown)               Expected Discharge Plan:  Home w Home Health Services  In-House Referral:     Discharge planning Services  CM Consult  Post Acute Care Choice:    Choice offered to:     DME Arranged:    DME Agency:     HH Arranged:    HH Agency:     Status of Service:  In process, will continue to follow  If discussed at Long Length of Stay Meetings, dates discussed:    Additional Comments:  Lanier ClamMahabir, Khrista Braun, RN 01/22/2018, 12:43 PM

## 2018-01-22 NOTE — Consult Note (Addendum)
Consultation  Referring Provider:  Dr. Jarvis Newcomer    Primary Care Physician:  Massie Maroon, FNP Primary Gastroenterologist: Dr. Leone Payor        Reason for Consultation:  Anemia, FOBT + stool, Nausea            HPI:   Tammy Huff is a 77 y.o. female with advanced dementia, diverticulosis, diabetes type 2, stage III CKD and hypertension, who was brought to the ER for multiple complaints including confusion.  We were consulted today in regards to a finding of anemia with a four-point hemoglobin drop and fecal occult positive stool.  History is unobtainable from the patient due to dementia.  Information is gathered from previous physician's notes.     According to previous physician's and with help from patient's daughter at time of arrrival, patient reportedly had a poor appetite with decreased p.o. intake with gradual worsening of nausea and vomiting associated with reports of abdominal pain.  Her daughter noticed decreased urine output, increasing fatigue and weakness and the patient was no longer acting like herself.  Apparently she is confused at baseline but this is worse than baseline.  Has not been taking medications except Trulicity.    Per nursing this morning, patient has had no bowel movements and no signs of acute GI bleed.  She has not changed as far as mental status since arriving on the floor.  Apparently her daughter is to arrive later this morning.  ED course: Appeared encephalopathic with elevated BP, afebrile and hypoxic, troponin elevated to 0.19 with EKG changes, denied chest pain, chest x-ray demonstrated bibasilar effusions with atelectasis and stable cardiomegaly, CT abdomen pelvis without abnormality, acute renal failure with creatinine 5.79 and hyperglycemia to 336 with elevated anion gap, mild acidosis and ketonuria, pyuria also noted, anemic with hemoglobin 8.6 down from previously reported values of 11-14 with positive FOBT concerning for GI bleed, 2 L IVF given and  bank/sedation administered empirically  GI history: 01/04/10-flex sigmoidoscopy, Dr. Leone Payor: Postop change, status post subtotal colectomy with normal side to ileo-sigmoid anastomosis, diverticulum in the sigmoid colon, internal hemorrhoids in the rectum, otherwise normal exam  Past Medical History:  Diagnosis Date  . Arthritis   . Dementia    advanced  . Diabetes mellitus without complication (HCC)   . Diverticulitis   . Hypertension     Past Surgical History:  Procedure Laterality Date  . COLON RESECTION      Family History  Family history unknown: Yes     Social History   Tobacco Use  . Smoking status: Never Smoker  . Smokeless tobacco: Never Used  Substance Use Topics  . Alcohol use: No  . Drug use: Never    Prior to Admission medications   Medication Sig Start Date End Date Taking? Authorizing Provider  amLODipine (NORVASC) 10 MG tablet Take 10 mg by mouth every evening.   Yes [provider]  aspirin EC 325 MG EC tablet Take 1 tablet (325 mg total) by mouth daily. 08/12/14  Yes Richarda Overlie, MD  carvedilol (COREG) 6.25 MG tablet Take 1 tablet (6.25 mg total) by mouth 2 (two) times daily with a meal. Reported on 12/26/2015 03/14/16  Yes Dione Booze, MD  cloNIDine (CATAPRES) 0.2 MG tablet Take 1 tablet (0.2 mg total) by mouth 2 (two) times daily. 03/14/16  Yes Dione Booze, MD  diphenhydrAMINE (BENADRYL) 25 MG tablet Take 25 mg by mouth daily as needed for allergies.    Yes [provider]  famotidine (PEPCID AC) 10 MG chewable tablet Chew 10 mg by mouth every morning.    Yes [provider]  hydrALAZINE (APRESOLINE) 25 MG tablet Take 25 mg by mouth 3 (three) times daily. 12/02/17  Yes [provider]  Iron-Vitamins (GERITOL PO) Take 1 tablet by mouth every morning.   Yes [provider]  losartan-hydrochlorothiazide (HYZAAR) 100-25 MG tablet Take 1 tablet by mouth daily. 03/14/16  Yes Dione BoozeGlick, David, MD  pravastatin (PRAVACHOL)  80 MG tablet Take 1 tablet (80 mg total) by mouth every morning. 03/14/16  Yes Dione BoozeGlick, David, MD  TRULICITY 1.5 MG/0.5ML SOPN Inject 0.5 mLs as directed once a week. 01/06/18  Yes [provider]  insulin aspart (NOVOLOG FLEXPEN) 100 UNIT/ML FlexPen Inject 4 Units into the skin 3 (three) times daily with meals. 03/14/16   Dione BoozeGlick, David, MD  Insulin Glargine (LANTUS SOLOSTAR) 100 UNIT/ML Solostar Pen Inject 40 Units into the skin daily at 10 pm. 03/14/16   Dione BoozeGlick, David, MD  Insulin Pen Needle (RELION PEN NEEDLE 31G/8MM) 31G X 8 MM MISC Use as directed. 03/14/16   Dione BoozeGlick, David, MD  ipratropium (ATROVENT) 0.03 % nasal spray Place 2 sprays into both nostrils every 12 (twelve) hours. Patient not taking: Reported on 12/26/2015 11/23/15 03/14/16  Alvira MondaySchlossman, Erin, MD    Current Facility-Administered Medications  Medication Dose Route Frequency Provider Last Rate Last Dose  . carvedilol (COREG) tablet 6.25 mg  6.25 mg Oral BID WC Hazeline JunkerGrunz, Ryan B, MD      . cefTRIAXone (ROCEPHIN) 1 g in sodium chloride 0.9 % 100 mL IVPB  1 g Intravenous Q24H Tyrone NineGrunz, Ryan B, MD   Stopped at 01/21/18 1932  . cloNIDine (CATAPRES) tablet 0.2 mg  0.2 mg Oral BID Hazeline JunkerGrunz, Ryan B, MD      . dextrose 10 % infusion   Intravenous Continuous Leda GauzeKirby-Graham, Karen J, NP 50 mL/hr at 01/22/18 0526    . dextrose 50 % solution 25 mL  25 mL Intravenous PRN Tyrone NineGrunz, Ryan B, MD   13 mL at 01/22/18 0410  . famotidine (PEPCID) tablet 10 mg  10 mg Oral q morning - 10a Tyrone NineGrunz, Ryan B, MD      . fentaNYL (SUBLIMAZE) injection 25 mcg  25 mcg Intravenous Q2H PRN Tyrone NineGrunz, Ryan B, MD      . hydrALAZINE (APRESOLINE) injection 10 mg  10 mg Intravenous Q4H Tyrone NineGrunz, Ryan B, MD   10 mg at 01/22/18 0523  . insulin regular (NOVOLIN R,HUMULIN R) 100 Units in sodium chloride 0.9 % 100 mL (1 Units/mL) infusion   Intravenous Continuous Hazeline JunkerGrunz, Ryan B, MD 0.5 mL/hr at 01/22/18 0749 0.5 Units/hr at 01/22/18 0749  . insulin regular bolus via infusion 0-10 Units  0-10 Units  Intravenous TID WC Tyrone NineGrunz, Ryan B, MD      . nitroGLYCERIN (NITROSTAT) SL tablet 0.4 mg  0.4 mg Sublingual Q5 min PRN Tyrone NineGrunz, Ryan B, MD      . pravastatin (PRAVACHOL) tablet 80 mg  80 mg Oral Lazaro ArmsBH-[id number] Grunz, Ryan B, MD      . sodium chloride flush (NS) 0.9 % injection 3 mL  3 mL Intravenous Q12H Tyrone NineGrunz, Ryan B, MD        Allergies as of 01/21/2018  . (No Known Allergies)     Review of Systems:    Unable to obtain due to dementia   Physical Exam:  Vital signs in last 24 hours: Temp:  [96.3 F (35.7 C)-97.6 F (36.4 C)] 97.5 F (36.4  C) (03/27 0800) Pulse Rate:  [57-102] 86 (03/27 0800) Resp:  [0-42] 0 (03/27 0800) BP: (82-193)/(64-145) 168/88 (03/27 0800) SpO2:  [88 %-100 %] 97 % (03/27 0800) Weight:  [151 lb 0.2 oz (68.5 kg)-216 lb (98 kg)] 151 lb 14.4 oz (68.9 kg) (03/27 0500) Last BM Date: 01/21/18 General:  Demented elderly female appears to be in NAD, well developed, grunting and moaning Head:  Normocephalic and atraumatic. Eyes:   PEERL, EOMI. No icterus. Conjunctiva pink. Ears:  Normal auditory acuity. Neck:  Supple Throat: Oral cavity and pharynx without inflammation, swelling or lesion. Teeth in good condition. Lungs: Respirations even and unlabored. Lungs clear to auscultation bilaterally.   No wheezes, crackles, or rhonchi. Decreased at bases Heart: Normal S1, S2. No MRG. Regular rate and rhythm. No peripheral edema, cyanosis or pallor.  Abdomen:  Soft, nondistended, Grimaces with deep palpation diffusely, No rebound or guarding. Normal bowel sounds. No appreciable masses or hepatomegaly. Limited due to patient cooperation and inability to change positions Rectal:  Not performed.  Msk:  Symmetrical without gross deformities. Extremities:  Without edema, no deformity or joint abnormality. . Neurologic:  Alert Skin:   Dry and intact without significant lesions or rashes. Psychiatric: Dementia   LAB RESULTS: Recent Labs    01/21/18 1111 01/21/18 1726 01/21/18 2347  01/22/18 0325  WBC 7.0  --   --  8.6  HGB 8.6* 8.2* 8.5* 8.6*  HCT 25.6* 23.9* 25.2* 25.0*  PLT 299  --   --  226   BMET Recent Labs    01/21/18 1111 01/22/18 0325  NA 137 141  K 4.5 3.4*  CL 100* 107  CO2 21* 18*  GLUCOSE 336* 78  BUN 57* 57*  CREATININE 5.79* 5.62*  CALCIUM 8.3* 8.1*   LFT Recent Labs    01/21/18 1111  PROT 7.4  ALBUMIN 3.8  AST 74*  ALT 49  ALKPHOS 111  BILITOT 1.8*   PT/INR Recent Labs    01/21/18 2124  LABPROT 16.9*  INR 1.39    STUDIES: Ct Abdomen Pelvis Wo Contrast  Result Date: 01/21/2018 CLINICAL DATA:  Hyperglycemia, generalize weakness and nausea EXAM: CT ABDOMEN AND PELVIS WITHOUT CONTRAST TECHNIQUE: Multidetector CT imaging of the abdomen and pelvis was performed following the standard protocol without IV contrast. COMPARISON:  None. FINDINGS: Lower chest: Small bilateral pleural effusions with bibasilar atelectasis. Stable cardiomegaly. Bilateral interstitial prominence. Hepatobiliary: No focal liver abnormality is seen. No gallstones, gallbladder wall thickening, or biliary dilatation. Pancreas: Unremarkable. No pancreatic ductal dilatation or surrounding inflammatory changes. Spleen: Normal in size without focal abnormality. Adrenals/Urinary Tract: Adrenal glands are unremarkable. 18 mm hypodense, fluid attenuating left renal mass most consistent with a cyst. No urolithiasis or obstructive uropathy. Relatively decompressed bladder. Stomach/Bowel: Stomach is within normal limits. No evidence of bowel wall thickening, distention, or inflammatory changes. Vascular/Lymphatic: Abdominal aortic atherosclerosis. Normal caliber abdominal aorta. No lymphadenopathy. Reproductive: Uterus and bilateral adnexa are unremarkable. Other: No abdominal wall hernia or abnormality. Trace perihepatic ascites along the inferior margin. Musculoskeletal: No acute osseous abnormality. No aggressive osseous lesion. Degenerative disc disease disc height loss at T12-L1  and L2-3. IMPRESSION: 1. No acute abdominal or pelvic pathology. 2. Small bilateral pleural effusions and mild interstitial edema. Electronically Signed   By: Elige Ko   On: 01/21/2018 13:53   Dg Chest 2 View  Result Date: 01/21/2018 CLINICAL DATA:  Generalized weakness.  Shortness of breath. EXAM: CHEST - 2 VIEW COMPARISON:  Chest x-ray dated Mar 14, 2016. FINDINGS:  Borderline cardiomegaly. Mild pulmonary vascular congestion. New small bilateral pleural effusions with bibasilar airspace opacities. No pneumothorax. No acute osseous abnormality. IMPRESSION: 1. New small bilateral pleural effusions with bibasilar atelectasis versus infiltrates. Electronically Signed   By: Obie Dredge M.D.   On: 01/21/2018 10:46    Impression / Plan:   Impression: 1.  Normocytic anemia: Hemoglobin 8.6 (11.4-14.6 and 2017), FOBT positive stool, no signs of acute GI bleeding, H&H has remained stable overnight; consider relation to chronic kidney disease +/- GI bleed, history of colon resection in 2000 for recurrent diverticular bleeds, CT abdomen/pelvis with no acute abdominal or pelvic pathology 2.  NSTEMI: Being followed by cardiology 3.  Acute encephalopathy on chronic dementia with behavioral disturbance: Thought likely metabolic due to acute renal failure with possible UTI, mild DKA and an STEMI 4.  Acute renal failure on stage III CKD 5.  Pyuria: Started on Vanco/Zosyn in ED  Plan: 1.  Continue supportive measures 2.  Continue to monitor hemoglobinwith transfusion as needed less than 8 3.  At this time with multiple comorbidities and other acute processes, no plans for EGD/colonoscopy testing at this point.  If there are signs of acute GI bleeding, please let us know. 4.  Please await further recommendations from Dr. Christella Hartigan later today.  Thank you for your kind consultation, we will continue to follow.  Violet Baldy [facility]  01/22/2018, 8:49 AM Pager #:  [phone]   ________________________________________________________________________  [facility] MD note:  I personally examined the patient, reviewed the data and agree with the assessment and plan described above.  Anemia, FOBT + stool in the setting dementia, ARF (Cr in 5's now, was in 1's last year).  She cannot provide any history due to her dementia but no overt GI bleeding has been reported.  Anemia is normocytic. CT scan abd/pelvis shows no obvious pathology.  Given her dementia, significant new renal issues I do not plan any invasive GI testing at this point.  Would transfuse if needed, observe for signficant overt bleeding. Will follow along.   Rob Bunting, MD [facility] Pager [phone]

## 2018-01-22 NOTE — Progress Notes (Signed)
PROGRESS NOTE    Tammy Huff  ZOX:[id number] DOB: [birth year] DOA: 01/21/2018 PCP: Massie Maroon, FNP   Brief Narrative:  77 y.o. female with a history of advanced Alzheimer's dementia, diverticulosis, T2DM, stage III CKD, and HTN who was brought to the ED for multiple complaints including confusion.  In the ER patient was found to have elevated troponin, worsening renal failure with a creatinine of 5.7, hyperglycemia with elevated anion gap.  Also with UTI, fecal occult blood test positive.  Treated with antibiotics and admitted for further evaluation.  GI and cardiology consulted on admission.  Assessment & Plan:   #Acute metabolic encephalopathy in underlying advance dementia with behavioral disturbance: Acute change in mental status likely due to UTI.  Patient was agitated overnight and was sleeping this morning.  Continue to treat medical condition, provide supportive care.  Delirium precaution.  I consulted palliative care. -Minimize benzos or sedatives if possible.  #Elevated troponin/non-STEMI: Evaluated by cardiology.  Follow-up echocardiogram.  Trend troponin.  No heparin or aspirin because of possible GI bleed per cardiology.  On Coreg, hydralazine.  #Acute kidney injury on chronic kidney disease stage III in the setting of UTI, CHF, ARB. -UA with UTI. -I will check ultrasound of kidneys, continue Foley catheter -She has pleural effusion and elevated BNP.  I will put her on IV Lasix daily with close monitoring of kidney function.  Strict ins and out, avoid nephrotoxins.  #Acute lower UTI: Continue ceftriaxone.  Follow-up culture result.  #Essential hypertension: Continue clonidine, Coreg and diuretics.  Monitor blood pressure.  #History of type 2 diabetes with mild DKA on admission: Elevated anion gap also contributed by renal failure.  Transition to long-acting insulin and sliding scale.  Monitor blood sugar level.  Order swallow evaluation.  #Normocytic anemia/fecal  occult blood test positive unknown if patient has GI bleed: Seen by GI recommended conservative management and monitoring because of advanced dementia.  I will check iron studies.  Continue Protonix.  DVT prophylaxis:SCD Code Status: Full code Family Communication: No family at bedside Disposition Plan: Currently admitted in a stepdown    Consultants:   Cardiology  GI  Procedures: None Antimicrobials: Ceftriaxone  Subjective: Seen and examined at bedside.  Overnight event noted patient was agitated.  This morning she was on Thursday.  Review of systems limited.  Objective: Vitals:   01/22/18 0800 01/22/18 0900 01/22/18 1000 01/22/18 1100  BP: (!) 168/88 (!) 171/90 (!) 152/82 (!) 138/52  Pulse: 86 84 85 86  Resp: (!) 0 (!) 28 (!) 31 (!) 26  Temp: (!) 97.5 F (36.4 C)     TempSrc: Axillary     SpO2: 97% 95% 96% 99%  Weight:      Height:        Intake/Output Summary (Last 24 hours) at 01/22/2018 1134 Last data filed at 01/22/2018 1100 Gross per 24 hour  Intake 1192.26 ml  Output 500 ml  Net 692.26 ml   Filed Weights   01/21/18 1155 01/21/18 2100 01/22/18 0500  Weight: 98 kg (216 lb) 68.5 kg (151 lb 0.2 oz) 68.9 kg (151 lb 14.4 oz)    Examination:  General exam: Elderly confused female lying in bed Respiratory system: Bibasal decreased breath sound, respiratory for normal Cardiovascular system: S1 & S2 heard, RRR.  No pedal edema. Gastrointestinal system: Abdomen is nondistended, soft and nontender. Normal bowel sounds heard. Central nervous system: Lethargy and somnolent this morning Skin: No rashes, lesions or ulcers Psychiatry: Judgement and insight appear unable to  assess    Data Reviewed: I have personally reviewed following labs and imaging studies  CBC: Recent Labs  Lab 01/21/18 1111 01/21/18 1726 01/21/18 2347 01/22/18 0325  WBC 7.0  --   --  8.6  NEUTROABS 5.5  --   --   --   HGB 8.6* 8.2* 8.5* 8.6*  HCT 25.6* 23.9* 25.2* 25.0*  MCV 89.2  --    --  89.0  PLT 299  --   --  226   Basic Metabolic Panel: Recent Labs  Lab 01/21/18 1111 01/22/18 0325 01/22/18 0854  NA 137 141 139  K 4.5 3.4* 3.9  CL 100* 107 105  CO2 21* 18* 18*  GLUCOSE 336* 78 197*  BUN 57* 57* 57*  CREATININE 5.79* 5.62* 5.62*  CALCIUM 8.3* 8.1* 8.0*   GFR: Estimated Creatinine Clearance: 7.7 mL/min (A) (by C-G formula based on SCr of 5.62 mg/dL (H)). Liver Function Tests: Recent Labs  Lab 01/21/18 1111  AST 74*  ALT 49  ALKPHOS 111  BILITOT 1.8*  PROT 7.4  ALBUMIN 3.8   No results for input(s): LIPASE, AMYLASE in the last 168 hours. No results for input(s): AMMONIA in the last 168 hours. Coagulation Profile: Recent Labs  Lab 01/21/18 2124  INR 1.39   Cardiac Enzymes: Recent Labs  Lab 01/21/18 1111 01/21/18 1726 01/21/18 2124 01/22/18 0325 01/22/18 0854  TROPONINI 0.19* 0.21* 0.21* 0.19* 0.19*   BNP (last 3 results) No results for input(s): PROBNP in the last 8760 hours. HbA1C: Recent Labs    01/21/18 2124  HGBA1C 6.7*   CBG: Recent Labs  Lab 01/22/18 0452 01/22/18 0605 01/22/18 0746 01/22/18 0853 01/22/18 0942  GLUCAP 137* 151* 179* 188* 199*   Lipid Profile: No results for input(s): CHOL, HDL, LDLCALC, TRIG, CHOLHDL, LDLDIRECT in the last 72 hours. Thyroid Function Tests: No results for input(s): TSH, T4TOTAL, FREET4, T3FREE, THYROIDAB in the last 72 hours. Anemia Panel: Recent Labs    01/21/18 1111  VITAMINB12 1,605*  FOLATE 12.6  FERRITIN 210  TIBC 245*  IRON 41  RETICCTPCT 4.9*   Sepsis Labs: Recent Labs  Lab 01/21/18 1111 01/21/18 1530  LATICACIDVEN 2.5* 2.5*    Recent Results (from the past 240 hour(s))  MRSA PCR Screening     Status: None   Collection Time: 01/21/18  9:15 PM  Result Value Ref Range Status   MRSA by PCR NEGATIVE NEGATIVE Final    Comment:        The GeneXpert MRSA Assay (FDA approved for NASAL specimens only), is one component of a comprehensive MRSA  colonization surveillance program. It is not intended to diagnose MRSA infection nor to guide or monitor treatment for MRSA infections. Performed at [facility], [address] [place], [place] [address]          Radiology Studies: Ct Abdomen Pelvis Wo Contrast  Result Date: 01/21/2018 CLINICAL DATA:  Hyperglycemia, generalize weakness and nausea EXAM: CT ABDOMEN AND PELVIS WITHOUT CONTRAST TECHNIQUE: Multidetector CT imaging of the abdomen and pelvis was performed following the standard protocol without IV contrast. COMPARISON:  None. FINDINGS: Lower chest: Small bilateral pleural effusions with bibasilar atelectasis. Stable cardiomegaly. Bilateral interstitial prominence. Hepatobiliary: No focal liver abnormality is seen. No gallstones, gallbladder wall thickening, or biliary dilatation. Pancreas: Unremarkable. No pancreatic ductal dilatation or surrounding inflammatory changes. Spleen: Normal in size without focal abnormality. Adrenals/Urinary Tract: Adrenal glands are unremarkable. 18 mm hypodense, fluid attenuating left renal mass most consistent with a cyst.  No urolithiasis or obstructive uropathy. Relatively decompressed bladder. Stomach/Bowel: Stomach is within normal limits. No evidence of bowel wall thickening, distention, or inflammatory changes. Vascular/Lymphatic: Abdominal aortic atherosclerosis. Normal caliber abdominal aorta. No lymphadenopathy. Reproductive: Uterus and bilateral adnexa are unremarkable. Other: No abdominal wall hernia or abnormality. Trace perihepatic ascites along the inferior margin. Musculoskeletal: No acute osseous abnormality. No aggressive osseous lesion. Degenerative disc disease disc height loss at T12-L1 and L2-3. IMPRESSION: 1. No acute abdominal or pelvic pathology. 2. Small bilateral pleural effusions and mild interstitial edema. Electronically Signed   By: Elige Ko   On: 01/21/2018 13:53   Dg Chest 2 View  Result Date:  01/21/2018 CLINICAL DATA:  Generalized weakness.  Shortness of breath. EXAM: CHEST - 2 VIEW COMPARISON:  Chest x-ray dated Mar 14, 2016. FINDINGS: Borderline cardiomegaly. Mild pulmonary vascular congestion. New small bilateral pleural effusions with bibasilar airspace opacities. No pneumothorax. No acute osseous abnormality. IMPRESSION: 1. New small bilateral pleural effusions with bibasilar atelectasis versus infiltrates. Electronically Signed   By: Obie Dredge M.D.   On: 01/21/2018 10:46        Scheduled Meds: . carvedilol  6.25 mg Oral BID WC  . cloNIDine  0.2 mg Oral BID  . hydrALAZINE  10 mg Intravenous Q4H  . insulin aspart  0-9 Units Subcutaneous TID WC  . insulin glargine  20 Units Subcutaneous Q24H  . insulin regular  0-10 Units Intravenous TID WC  . pantoprazole (PROTONIX) IV  40 mg Intravenous Q24H  . pravastatin  80 mg Oral BH-[id number]  . sodium chloride flush  3 mL Intravenous Q12H   Continuous Infusions: . cefTRIAXone (ROCEPHIN)  IV Stopped (01/21/18 1932)  . dextrose 50 mL/hr at 01/22/18 1100  . insulin (NOVOLIN-R) infusion 0.8 Units/hr (01/22/18 1112)     LOS: 1 day    Christa Fasig Jaynie Collins, MD [facility] Pager [phone]  If 7PM-7AM, please contact night-coverage [email or web address] Password [facility] 01/22/2018, 11:34 AM

## 2018-01-22 NOTE — Progress Notes (Signed)
Blood glucose 56. Pt alert and oriented. Gave of cranberry juice. Pt swallowed with no  Difficulty.

## 2018-01-22 NOTE — Progress Notes (Signed)
MD made aware and orders placed for the following: -Pt has decreased appetite, Q4 CBG with insulin coverage ordered.  D10% discontinued when insulin gtt stopped.  -Monitor showing ST- depression, repeat EKG obtained. -Decreased urine output>> bladder scanner showed 34 mL, adjusting Lasix order to 80 mg bid.  -Pt remains drowsy, avoid sedative medications.  Made MD aware that pt refused pain medication.  Will continue to monitor.

## 2018-01-22 NOTE — Consult Note (Signed)
Consultation Note Date: 01/22/2018   Patient Name: Tammy Huff  DOB: Aug 18, 1941  MRN: [id number]  Age / Sex: 77 y.o., female  PCP: Massie Maroon, FNP Referring Physician: Maxie Barb, MD  Reason for Consultation: Establishing goals of care  HPI/Patient Profile: 77 y.o. female  with past medical history of dementia, TIA, T2DM, CKD, HTN admitted on 01/21/2018 with confusion, decreased po intake. Workup revealed AoC kidney injury (Cr 5.79), elevated troponin (r/t demand ischemia secondary to anemia/ AKI), anemia r/t GI bleed (Hgb 8.6, no further diagnostic workup recommended), UTI, and hyperglycemia. SLP eval was negative for any overt dysphagia or aspiration. Palliative medicine consulted for GOC.     Clinical Assessment and Goals of Care: Patient in bed with eyes closed. She opens her eyes and says "Hi" in response to my greeting and light touch, then becomes agitated and states she is cold repetitively. She does not follow commands or answer questions.  Per nursing she is having some abdominal pain but spits out pain medication and declines other po medications.  Attempted to contact patient's daughter to arrange GOC discussion. Left message requesting return call.  Primary Decision Maker NEXT OF KIN    SUMMARY OF RECOMMENDATIONS -Continue current care -PMT will continue to try and reach patient's family- daughter- Cherith Tewell; son- Tram Wrenn to arrange GOC meeting    Code Status/Advance Care Planning:  Full code  Palliative Prophylaxis:   Delirium Protocol and Frequent Pain Assessment  Additional Recommendations (Limitations, Scope, Preferences):  Full Scope Treatment  Prognosis:    Unable to determine  Discharge Planning: To Be Determined  Primary Diagnoses: Present on Admission: . Acute encephalopathy . DM hyperosmolarity type II, uncontrolled (HCC) .  Dementia with behavioral disturbance . Dehydration . Essential hypertension . Renal failure, acute on chronic (HCC) . GI bleeding . Hypoxia . Acute lower UTI . DKA (diabetic ketoacidosis) (HCC) . NSTEMI (non-ST elevated myocardial infarction) (HCC)   I have reviewed the medical record, interviewed the patient and family, and examined the patient. The following aspects are pertinent.  Past Medical History:  Diagnosis Date  . Arthritis   . Dementia    advanced  . Diabetes mellitus without complication (HCC)   . Diverticulitis   . Hypertension    Social History   Socioeconomic History  . Marital status: Widowed    Spouse name: Not on file  . Number of children: Not on file  . Years of education: Not on file  . Highest education level: Not on file  Occupational History  . Not on file  Social Needs  . Financial resource strain: Not on file  . Food insecurity:    Worry: Not on file    Inability: Not on file  . Transportation needs:    Medical: Not on file    Non-medical: Not on file  Tobacco Use  . Smoking status: Never Smoker  . Smokeless tobacco: Never Used  Substance and Sexual Activity  . Alcohol use: No  . Drug use: Never  .  Sexual activity: Not on file  Lifestyle  . Physical activity:    Days per week: Not on file    Minutes per session: Not on file  . Stress: Not on file  Relationships  . Social connections:    Talks on phone: Not on file    Gets together: Not on file    Attends religious service: Not on file    Active member of club or organization: Not on file    Attends meetings of clubs or organizations: Not on file    Relationship status: Not on file  Other Topics Concern  . Not on file  Social History Narrative  . Not on file   Family History  Family history unknown: Yes   Scheduled Meds: . carvedilol  6.25 mg Oral BID WC  . cloNIDine  0.2 mg Oral BID  . furosemide  80 mg Intravenous Daily  . hydrALAZINE  10 mg Intravenous Q4H  . insulin  aspart  0-9 Units Subcutaneous TID WC  . insulin glargine  20 Units Subcutaneous Q24H  . insulin regular  0-10 Units Intravenous TID WC  . pantoprazole (PROTONIX) IV  40 mg Intravenous Q24H  . pravastatin  80 mg Oral BH-q7a  . sodium chloride flush  3 mL Intravenous Q12H   Continuous Infusions: . cefTRIAXone (ROCEPHIN)  IV Stopped (01/21/18 1932)  . dextrose 50 mL/hr at 01/22/18 1200  . insulin (NOVOLIN-R) infusion Stopped (01/22/18 1201)   PRN Meds:.dextrose, nitroGLYCERIN, ondansetron (ZOFRAN) IV, oxyCODONE Medications Prior to Admission:  Prior to Admission medications   Medication Sig Start Date End Date Taking? Authorizing Provider  amLODipine (NORVASC) 10 MG tablet Take 10 mg by mouth every evening.   Yes [provider]  aspirin EC 325 MG EC tablet Take 1 tablet (325 mg total) by mouth daily. 08/12/14  Yes Richarda OverlieAbrol, Nayana, MD  carvedilol (COREG) 6.25 MG tablet Take 1 tablet (6.25 mg total) by mouth 2 (two) times daily with a meal. Reported on 12/26/2015 03/14/16  Yes Dione BoozeGlick, David, MD  cloNIDine (CATAPRES) 0.2 MG tablet Take 1 tablet (0.2 mg total) by mouth 2 (two) times daily. 03/14/16  Yes Dione BoozeGlick, David, MD  diphenhydrAMINE (BENADRYL) 25 MG tablet Take 25 mg by mouth daily as needed for allergies.    Yes [provider]  famotidine (PEPCID AC) 10 MG chewable tablet Chew 10 mg by mouth every morning.    Yes [provider]  hydrALAZINE (APRESOLINE) 25 MG tablet Take 25 mg by mouth 3 (three) times daily. 12/02/17  Yes [provider]  Iron-Vitamins (GERITOL PO) Take 1 tablet by mouth every morning.   Yes [provider]  losartan-hydrochlorothiazide (HYZAAR) 100-25 MG tablet Take 1 tablet by mouth daily. 03/14/16  Yes Dione BoozeGlick, David, MD  pravastatin (PRAVACHOL) 80 MG tablet Take 1 tablet (80 mg total) by mouth every morning. 03/14/16  Yes Dione BoozeGlick, David, MD  TRULICITY 1.5 MG/0.5ML SOPN Inject 0.5 mLs as directed once a week. 01/06/18  Yes [provider]  insulin aspart (NOVOLOG FLEXPEN) 100 UNIT/ML FlexPen Inject 4 Units into the skin 3 (three) times daily with meals. 03/14/16   Dione BoozeGlick, David, MD  Insulin Glargine (LANTUS SOLOSTAR) 100 UNIT/ML Solostar Pen Inject 40 Units into the skin daily at 10 pm. 03/14/16   Dione BoozeGlick, David, MD  Insulin Pen Needle (RELION PEN NEEDLE 31G/8MM) 31G X 8 MM MISC Use as directed. 03/14/16   Dione BoozeGlick, David, MD  ipratropium (ATROVENT) 0.03 % nasal spray Place 2 sprays into both  nostrils every 12 (twelve) hours. Patient not taking: Reported on 12/26/2015 11/23/15 03/14/16  Alvira Monday, MD   No Known Allergies Review of Systems  Unable to perform ROS: Dementia    Physical Exam  Constitutional: She appears well-developed and well-nourished.  Cardiovascular: Normal rate and regular rhythm.  Neurological:  Does not follow commands or answer questions, UTA orientation  Skin: Skin is warm and dry.  Psychiatric:  Agitated   Nursing note and vitals reviewed.   Vital Signs: BP (!) 128/95   Pulse 84   Temp 98.5 F (36.9 C) (Oral)   Resp (!) 37   Ht 5\' 6"  (1.676 m)   Wt 68.9 kg (151 lb 14.4 oz)   SpO2 98%   BMI 24.52 kg/m  Pain Scale: PAINAD   Pain Score: 4    SpO2: SpO2: 98 % O2 Device:SpO2: 98 % O2 Flow Rate: .O2 Flow Rate (L/min): 2 L/min  IO: Intake/output summary:   Intake/Output Summary (Last 24 hours) at 01/22/2018 1446 Last data filed at 01/22/2018 1200 Gross per 24 hour  Intake 1193.46 ml  Output 500 ml  Net 693.46 ml    LBM: Last BM Date: 01/21/18 Baseline Weight: Weight: 98 kg (216 lb) Most recent weight: Weight: 68.9 kg (151 lb 14.4 oz)     Palliative Assessment/Data: PPS: 20%     Thank you for this consult. Palliative medicine will continue to follow and assist as needed.   Time In: 1350 Time Out: 1450 Time Total: 60 mins Greater than 50%  of this time was spent counseling and coordinating care related to the above assessment and plan.  Signed by: Ocie Bob,  AGNP-C Palliative Medicine    Please contact Palliative Medicine Team phone at [phone] for questions and concerns.  For individual provider: See Loretha Stapler

## 2018-01-22 NOTE — Evaluation (Signed)
Clinical/Bedside Swallow Evaluation Patient Details  Name: Lyda KalataGracie C Rhames MRN: [id number] Date of Birth: 02/23/1941  Today's Date: 01/22/2018 Time: SLP Start Time (ACUTE ONLY): 1250 SLP Stop Time (ACUTE ONLY): 1305 SLP Time Calculation (min) (ACUTE ONLY): 15 min  Past Medical History:  Past Medical History:  Diagnosis Date  . Arthritis   . Dementia    advanced  . Diabetes mellitus without complication (HCC)   . Diverticulitis   . Hypertension    Past Surgical History:  Past Surgical History:  Procedure Laterality Date  . COLON RESECTION     HPI:  77 [facility] - delirium, agitation, n/v - CXR 01/21/18 Bilateral effusions vs infiltrates.  ATX ? NSTEMI or GI bleed.  Pt lives with daughter - poor intake.     Assessment / Plan / Recommendation Clinical Impression  Pt without focal CN deficits observed.  Pt accepted intake of applejuice only - no s/s of aspiration and timely swallow.   Pt declines to consume applesauce or crackers stating "I want to rest".   Do not suspect oropharyngeal dysphagia as swallow was timely and pt complained of "stomach ache".   Recommend diet as tolerated, no SLP follow up indicated.   SLP Visit Diagnosis: Dysphagia, unspecified (R13.10)    Aspiration Risk  Mild aspiration risk    Diet Recommendation Thin liquid(? Start clears due to n/v)   Compensations: Small sips/bites;Slow rate    Other  Recommendations Oral Care Recommendations: Oral care BID   Follow up Recommendations None      Frequency and Duration            Prognosis        Swallow Study   General Date of Onset: 01/22/18 HPI: 77 [facility] - delirium, agitation, n/v - CXR 01/21/18 Bilateral effusions vs infiltrates.  ATX ? NSTEMI or GI bleed.  Pt lives with daughter - poor intake.   Type of Study: Bedside Swallow Evaluation Diet Prior to this Study: Thin liquids;Regular Temperature Spikes Noted: No Respiratory Status: Room air History of  Recent Intubation: No Behavior/Cognition: Alert;Cooperative;Pleasant mood Oral Cavity Assessment: Within Functional Limits Oral Care Completed by SLP: No Oral Cavity - Dentition: Dentures, top;Dentures, bottom Vision: Functional for self-feeding Self-Feeding Abilities: Able to feed self Patient Positioning: Upright in bed Baseline Vocal Quality: Normal Volitional Cough: Cognitively unable to elicit Volitional Swallow: Unable to elicit    Oral/Motor/Sensory Function Overall Oral Motor/Sensory Function: Within functional limits   Ice Chips Ice chips: Not tested   Thin Liquid Thin Liquid: Within functional limits Presentation: Straw;Cup    Nectar Thick Nectar Thick Liquid: Not tested   Honey Thick Honey Thick Liquid: Not tested   Puree Puree: Not tested Other Comments: refused intake   Solid   GO   Solid: Not tested Other Comments: refused intake        Chales AbrahamsKimball, Ottie Tillery Ann 01/22/2018,1:29 PM  Donavan Burnetamara Micai Apolinar, MS [facility] SLP [phone]

## 2018-01-22 NOTE — Progress Notes (Signed)
Subjective:  Patient more awake and alert, complaining of abdominal pain.  Denies any chest pain or shortness of breath.  Objective:  Vital Signs in the last 24 hours: Temp:  [96.3 F (35.7 C)-98.5 F (36.9 C)] 98.5 F (36.9 C) (03/27 1200) Pulse Rate:  [57-102] 87 (03/27 1200) Resp:  [0-42] 27 (03/27 1200) BP: (82-193)/(52-145) 149/80 (03/27 1200) SpO2:  [89 %-100 %] 98 % (03/27 1200) Weight:  [68.5 kg (151 lb 0.2 oz)-68.9 kg (151 lb 14.4 oz)] 68.9 kg (151 lb 14.4 oz) (03/27 0500)  Intake/Output from previous day: 03/26 0701 - 03/27 0700 In: 987 [I.V.:337; IV Piggyback:650] Out: 500 [Urine:500] Intake/Output from this shift: Total I/O In: 256.4 [I.V.:256.4] Out: -   Physical Exam: Neck: no adenopathy, no carotid bruit, no JVD, supple, symmetrical, trachea midline and thyroid not enlarged, symmetric, no tenderness/mass/nodules Lungs: decreased breath sounds at bases with occasional rhonchi.  Air entry improved Heart: regular rate and rhythm, S1, S2 normal and 2/6 systolic murmur noted Abdomen: soft.  Bowel sounds faint generalized tenderness noted.  No guarding  Lab Results: Recent Labs    01/21/18 1111  01/21/18 2347 01/22/18 0325  WBC 7.0  --   --  8.6  HGB 8.6*   < > 8.5* 8.6*  PLT 299  --   --  226   < > = values in this interval not displayed.   Recent Labs    01/22/18 0325 01/22/18 0854  NA 141 139  K 3.4* 3.9  CL 107 105  CO2 18* 18*  GLUCOSE 78 197*  BUN 57* 57*  CREATININE 5.62* 5.62*   Recent Labs    01/22/18 0325 01/22/18 0854  TROPONINI 0.19* 0.19*   Hepatic Function Panel Recent Labs    01/21/18 1111  PROT 7.4  ALBUMIN 3.8  AST 74*  ALT 49  ALKPHOS 111  BILITOT 1.8*   No results for input(s): CHOL in the last 72 hours. No results for input(s): PROTIME in the last 72 hours.  Imaging: Imaging results have been reviewed and Ct Abdomen Pelvis Wo Contrast  Result Date: 01/21/2018 CLINICAL DATA:  Hyperglycemia, generalize weakness and  nausea EXAM: CT ABDOMEN AND PELVIS WITHOUT CONTRAST TECHNIQUE: Multidetector CT imaging of the abdomen and pelvis was performed following the standard protocol without IV contrast. COMPARISON:  None. FINDINGS: Lower chest: Small bilateral pleural effusions with bibasilar atelectasis. Stable cardiomegaly. Bilateral interstitial prominence. Hepatobiliary: No focal liver abnormality is seen. No gallstones, gallbladder wall thickening, or biliary dilatation. Pancreas: Unremarkable. No pancreatic ductal dilatation or surrounding inflammatory changes. Spleen: Normal in size without focal abnormality. Adrenals/Urinary Tract: Adrenal glands are unremarkable. 18 mm hypodense, fluid attenuating left renal mass most consistent with a cyst. No urolithiasis or obstructive uropathy. Relatively decompressed bladder. Stomach/Bowel: Stomach is within normal limits. No evidence of bowel wall thickening, distention, or inflammatory changes. Vascular/Lymphatic: Abdominal aortic atherosclerosis. Normal caliber abdominal aorta. No lymphadenopathy. Reproductive: Uterus and bilateral adnexa are unremarkable. Other: No abdominal wall hernia or abnormality. Trace perihepatic ascites along the inferior margin. Musculoskeletal: No acute osseous abnormality. No aggressive osseous lesion. Degenerative disc disease disc height loss at T12-L1 and L2-3. IMPRESSION: 1. No acute abdominal or pelvic pathology. 2. Small bilateral pleural effusions and mild interstitial edema. Electronically Signed   By: Elige Ko   On: 01/21/2018 13:53   Dg Chest 2 View  Result Date: 01/21/2018 CLINICAL DATA:  Generalized weakness.  Shortness of breath. EXAM: CHEST - 2 VIEW COMPARISON:  Chest x-ray dated Mar 14, 2016.  FINDINGS: Borderline cardiomegaly. Mild pulmonary vascular congestion. New small bilateral pleural effusions with bibasilar airspace opacities. No pneumothorax. No acute osseous abnormality. IMPRESSION: 1. New small bilateral pleural effusions with  bibasilar atelectasis versus infiltrates. Electronically Signed   By: Obie DredgeWilliam T Derry M.D.   On: 01/21/2018 10:46    Cardiac Studies:  Assessment/Plan:  Minimally elevated troponin I secondary to demand ischemia secondary to anemia/acute renal failure doubt significant MI Coronary artery disease history of MI in remote past Abdominal pain, questionable ischemic bowel Uncontrolled hypertension Uncontrolled diabetes mellitus Probable bilateral aspiration pneumonia Acute renal failure UTI Acute on chronic anemia rule out GI loss History of diverticulitis in the past Dementia Degenerative joint disease Plan Continue present management. [facility] hospitalist to see the patient Awaiting 2-D echo  LOS: 1 day    Rinaldo CloudHarwani, Ulices Maack 01/22/2018, 1:00 PM

## 2018-01-23 DIAGNOSIS — Z7189 Other specified counseling: Secondary | ICD-10-CM

## 2018-01-23 DIAGNOSIS — I5021 Acute systolic (congestive) heart failure: Secondary | ICD-10-CM

## 2018-01-23 LAB — URINE CULTURE: CULTURE: NO GROWTH

## 2018-01-23 LAB — IRON AND TIBC
IRON: 35 ug/dL (ref 28–170)
Saturation Ratios: 19 % (ref 10.4–31.8)
TIBC: 189 ug/dL — ABNORMAL LOW (ref 250–450)
UIBC: 154 ug/dL

## 2018-01-23 LAB — COMPREHENSIVE METABOLIC PANEL
ALT: 75 U/L — AB (ref 14–54)
ANION GAP: 10 (ref 5–15)
AST: 75 U/L — ABNORMAL HIGH (ref 15–41)
Albumin: 2.8 g/dL — ABNORMAL LOW (ref 3.5–5.0)
Alkaline Phosphatase: 92 U/L (ref 38–126)
BUN: 60 mg/dL — ABNORMAL HIGH (ref 6–20)
CHLORIDE: 106 mmol/L (ref 101–111)
CO2: 21 mmol/L — ABNORMAL LOW (ref 22–32)
CREATININE: 6.51 mg/dL — AB (ref 0.44–1.00)
Calcium: 7.8 mg/dL — ABNORMAL LOW (ref 8.9–10.3)
GFR, EST AFRICAN AMERICAN: 6 mL/min — AB (ref >60)
GFR, EST NON AFRICAN AMERICAN: 5 mL/min — AB (ref >60)
Glucose, Bld: 163 mg/dL — ABNORMAL HIGH (ref 65–99)
Potassium: 3.3 mmol/L — ABNORMAL LOW (ref 3.5–5.1)
Sodium: 137 mmol/L (ref 135–145)
Total Bilirubin: 0.8 mg/dL (ref 0.3–1.2)
Total Protein: 6 g/dL — ABNORMAL LOW (ref 6.5–8.1)

## 2018-01-23 LAB — CBC
HCT: 24.8 % — ABNORMAL LOW (ref 36.0–46.0)
Hemoglobin: 8.4 g/dL — ABNORMAL LOW (ref 12.0–15.0)
MCH: 30.4 pg (ref 26.0–34.0)
MCHC: 33.9 g/dL (ref 30.0–36.0)
MCV: 89.9 fL (ref 78.0–100.0)
PLATELETS: 216 10*3/uL (ref 150–400)
RBC: 2.76 MIL/uL — AB (ref 3.87–5.11)
RDW: 15.1 % (ref 11.5–15.5)
WBC: 7.6 10*3/uL (ref 4.0–10.5)

## 2018-01-23 LAB — RENAL FUNCTION PANEL
ALBUMIN: 2.6 g/dL — AB (ref 3.5–5.0)
Anion gap: 11 (ref 5–15)
BUN: 56 mg/dL — AB (ref 6–20)
CALCIUM: 7.7 mg/dL — AB (ref 8.9–10.3)
CO2: 21 mmol/L — ABNORMAL LOW (ref 22–32)
CREATININE: 6.06 mg/dL — AB (ref 0.44–1.00)
Chloride: 108 mmol/L (ref 101–111)
GFR, EST AFRICAN AMERICAN: 7 mL/min — AB (ref >60)
GFR, EST NON AFRICAN AMERICAN: 6 mL/min — AB (ref >60)
Glucose, Bld: 101 mg/dL — ABNORMAL HIGH (ref 65–99)
PHOSPHORUS: 3.6 mg/dL (ref 2.5–4.6)
Potassium: 3.7 mmol/L (ref 3.5–5.1)
Sodium: 140 mmol/L (ref 135–145)

## 2018-01-23 LAB — MAGNESIUM
MAGNESIUM: 2.1 mg/dL (ref 1.7–2.4)
Magnesium: 2 mg/dL (ref 1.7–2.4)

## 2018-01-23 LAB — GLUCOSE, CAPILLARY
GLUCOSE-CAPILLARY: 97 mg/dL (ref 65–99)
GLUCOSE-CAPILLARY: 82 mg/dL (ref 65–99)
GLUCOSE-CAPILLARY: 172 mg/dL — AB (ref 65–99)
GLUCOSE-CAPILLARY: 105 mg/dL — AB (ref 65–99)
Glucose-Capillary: 70 mg/dL (ref 65–99)
Glucose-Capillary: 64 mg/dL — ABNORMAL LOW (ref 65–99)
Glucose-Capillary: 96 mg/dL (ref 65–99)
Glucose-Capillary: 90 mg/dL (ref 65–99)
Glucose-Capillary: 134 mg/dL — ABNORMAL HIGH (ref 65–99)

## 2018-01-23 LAB — FERRITIN: FERRITIN: 206 ng/mL (ref 11–307)

## 2018-01-23 MED ORDER — FUROSEMIDE 40 MG PO TABS
40.0000 mg | ORAL_TABLET | Freq: Two times a day (BID) | ORAL | Status: DC
  Administered 2018-01-23 – 2018-01-25 (×5): 40 mg via ORAL
  Filled 2018-01-23 (×6): qty 1

## 2018-01-23 MED ORDER — METOLAZONE 5 MG PO TABS
5.0000 mg | ORAL_TABLET | Freq: Once | ORAL | Status: AC
  Administered 2018-01-23: 5 mg via ORAL
  Filled 2018-01-23: qty 1

## 2018-01-23 MED ORDER — ENSURE ENLIVE PO LIQD
237.0000 mL | ORAL | Status: DC
  Administered 2018-01-23 – 2018-01-27 (×4): 237 mL via ORAL

## 2018-01-23 MED ORDER — ISOSORB DINITRATE-HYDRALAZINE 20-37.5 MG PO TABS
1.0000 | ORAL_TABLET | Freq: Three times a day (TID) | ORAL | Status: DC
  Administered 2018-01-23 – 2018-01-27 (×13): 1 via ORAL
  Filled 2018-01-23 (×14): qty 1

## 2018-01-23 NOTE — Progress Notes (Signed)
Daily Progress Note   Patient Name: Tammy Huff       Date: 01/23/2018 DOB: 1940-12-18  Age: 77 y.o. MRN#: [id number] Attending Physician: Maxie Barb, MD Primary Care Physician: Massie Maroon, FNP Admit Date: 01/21/2018  Reason for Consultation/Follow-up: Establishing goals of care  Subjective: Patient awake and alert, pleasant mood, no agitation noted. Answers multiple questions- states she feels well and she lives with her daughter, Tammy Huff.  Daughter returned my call to nursing station I called from yesterday. I called her back today and left another message requesting her call the PMT phone.   Review of Systems  Unable to perform ROS: Dementia     Length of Stay: 2  Current Medications: Scheduled Meds:  . carvedilol  6.25 mg Oral BID WC  . cloNIDine  0.2 mg Oral BID  . feeding supplement (ENSURE ENLIVE)  237 mL Oral Q24H  . furosemide  80 mg Intravenous BID  . hydrALAZINE  10 mg Intravenous Q4H  . insulin aspart  0-9 Units Subcutaneous Q4H  . pantoprazole (PROTONIX) IV  40 mg Intravenous Q24H  . pravastatin  80 mg Oral BH-q7a  . sodium chloride flush  3 mL Intravenous Q12H    Continuous Infusions: . cefTRIAXone (ROCEPHIN)  IV Stopped (01/22/18 1755)    PRN Meds: dextrose, nitroGLYCERIN, ondansetron (ZOFRAN) IV  Physical Exam  Constitutional: She appears well-developed and well-nourished.  Cardiovascular: Normal rate and regular rhythm.  Pulmonary/Chest: Effort normal and breath sounds normal.  Abdominal: Soft. Bowel sounds are normal.  Neurological: She is alert.  Oriented to person  Nursing note and vitals reviewed.           Vital Signs: BP 102/60   Pulse 68   Temp (!) 97.3 F (36.3 C) (Oral)   Resp (!) 21   Ht 5\' 6"  (1.676 m)   Wt 69.2 kg  (152 lb 8.9 oz)   SpO2 97%   BMI 24.62 kg/m  SpO2: SpO2: 97 % O2 Device: O2 Device: Room Air O2 Flow Rate: O2 Flow Rate (L/min): 2 L/min  Intake/output summary:   Intake/Output Summary (Last 24 hours) at 01/23/2018 1219 Last data filed at 01/23/2018 0800 Gross per 24 hour  Intake 638.34 ml  Output 475 ml  Net 163.34 ml   LBM: Last BM Date: 01/21/18 Baseline Weight:  Weight: 98 kg (216 lb) Most recent weight: Weight: 69.2 kg (152 lb 8.9 oz)       Palliative Assessment/Data: PPS: 30%    Flowsheet Rows     Most Recent Value  Intake Tab  Referral Department  Hospitalist  Unit at Time of Referral  Med/Surg Unit  Palliative Care Primary Diagnosis  Neurology  Date Notified  01/22/18  Palliative Care Type  Return patient Palliative Care  Reason for referral  Clarify Goals of Care  Date of Admission  01/21/18  Date first seen by Palliative Care  01/22/18  # of days Palliative referral response time  0 Day(s)  # of days IP prior to Palliative referral  1  Clinical Assessment  Psychosocial & Spiritual Assessment  Palliative Care Outcomes      Patient Active Problem List   Diagnosis Date Noted  . Anemia   . Goals of care, counseling/discussion   . Palliative care by specialist   . Acute encephalopathy 01/21/2018  . GI bleeding 01/21/2018  . Hypoxia 01/21/2018  . Acute lower UTI 01/21/2018  . DKA (diabetic ketoacidosis) (HCC) 01/21/2018  . NSTEMI (non-ST elevated myocardial infarction) (HCC) 01/21/2018  . Palliative care encounter   . Transient ischemia 04/25/2016  . CKD (chronic kidney disease) 04/25/2016  . Dementia with behavioral disturbance 04/25/2016  . GERD (gastroesophageal reflux disease) 04/25/2016  . Slurred speech 04/25/2016  . Diabetes mellitus with complication (HCC)   . Counseling regarding goals of care   . Diabetes mellitus (HCC) 12/26/2015  . DM hyperosmolarity type II, uncontrolled (HCC) 11/26/2014  . Essential hypertension 11/26/2014  .  Dehydration 11/26/2014  . Renal failure, acute on chronic (HCC) 11/26/2014  . Vertigo 08/12/2014  . DM2 (diabetes mellitus, type 2) (HCC) 08/12/2014  . HTN (hypertension) 08/12/2014  . HLD (hyperlipidemia) 08/12/2014    Palliative Care Assessment & Plan   Patient Profile:  77 y.o. female  with past medical history of dementia, TIA, T2DM, CKD, HTN admitted on 01/21/2018 with confusion, decreased po intake. Workup revealed AoC kidney injury (Cr 5.79), elevated troponin (r/t demand ischemia secondary to anemia/ AKI), anemia r/t GI bleed (Hgb 8.6, no further diagnostic workup recommended), UTI, and hyperglycemia. SLP eval was negative for any overt dysphagia or aspiration. Palliative medicine consulted for GOC.      Assessment/Recommendations/Plan   No family at bedside, left another message for daughter  Patient lives at home with her daughter- she states she would like to return there after discharge  Will continue   Goals of Care and Additional Recommendations:  Limitations on Scope of Treatment: Full Scope Treatment  Code Status:  Full code  Prognosis:   Unable to determine  Discharge Planning:  Home with Palliative Services  Thank you for allowing the Palliative Medicine Team to assist in the care of this patient.   Time In: 1130 Time Out: 1145 Total Time 15 mins Prolonged Time Billed NO      Greater than 50%  of this time was spent counseling and coordinating care related to the above assessment and plan.  Ocie BobKasie Eula Jaster, AGNP-C Palliative Medicine   Please contact Palliative Medicine Team phone at [phone] for questions and concerns.

## 2018-01-23 NOTE — Progress Notes (Signed)
Initial Nutrition Assessment  DOCUMENTATION CODES:   Not applicable  INTERVENTION:  - Will order Ensure Enlive once/day, this supplement provides 350 kcal and 20 grams of protein.  - Continue to encourage PO intakes. - Will monitor for GOC.   NUTRITION DIAGNOSIS:   Inadequate oral intake related to lethargy/confusion as evidenced by meal completion < 50%.  GOAL:   Patient will meet greater than or equal to 90% of their needs  MONITOR:   PO intake, Supplement acceptance, Weight trends, Labs  REASON FOR ASSESSMENT:   Low Braden  ASSESSMENT:   77 y.o. female with a history of advanced Alzheimer's dementia, diverticulosis, T2DM, stage III CKD, and HTN who was brought to the ED for multiple complaints including confusion.  In the ER patient was found to have elevated troponin, worsening renal failure with a creatinine of 5.7, hyperglycemia with elevated anion gap.  Also with UTI, fecal occult blood test positive.  Treated with antibiotics and admitted for further evaluation.  GI and cardiology consulted on admission.  BMI indicates normal weight. No intakes documented. Spoke with RN before entering the room and she reported that pt ate a banana, ate part of her oatmeal, and drank coffee for breakfast this AM. RN reports that pt's daughter will not be present until later today. No family/visitors present at this time.   Pt denies abdominal pain at this time. She confirms drinking coffee this AM and reports she drinks it each morning. She was unable to provide any other nutrition-related information at this time.  She was seen by SLP yesterday and noted to be mild aspiration risk. Palliative saw pt yesterday afternoon and plan to meet with family for GOC discussion. No plans for intervention, per GI, at this time.   Very limited weight hx available in the cahrt. Per review, she has lost 64 lbs (30% body weight) in the past 22 months. This is not significant for time  frame.  Medications reviewed; Labs reviewed; CBGs: 90-97 mg/dL this AM, BUN: 56 mg/dL, creatinine: 0.986.06 mg/dL, Ca: 7.7 mg/dL, GFR: 7 mL/min.      NUTRITION - FOCUSED PHYSICAL EXAM:  Completed/assessed; no fat wasting, mild muscle wasting to shoulder and clavicle areas.  Diet Order:  Diet Carb Modified Fluid consistency: Thin; Room service appropriate? Yes  EDUCATION NEEDS:   No education needs have been identified at this time  Skin:  Skin Assessment: Reviewed RN Assessment  Last BM:  PTA/unknown  Height:   Ht Readings from Last 1 Encounters:  01/21/18 5\' 6"  (1.676 m)    Weight:   Wt Readings from Last 1 Encounters:  01/23/18 152 lb 8.9 oz (69.2 kg)    Ideal Body Weight:  59.09 kg  BMI:  Body mass index is 24.62 kg/m.  Estimated Nutritional Needs:   Kcal:  1450-1660 (21-24 kcal/kg)  Protein:  65-75 grams  Fluid:  >/= 1.6 L/day       Trenton GammonJessica Bekka Qian, MS, RD, LDN, [facility] Inpatient Clinical Dietitian Pager # [phone] After hours/weekend pager # [phone]

## 2018-01-23 NOTE — Progress Notes (Addendum)
    Progress Note   Subjective  Chief Complaint: Anemia, FOBT+ Stool, Nausea  Pt much more alert and happy than yesterday, tells me she is no longer having abdominal pain. (does have dementia) Per nursing no BM since arrival. Her crea did increase overnight and she has had decreased UOP. No sign of GI Bleeding. Hgb stable overnight.   Objective   Vital signs in last 24 hours: Temp:  [97.5 F (36.4 C)-98.6 F (37 C)] 98.2 F (36.8 C) (03/28 0354) Pulse Rate:  [69-87] 72 (03/28 0800) Resp:  [0-37] 16 (03/28 0800) BP: (74-161)/(45-95) 122/62 (03/28 0800) SpO2:  [96 %-99 %] 98 % (03/28 0800) Weight:  [152 lb 8.9 oz (69.2 kg)] 152 lb 8.9 oz (69.2 kg) (03/28 0500) Last BM Date: 01/21/18 General:    Demented AA female in NAD Heart:  Regular rate and rhythm; no murmurs Lungs: Respirations even and unlabored, lungs CTA bilaterally Abdomen:  Soft, nontender and nondistended. Normal bowel sounds. Extremities:  Without edema. Neurologic:  Alert , grossly normal neurologically. Psych:  Dementia   Lab Results: Recent Labs    01/21/18 1111  01/21/18 2347 01/22/18 0325 01/23/18 0350  WBC 7.0  --   --  8.6 7.6  HGB 8.6*   < > 8.5* 8.6* 8.4*  HCT 25.6*   < > 25.2* 25.0* 24.8*  PLT 299  --   --  226 216   < > = values in this interval not displayed.   BMET Recent Labs    01/22/18 0325 01/22/18 0854 01/23/18 0350  NA 141 139 140  K 3.4* 3.9 3.7  CL 107 105 108  CO2 18* 18* 21*  GLUCOSE 78 197* 101*  BUN 57* 57* 56*  CREATININE 5.62* 5.62* 6.06*  CALCIUM 8.1* 8.0* 7.7*   LFT Recent Labs    01/21/18 1111 01/23/18 0350  PROT 7.4  --   ALBUMIN 3.8 2.6*  AST 74*  --   ALT 49  --   ALKPHOS 111  --   BILITOT 1.8*  --    PT/INR Recent Labs    01/21/18 2124  LABPROT 16.9*  INR 1.39      Assessment / Plan:   Assessment: 1. Normocytic Anemia: hgb stable 8.6--> 8.4 overnight, no signs of acute GI bleed, thought related to kidney failure 2. Acute encephalopathy on  chronic dementia 3. NSTEMI 4. Acute Renal failure on Stage II CKD  Plan: 1. Continue supportive measures with monitoring of hgb with transfusion as needed. 2. Again, anemia thought related to kidney disease, no plans for invasive GI testing given significant renal issues and dementia 3. Will decrease hgb to daily 4. Please await any further recs from Dr. Christella HartiganJacobs later today.  Thank you for your kind consultation.    LOS: 2 days   Unk LightningJennifer Lynne Lemmon  01/23/2018, 9:28 AM  Pager # [phone]  ________________________________________________________________________  [facility] MD note:  I personally examined the patient, reviewed the data and agree with the assessment and plan described above.  She is not having overt GI bleeding. No plans for invasive GI testing for her heme +, normocytic anemia.  Clinical situation is dominated by her dementia, uremia, new renal failure, CHF 25-30%.  Comfort measures seem appropriate at this point.  Please call or page with any further questions or concerns.    Rob Buntinganiel Daune Colgate, MD [facility] Pager [phone]

## 2018-01-23 NOTE — Progress Notes (Signed)
Patient has had 2 watery brown/green stools which covered the bed since 7pm. Paged Craige CottaKirby, NP. New order for flexiseal and STAT labs. Continue to monitor.

## 2018-01-23 NOTE — Progress Notes (Signed)
Subjective:  Patient denies any chest pain or shortness of breath.  States abdominal pain is improved.  Noted to have markedly depressed LV systolic function by 2-D echo.  Patient not a candidate for any coronary interventions in view of her acute renal failure, anemia and heme-positive stools/dementia.  Clonidine was held because of low blood pressure  Objective:  Vital Signs in the last 24 hours: Temp:  [97.3 F (36.3 C)-98.6 F (37 C)] 97.3 F (36.3 C) (03/28 0800) Pulse Rate:  [62-87] 62 (03/28 1200) Resp:  [0-37] 21 (03/28 1200) BP: (74-161)/(45-95) 102/57 (03/28 1200) SpO2:  [96 %-100 %] 98 % (03/28 1200) Weight:  [69.2 kg (152 lb 8.9 oz)] 69.2 kg (152 lb 8.9 oz) (03/28 0500)  Intake/Output from previous day: 03/27 0701 - 03/28 0700 In: 834.8 [I.V.:734.8; IV Piggyback:100] Out: 475 [Urine:475] Intake/Output from this shift: Total I/O In: 300 [I.V.:300] Out: -   Physical Exam: Neck: no adenopathy, no carotid bruit, no JVD and supple, symmetrical, trachea midline Lungs: decreased breath sounds at bases Heart: regular rate and rhythm, S1, S2 normal and 2/6 systolic murmur noted Abdomen: soft, non-tender; bowel sounds normal; no masses,  no organomegaly Extremities: extremities normal, atraumatic, no cyanosis or edema  Lab Results: Recent Labs    01/22/18 0325 01/23/18 0350  WBC 8.6 7.6  HGB 8.6* 8.4*  PLT 226 216   Recent Labs    01/22/18 0854 01/23/18 0350  NA 139 140  K 3.9 3.7  CL 105 108  CO2 18* 21*  GLUCOSE 197* 101*  BUN 57* 56*  CREATININE 5.62* 6.06*   Recent Labs    01/22/18 0325 01/22/18 0854  TROPONINI 0.19* 0.19*   Hepatic Function Panel Recent Labs    01/21/18 1111 01/23/18 0350  PROT 7.4  --   ALBUMIN 3.8 2.6*  AST 74*  --   ALT 49  --   ALKPHOS 111  --   BILITOT 1.8*  --    No results for input(s): CHOL in the last 72 hours. No results for input(s): PROTIME in the last 72 hours.  Imaging: Imaging results have been reviewed  and Ct Abdomen Pelvis Wo Contrast  Result Date: 01/21/2018 CLINICAL DATA:  Hyperglycemia, generalize weakness and nausea EXAM: CT ABDOMEN AND PELVIS WITHOUT CONTRAST TECHNIQUE: Multidetector CT imaging of the abdomen and pelvis was performed following the standard protocol without IV contrast. COMPARISON:  None. FINDINGS: Lower chest: Small bilateral pleural effusions with bibasilar atelectasis. Stable cardiomegaly. Bilateral interstitial prominence. Hepatobiliary: No focal liver abnormality is seen. No gallstones, gallbladder wall thickening, or biliary dilatation. Pancreas: Unremarkable. No pancreatic ductal dilatation or surrounding inflammatory changes. Spleen: Normal in size without focal abnormality. Adrenals/Urinary Tract: Adrenal glands are unremarkable. 18 mm hypodense, fluid attenuating left renal mass most consistent with a cyst. No urolithiasis or obstructive uropathy. Relatively decompressed bladder. Stomach/Bowel: Stomach is within normal limits. No evidence of bowel wall thickening, distention, or inflammatory changes. Vascular/Lymphatic: Abdominal aortic atherosclerosis. Normal caliber abdominal aorta. No lymphadenopathy. Reproductive: Uterus and bilateral adnexa are unremarkable. Other: No abdominal wall hernia or abnormality. Trace perihepatic ascites along the inferior margin. Musculoskeletal: No acute osseous abnormality. No aggressive osseous lesion. Degenerative disc disease disc height loss at T12-L1 and L2-3. IMPRESSION: 1. No acute abdominal or pelvic pathology. 2. Small bilateral pleural effusions and mild interstitial edema. Electronically Signed   By: Elige Ko   On: 01/21/2018 13:53   US Renal  Result Date: 01/22/2018 CLINICAL DATA:  Acute kidney injury. EXAM: RENAL / URINARY  TRACT ULTRASOUND COMPLETE COMPARISON:  Noncontrast CT 01/21/2018. FINDINGS: Right Kidney: Length: 9.3 cm. Echogenicity within normal limits. No solid mass or hydronephrosis visualized. Approximately 1 cm  sized cystic lesions are seen in the midpole and superior pole. Left Kidney: Length: 8.7 cm. Echogenicity within normal limits. No mass or hydronephrosis visualized. Hypoechoic lesion LEFT mid pole, 2.4 x 2.1 x 1.9 cm, also likely represents a simple cyst, and correlates with the hypodense abnormality noted on prior CT. Bladder: Foley catheter is in place. IMPRESSION: Normal renal size. No hydronephrosis or calculi. Renal cystic disease. Electronically Signed   By: Elsie StainJohn T Curnes M.D.   On: 01/22/2018 15:21    Cardiac Studies:  Assessment/Plan:  Minimally elevated troponin I secondary to demand ischemia secondary to anemia/acute renal failure doubt significant MI Coronary artery disease history of MI in remote past  Ischemic cardiomyopathy Uncontrolled hypertension Uncontrolled diabetes mellitus Acute renal failure Acute on chronic anemia rule out GI loss History of diverticulitis in the past Dementia Degenerative joint disease Plan DC clonidine. Change IV hydralazine to BiDil. We will uptitrate carvedilol as blood pressure tolerates. Change IV Lasix to by mouth 40 mg twice daily Patient seen by palliative care need to discuss further with family regarding level of care  LOS: 2 days    Rinaldo CloudHarwani, Doyce Saling 01/23/2018, 1:32 PM

## 2018-01-23 NOTE — Progress Notes (Addendum)
PROGRESS NOTE    Tammy Huff  BMW:[id number] DOB: 02/21/1941 DOA: 01/21/2018 PCP: Massie MaroonHollis, Lachina M, FNP   Brief Narrative:  77 y.o. female with a history of advanced Alzheimer's dementia, diverticulosis, T2DM, stage III CKD, and HTN who was brought to the ED for multiple complaints including confusion.  In the ER patient was found to have elevated troponin, worsening renal failure with a creatinine of 5.7, hyperglycemia with elevated anion gap.  Also with UTI, fecal occult blood test positive.  Treated with antibiotics and admitted for further evaluation.  GI and cardiology consulted on admission.  Assessment & Plan:   #Acute metabolic encephalopathy in underlying advance dementia with behavioral disturbance: Acute change in mental status likely due to UTI, renal failure.  -Patient was alert awake, calm and oriented to herself  this morning.  Minimize sedatives.  Provide supportive care.  Evaluation ongoing by palliative care.   #Elevated troponin/non-STEMI, acute systolic congestive heart failure:  -Patient has EF of 20% which is new associated with elevated troponin and lateral T wave changes.  BNP of more than 4500 and chest x-ray with bilateral small pleural effusions.  Evaluated by cardiologist recommended palliative care and supportive care.  Unable to anticoagulate because of fecal occult blood test positive.  Denies chest pain today.  On Lasix 80 twice a day. -Continue Coreg, hydralazine, pravastatin   #Acute kidney injury on chronic kidney disease stage IV in the setting of UTI, CHF, ARB vs progressive CKD.  On reviewing patient's record, patient had last serum creatinine level of 3.8 on 12/29/2016 at outside facility. -UA with UTI. -Ultrasound of kidneys unremarkable.  Patient has Foley catheter with no significant urine output.  Given low EF and congestive heart failure, I will continue IV Lasix 18 g twice a day and add a dose of metolazone today.  Serum creatinine level continues  to increase.  Monitor BMP, urine output and electrolytes very closely.  Given severe dementia, congestive heart failure with EF of 20%, poor functional status I do not think patient is a candidate for renal replacement therapy.  I tried to call patient's daughter today, unable to contact therefore left message to call back.  When I tried to call patient's sons number listed in epic, reported to me as this is wrong number.  Palliative care evaluation ongoing.  - Strict ins and out, avoid nephrotoxins.  #Acute lower UTI: Continue ceftriaxone.  Follow-up culture result.  #Essential hypertension: Continue clonidine, Coreg and diuretics.  Monitor blood pressure.  #History of type 2 diabetes with mild DKA on admission: DKA resolved.  Patient became hypoglycemic last night.  Blood sugar noted to be better now.  Discontinue Lantus.  Discontinue continuous dextrose.  Monitor blood sugar level.  Discussed with nursing staff.  #Normocytic anemia/fecal occult blood test positive unknown if patient has GI bleed: Seen by GI recommended conservative management and palliative care.  And monitoring because of advanced dementia.  Iron stores acceptable.  Continue Protonix and monitor labs.  Hemoglobin is stable today.    #Goals of care discussion: As discussed above, patient has poor prognosis.  Palliative care evaluation ongoing.  Trying to contact family member today without success.  DVT prophylaxis:SCD Code Status: Full code Family Communication: No family at bedside Disposition Plan: Currently admitted in a stepdown    Consultants:   Cardiology  GI  Procedures: None Antimicrobials: Ceftriaxone  Subjective: Seen and examined at bedside.  She looked more alert awake and oriented to herself  today.  Denied nausea  vomiting chest pain or shortness of breath.  Objective: Vitals:   01/23/18 0900 01/23/18 1000 01/23/18 1149 01/23/18 1200  BP: (!) 143/79 (!) 97/51 102/60 (!) 102/57  Pulse: 77 68  62    Resp: 20 (!) 21  (!) 21  Temp:      TempSrc:      SpO2: 100% 97%  98%  Weight:      Height:        Intake/Output Summary (Last 24 hours) at 01/23/2018 1246 Last data filed at 01/23/2018 0800 Gross per 24 hour  Intake 638.34 ml  Output 475 ml  Net 163.34 ml   Filed Weights   01/21/18 2100 01/22/18 0500 01/23/18 0500  Weight: 68.5 kg (151 lb 0.2 oz) 68.9 kg (151 lb 14.4 oz) 69.2 kg (152 lb 8.9 oz)    Examination:  General exam: Elderly female, not in distress more alert Respiratory system: Bibasal decreased breath sound, respiratory for normal Cardiovascular system: Regular rate rhythm S1-S2 normal.  Trace pedal edema. Gastrointestinal system: Abdomen is nondistended, soft and nontender. Normal bowel sounds heard. Central nervous system: Alert awake and oriented to herself today. Skin: No rashes, lesions or ulcers Psychiatry: Judgement and insight appear impaired    Data Reviewed: I have personally reviewed following labs and imaging studies  CBC: Recent Labs  Lab 01/21/18 1111 01/21/18 1726 01/21/18 2347 01/22/18 0325 01/23/18 0350  WBC 7.0  --   --  8.6 7.6  NEUTROABS 5.5  --   --   --   --   HGB 8.6* 8.2* 8.5* 8.6* 8.4*  HCT 25.6* 23.9* 25.2* 25.0* 24.8*  MCV 89.2  --   --  89.0 89.9  PLT 299  --   --  226 216   Basic Metabolic Panel: Recent Labs  Lab 01/21/18 1111 01/22/18 0325 01/22/18 0854 01/23/18 0350  NA 137 141 139 140  K 4.5 3.4* 3.9 3.7  CL 100* 107 105 108  CO2 21* 18* 18* 21*  GLUCOSE 336* 78 197* 101*  BUN 57* 57* 57* 56*  CREATININE 5.79* 5.62* 5.62* 6.06*  CALCIUM 8.3* 8.1* 8.0* 7.7*  MG  --   --   --  2.0  PHOS  --   --   --  3.6   GFR: Estimated Creatinine Clearance: 7.2 mL/min (A) (by C-G formula based on SCr of 6.06 mg/dL (H)). Liver Function Tests: Recent Labs  Lab 01/21/18 1111 01/23/18 0350  AST 74*  --   ALT 49  --   ALKPHOS 111  --   BILITOT 1.8*  --   PROT 7.4  --   ALBUMIN 3.8 2.6*   No results for input(s):  LIPASE, AMYLASE in the last 168 hours. No results for input(s): AMMONIA in the last 168 hours. Coagulation Profile: Recent Labs  Lab 01/21/18 2124  INR 1.39   Cardiac Enzymes: Recent Labs  Lab 01/21/18 1111 01/21/18 1726 01/21/18 2124 01/22/18 0325 01/22/18 0854  TROPONINI 0.19* 0.21* 0.21* 0.19* 0.19*   BNP (last 3 results) No results for input(s): PROBNP in the last 8760 hours. HbA1C: Recent Labs    01/21/18 2124  HGBA1C 6.7*   CBG: Recent Labs  Lab 01/23/18 0316 01/23/18 0400 01/23/18 0600 01/23/18 0855 01/23/18 1137  GLUCAP 64* 97 96 90 134*   Lipid Profile: No results for input(s): CHOL, HDL, LDLCALC, TRIG, CHOLHDL, LDLDIRECT in the last 72 hours. Thyroid Function Tests: No results for input(s): TSH, T4TOTAL, FREET4, T3FREE, THYROIDAB in the last 72 hours.  Anemia Panel: Recent Labs    01/21/18 1111 01/23/18 0350  VITAMINB12 1,605*  --   FOLATE 12.6  --   FERRITIN 210 206  TIBC 245* 189*  IRON 41 35  RETICCTPCT 4.9*  --    Sepsis Labs: Recent Labs  Lab 01/21/18 1111 01/21/18 1530  LATICACIDVEN 2.5* 2.5*    Recent Results (from the past 240 hour(s))  Culture, Urine     Status: None   Collection Time: 01/21/18 12:52 PM  Result Value Ref Range Status   Specimen Description   Final    URINE, RANDOM Performed at [facility], [address], [place], [place] [address]    Special Requests   Final    NONE Performed at [facility], [address], [place], [place] [address]    Culture   Final    NO GROWTH Performed at [facility], [address] [place], [place] [address]    Report Status 01/23/2018 FINAL  Final  MRSA PCR Screening     Status: None   Collection Time: 01/21/18  9:15 PM  Result Value Ref Range Status   MRSA by PCR NEGATIVE NEGATIVE Final    Comment:        The GeneXpert MRSA Assay (FDA approved for NASAL specimens only), is one component of a comprehensive MRSA  colonization surveillance program. It is not intended to diagnose MRSA infection nor to guide or monitor treatment for MRSA infections. Performed at [facility], [address], [place], [place] [address]          Radiology Studies: Ct Abdomen Pelvis Wo Contrast  Result Date: 01/21/2018 CLINICAL DATA:  Hyperglycemia, generalize weakness and nausea EXAM: CT ABDOMEN AND PELVIS WITHOUT CONTRAST TECHNIQUE: Multidetector CT imaging of the abdomen and pelvis was performed following the standard protocol without IV contrast. COMPARISON:  None. FINDINGS: Lower chest: Small bilateral pleural effusions with bibasilar atelectasis. Stable cardiomegaly. Bilateral interstitial prominence. Hepatobiliary: No focal liver abnormality is seen. No gallstones, gallbladder wall thickening, or biliary dilatation. Pancreas: Unremarkable. No pancreatic ductal dilatation or surrounding inflammatory changes. Spleen: Normal in size without focal abnormality. Adrenals/Urinary Tract: Adrenal glands are unremarkable. 18 mm hypodense, fluid attenuating left renal mass most consistent with a cyst. No urolithiasis or obstructive uropathy. Relatively decompressed bladder. Stomach/Bowel: Stomach is within normal limits. No evidence of bowel wall thickening, distention, or inflammatory changes. Vascular/Lymphatic: Abdominal aortic atherosclerosis. Normal caliber abdominal aorta. No lymphadenopathy. Reproductive: Uterus and bilateral adnexa are unremarkable. Other: No abdominal wall hernia or abnormality. Trace perihepatic ascites along the inferior margin. Musculoskeletal: No acute osseous abnormality. No aggressive osseous lesion. Degenerative disc disease disc height loss at T12-L1 and L2-3. IMPRESSION: 1. No acute abdominal or pelvic pathology. 2. Small bilateral pleural effusions and mild interstitial edema. Electronically Signed   By: Elige Ko   On: 01/21/2018 13:53   US Renal  Result Date:  01/22/2018 CLINICAL DATA:  Acute kidney injury. EXAM: RENAL / URINARY TRACT ULTRASOUND COMPLETE COMPARISON:  Noncontrast CT 01/21/2018. FINDINGS: Right Kidney: Length: 9.3 cm. Echogenicity within normal limits. No solid mass or hydronephrosis visualized. Approximately 1 cm sized cystic lesions are seen in the midpole and superior pole. Left Kidney: Length: 8.7 cm. Echogenicity within normal limits. No mass or hydronephrosis visualized. Hypoechoic lesion LEFT mid pole, 2.4 x 2.1 x 1.9 cm, also likely represents a simple cyst, and correlates with the hypodense abnormality noted on prior CT. Bladder: Foley catheter is in place. IMPRESSION: Normal renal  size. No hydronephrosis or calculi. Renal cystic disease. Electronically Signed   By: Elsie Stain M.D.   On: 01/22/2018 15:21        Scheduled Meds: . carvedilol  6.25 mg Oral BID WC  . cloNIDine  0.2 mg Oral BID  . feeding supplement (ENSURE ENLIVE)  237 mL Oral Q24H  . furosemide  80 mg Intravenous BID  . hydrALAZINE  10 mg Intravenous Q4H  . insulin aspart  0-9 Units Subcutaneous Q4H  . pantoprazole (PROTONIX) IV  40 mg Intravenous Q24H  . pravastatin  80 mg Oral BH-q7a  . sodium chloride flush  3 mL Intravenous Q12H   Continuous Infusions: . cefTRIAXone (ROCEPHIN)  IV Stopped (01/22/18 1755)     LOS: 2 days    Kieara Schwark Jaynie Collins, MD [facility] Pager [phone]  If 7PM-7AM, please contact night-coverage [email or web address] Password [facility] 01/23/2018, 12:46 PM

## 2018-01-23 NOTE — Evaluation (Signed)
Physical Therapy Evaluation Patient Details Name: Tammy Huff MRN: [id number] DOB: 10/12/1941 Today's Date: 01/23/2018   History of Present Illness   76 y.o. female with a history of advanced Alzheimer's dementia, diverticulosis, T2DM, stage III CKD, and HTN who was brought to the ED for multiple complaints including confusion.  In the ER patient was found to have elevated troponin, worsening renal failure with a creatinine of 5.7, hyperglycemia with elevated anion gap.  Also with UTI, fecal occult blood test positive.    Clinical Impression  Pt admitted with above diagnosis. Pt currently with functional limitations due to the deficits listed below (see PT Problem List). +2 mod assist for supine to sit and sit to stand. BLEs buckled after standing ~3 seconds. ST-SNF recommended.  Pt will benefit from skilled PT to increase their independence and safety with mobility to allow discharge to the venue listed below.       Follow Up Recommendations SNF;Supervision/Assistance - 24 hour    Equipment Recommendations  Rolling walker with 5" wheels    Recommendations for Other Services       Precautions / Restrictions Precautions Precautions: Fall Precaution Comments: pt poor historian, unable to provide detailed fall hx, though she did report having had some falls Restrictions Weight Bearing Restrictions: No      Mobility  Bed Mobility Overal bed mobility: Needs Assistance Bed Mobility: Supine to Sit     Supine to sit: Max assist     General bed mobility comments: assist to raise trunk, pad used to pivot hips to edge of bed  Transfers Overall transfer level: Needs assistance Equipment used: Rolling walker (2 wheeled) Transfers: Sit to/from [employer] Sit to Stand: +2 safety/equipment;+2 physical assistance;Mod assist Stand pivot transfers: Mod assist;+2 safety/equipment;+2 physical assistance       General transfer comment: assist to rise and steady, BLEs  buckled in standing, pt reported dizziness but vague historian and not able to stand long enough to get BP  Ambulation/Gait             General Gait Details: deferred 2* buckling in standing  Stairs            Wheelchair Mobility    Modified Rankin (Stroke Patients Only)       Balance Overall balance assessment: Needs assistance Sitting-balance support: Feet supported;No upper extremity supported Sitting balance-Leahy Scale: Good     Standing balance support: Bilateral upper extremity supported Standing balance-Leahy Scale: Zero Standing balance comment: BLEs buckled after standing ~3 seconds                             Pertinent Vitals/Pain Faces Pain Scale: No hurt    Home Living Family/patient expects to be discharged to:: Unsure Living Arrangements: Children                    Prior Function           Comments: pt poor historian 2* confusion, no family present. Per chart, pt lives with daughter. PLOF unknown.     Hand Dominance        Extremity/Trunk Assessment   Upper Extremity Assessment Upper Extremity Assessment: Generalized weakness    Lower Extremity Assessment Lower Extremity Assessment: Generalized weakness(B knee ext 4/5)    Cervical / Trunk Assessment Cervical / Trunk Assessment: Kyphotic(forward flexed head)  Communication   Communication: No difficulties  Cognition Arousal/Alertness: Awake/alert Behavior During Therapy: WFL for tasks assessed/performed  Overall Cognitive Status: No family/caregiver present to determine baseline cognitive functioning                                 General Comments: pt oriented to self and location, not oriented to month/year nor president      General Comments      Exercises     Assessment/Plan    PT Assessment Patient needs continued PT services  PT Problem List Decreased strength;Decreased activity tolerance;Decreased balance;Decreased  cognition;Decreased mobility;Decreased safety awareness       PT Treatment Interventions Gait training;Functional mobility training;Therapeutic activities;Balance training;DME instruction;Patient/family education    PT Goals (Current goals can be found in the Care Plan section)  Acute Rehab PT Goals PT Goal Formulation: Patient unable to participate in goal setting Time For Goal Achievement: 02/06/18 Potential to Achieve Goals: Fair    Frequency Min 2X/week   Barriers to discharge        Co-evaluation               AM-PAC PT "6 Clicks" Daily Activity  Outcome Measure Difficulty turning over in bed (including adjusting bedclothes, sheets and blankets)?: Unable Difficulty moving from lying on back to sitting on the side of the bed? : Unable Difficulty sitting down on and standing up from a chair with arms (e.g., wheelchair, bedside commode, etc,.)?: Unable Help needed moving to and from a bed to chair (including a wheelchair)?: Total Help needed walking in hospital room?: Total Help needed climbing 3-5 steps with a railing? : Total 6 Click Score: 6    End of Session Equipment Utilized During Treatment: Gait belt Activity Tolerance: Patient tolerated treatment well Patient left: in chair;with call bell/phone within reach;with chair alarm set Nurse Communication: Mobility status PT Visit Diagnosis: Muscle weakness (generalized) (M62.81);Difficulty in walking, not elsewhere classified (R26.2);Dizziness and giddiness (R42)    Time: [id number] PT Time Calculation (min) (ACUTE ONLY): 14 min   Charges:   PT Evaluation $PT Eval Moderate Complexity: 1 Mod     PT G Codes:          Tamala SerUhlenberg, Teriyah Purington Kistler 01/23/2018, 2:40 PM  [phone]

## 2018-01-23 NOTE — Care Management Note (Signed)
Case Management Note  Patient Details  Name: Lyda KalataGracie C Ostrander MRN: [id number] Date of Birth: 09/16/1941  Subjective/Objective: Full code. Noted for palliative cons.                   Action/Plan:d/c plan home w/HHC.   Expected Discharge Date:  (unknown)               Expected Discharge Plan:  Home w Home Health Services  In-House Referral:     Discharge planning Services  CM Consult  Post Acute Care Choice:    Choice offered to:     DME Arranged:    DME Agency:     HH Arranged:    HH Agency:     Status of Service:  In process, will continue to follow  If discussed at Long Length of Stay Meetings, dates discussed:    Additional Comments:  Lanier ClamMahabir, Flavius Repsher, RN 01/23/2018, 11:11 AM

## 2018-01-24 LAB — GLUCOSE, CAPILLARY
GLUCOSE-CAPILLARY: 164 mg/dL — AB (ref 65–99)
GLUCOSE-CAPILLARY: 158 mg/dL — AB (ref 65–99)
Glucose-Capillary: 101 mg/dL — ABNORMAL HIGH (ref 65–99)
Glucose-Capillary: 80 mg/dL (ref 65–99)
Glucose-Capillary: 176 mg/dL — ABNORMAL HIGH (ref 65–99)
Glucose-Capillary: 181 mg/dL — ABNORMAL HIGH (ref 65–99)

## 2018-01-24 LAB — CBC
HEMATOCRIT: 25.9 % — AB (ref 36.0–46.0)
Hemoglobin: 8.7 g/dL — ABNORMAL LOW (ref 12.0–15.0)
MCH: 30.4 pg (ref 26.0–34.0)
MCHC: 33.6 g/dL (ref 30.0–36.0)
MCV: 90.6 fL (ref 78.0–100.0)
Platelets: 206 10*3/uL (ref 150–400)
RBC: 2.86 MIL/uL — ABNORMAL LOW (ref 3.87–5.11)
RDW: 15 % (ref 11.5–15.5)
WBC: 7.6 10*3/uL (ref 4.0–10.5)

## 2018-01-24 LAB — RENAL FUNCTION PANEL
ALBUMIN: 2.6 g/dL — AB (ref 3.5–5.0)
Anion gap: 12 (ref 5–15)
BUN: 63 mg/dL — AB (ref 6–20)
CHLORIDE: 106 mmol/L (ref 101–111)
CO2: 20 mmol/L — ABNORMAL LOW (ref 22–32)
CREATININE: 6.73 mg/dL — AB (ref 0.44–1.00)
Calcium: 7.8 mg/dL — ABNORMAL LOW (ref 8.9–10.3)
GFR calc Af Amer: 6 mL/min — ABNORMAL LOW (ref >60)
GFR, EST NON AFRICAN AMERICAN: 5 mL/min — AB (ref >60)
GLUCOSE: 96 mg/dL (ref 65–99)
PHOSPHORUS: 4.1 mg/dL (ref 2.5–4.6)
POTASSIUM: 3.3 mmol/L — AB (ref 3.5–5.1)
Sodium: 138 mmol/L (ref 135–145)

## 2018-01-24 LAB — MAGNESIUM: MAGNESIUM: 2.1 mg/dL (ref 1.7–2.4)

## 2018-01-24 MED ORDER — SODIUM BICARBONATE 650 MG PO TABS
650.0000 mg | ORAL_TABLET | Freq: Two times a day (BID) | ORAL | Status: DC
  Administered 2018-01-24 – 2018-01-27 (×7): 650 mg via ORAL
  Filled 2018-01-24 (×7): qty 1

## 2018-01-24 NOTE — Progress Notes (Addendum)
No charge note:   PMT has made multiple attempts to contact family members for GOC meeting by leaving messages for Danie BinderLouise Schloemer and Rebeca AllegraChristopher Tarter with no return phone calls. I have left my card with PMT contact information at the bedside. Note that LCSW is contacting Patent examinerlaw enforcement for welfare check to try and get in touch with family members.  If family member returns call to PMT then GOC meeting will be arranged.  Ocie BobKasie Mahan, AGNP-C Palliative Medicine  Please call Palliative Medicine team phone with any questions [phone]. For individual providers please see AMION.

## 2018-01-24 NOTE — Progress Notes (Addendum)
PROGRESS NOTE    Tammy Huff  ZOX:[id number] DOB: [birth year] DOA: 01/21/2018 PCP: Massie Maroon, FNP   Brief Narrative:  77 y.o. female with a history of advanced Alzheimer's dementia, diverticulosis, T2DM, stage III CKD, and HTN who was brought to the ED for multiple complaints including confusion.  In the ER patient was found to have elevated troponin, worsening renal failure with a creatinine of 5.7, hyperglycemia with elevated anion gap.  Also with UTI, fecal occult blood test positive.  Treated with antibiotics and admitted for further evaluation.  GI and cardiology consulted on admission.  Assessment & Plan:   #Acute metabolic encephalopathy in underlying advance dementia with behavioral disturbance: Acute change in mental status likely due to UTI, renal failure.  -Patient was alert awake and oriented to herself and hospital today.  Continue to monitor mental status.  No focal deficit.   #Elevated troponin/non-STEMI, acute systolic congestive heart failure:  -Patient has EF of 20% which is new associated with elevated troponin and lateral T wave changes.  BNP of more than 4500 and chest x-ray with bilateral small pleural effusions.  Evaluated by cardiologist recommended palliative care and supportive care.  Unable to anticoagulate because of fecal occult blood test positive.  Denies chest pain today.  -Treated with IV Lasix and oral metolazone with no significant urine output.  The Lasix was changed to oral by cardiologist.  Continue Coreg, BiDil.  Patient does not have chest pain or shortness of breath.  #Acute kidney injury on chronic kidney disease stage IV in the setting of UTI, CHF, ARB vs progressive CKD.  On reviewing patient's record, patient had last serum creatinine level of 3.8 on 12/29/2016 at outside facility. -UA with UTI. -Ultrasound of kidneys unremarkable.  Patient has Foley catheter with no significant urine output.  -Treated with IV Lasix and metolazone with no  significant urine output.  Serum creatinine level continues to rise. -Given advanced dementia, congestive heart failure with EF of 20%, poor functional status,age, I do not think renal replacement therapy will improve the quality of life.  I have been trying to contact patient's family member without success in order to address the goals of care.  Tried to call patient's daughter today with no success.  The provided son's phone number was called yesterday which was reported as wrong number.  Palliative care evaluation ongoing and planning to be intact with the family members regarding to address goals of care. D/w PCT team -For now closely monitor urine output, BMP and electrolytes. -I will start sod bicarb  #Acute lower UTI: Urine culture negative.  Treated with 3 days of ceftriaxone.  #Essential hypertension:  Monitor blood pressure.  Continue current meds  #History of type 2 diabetes with mild DKA on admission: DKA resolved.  Patient has lower blood sugar level therefore Lantus was discontinued.  Continue sliding scale to manage hyperglycemia.  #Normocytic anemia/fecal occult blood test positive unknown if patient has GI bleed: Seen by GI recommended conservative management, palliative care and monitoring because of advanced dementia.  Iron stores acceptable.  Continue Protonix and monitor labs.  Hemoglobin is stable today.    #Goals of care discussion: As discussed above.  Patient has poor prognosis.  Palliative care following closely.  Attempt to contact family has been unsuccessful so far.  DVT prophylaxis:SCD Code Status: Full code Family Communication: No family at bedside Disposition Plan: Currently admitted in a stepdown    Consultants:   Cardiology  GI  Palliative care  Procedures: None  Antimicrobials: Ceftriaxone completed on 3/29  Subjective:  Seen and examined at bedside.  Alert awake and comfortable.  Oriented to herself and hospital.  Denied headache, dizziness,  nausea vomiting chest pain.  Objective: Vitals:   01/24/18 0400 01/24/18 0500 01/24/18 0600 01/24/18 0800  BP: 124/61  123/64   Pulse: 72  73   Resp: 15  18   Temp:    97.7 F (36.5 C)  TempSrc:    Oral  SpO2: 96%  99%   Weight:  69.8 kg (153 lb 14.1 oz)    Height:        Intake/Output Summary (Last 24 hours) at 01/24/2018 1044 Last data filed at 01/24/2018 1610 Gross per 24 hour  Intake 700 ml  Output 475 ml  Net 225 ml   Filed Weights   01/22/18 0500 01/23/18 0500 01/24/18 0500  Weight: 68.9 kg (151 lb 14.4 oz) 69.2 kg (152 lb 8.9 oz) 69.8 kg (153 lb 14.1 oz)    Examination:  General exam: Elderly female, not in distress Respiratory system: Clear bilateral, respiratory for normal Cardiovascular system: Regular rate rhythm S1-S2 normal, no edema. Gastrointestinal system: Abdomen is soft, nontender, nondistended.  Bowel sounds positive. Central nervous system: Alert awake and following commands Skin: No rashes, lesions or ulcers Psychiatry: Judgement and insight appear impaired    Data Reviewed: I have personally reviewed following labs and imaging studies  CBC: Recent Labs  Lab 01/21/18 1111 01/21/18 1726 01/21/18 2347 01/22/18 0325 01/23/18 0350 01/24/18 0310  WBC 7.0  --   --  8.6 7.6 7.6  NEUTROABS 5.5  --   --   --   --   --   HGB 8.6* 8.2* 8.5* 8.6* 8.4* 8.7*  HCT 25.6* 23.9* 25.2* 25.0* 24.8* 25.9*  MCV 89.2  --   --  89.0 89.9 90.6  PLT 299  --   --  226 216 206   Basic Metabolic Panel: Recent Labs  Lab 01/22/18 0325 01/22/18 0854 01/23/18 0350 01/23/18 2142 01/24/18 0310  NA 141 139 140 137 138  K 3.4* 3.9 3.7 3.3* 3.3*  CL 107 105 108 106 106  CO2 18* 18* 21* 21* 20*  GLUCOSE 78 197* 101* 163* 96  BUN 57* 57* 56* 60* 63*  CREATININE 5.62* 5.62* 6.06* 6.51* 6.73*  CALCIUM 8.1* 8.0* 7.7* 7.8* 7.8*  MG  --   --  2.0 2.1 2.1  PHOS  --   --  3.6  --  4.1   GFR: Estimated Creatinine Clearance: 6.4 mL/min (A) (by C-G formula based on SCr  of 6.73 mg/dL (H)). Liver Function Tests: Recent Labs  Lab 01/21/18 1111 01/23/18 0350 01/23/18 2142 01/24/18 0310  AST 74*  --  75*  --   ALT 49  --  75*  --   ALKPHOS 111  --  92  --   BILITOT 1.8*  --  0.8  --   PROT 7.4  --  6.0*  --   ALBUMIN 3.8 2.6* 2.8* 2.6*   No results for input(s): LIPASE, AMYLASE in the last 168 hours. No results for input(s): AMMONIA in the last 168 hours. Coagulation Profile: Recent Labs  Lab 01/21/18 2124  INR 1.39   Cardiac Enzymes: Recent Labs  Lab 01/21/18 1111 01/21/18 1726 01/21/18 2124 01/22/18 0325 01/22/18 0854  TROPONINI 0.19* 0.21* 0.21* 0.19* 0.19*   BNP (last 3 results) No results for input(s): PROBNP in the last 8760 hours. HbA1C: Recent Labs    01/21/18  2124  HGBA1C 6.7*   CBG: Recent Labs  Lab 01/23/18 1625 01/23/18 1916 01/23/18 2315 01/24/18 0339 01/24/18 0738  GLUCAP 82 172* 105* 101* 80   Lipid Profile: No results for input(s): CHOL, HDL, LDLCALC, TRIG, CHOLHDL, LDLDIRECT in the last 72 hours. Thyroid Function Tests: No results for input(s): TSH, T4TOTAL, FREET4, T3FREE, THYROIDAB in the last 72 hours. Anemia Panel: Recent Labs    01/21/18 1111 01/23/18 0350  VITAMINB12 1,605*  --   FOLATE 12.6  --   FERRITIN 210 206  TIBC 245* 189*  IRON 41 35  RETICCTPCT 4.9*  --    Sepsis Labs: Recent Labs  Lab 01/21/18 1111 01/21/18 1530  LATICACIDVEN 2.5* 2.5*    Recent Results (from the past 240 hour(s))  Culture, Urine     Status: None   Collection Time: 01/21/18 12:52 PM  Result Value Ref Range Status   Specimen Description   Final    URINE, RANDOM Performed at [facility], [address] [place], [place] [address]    Special Requests   Final    NONE Performed at [facility], [address] [place], [place] [address]    Culture   Final    NO GROWTH Performed at [facility], [address] [place], [place] [address]    Report Status  01/23/2018 FINAL  Final  MRSA PCR Screening     Status: None   Collection Time: 01/21/18  9:15 PM  Result Value Ref Range Status   MRSA by PCR NEGATIVE NEGATIVE Final    Comment:        The GeneXpert MRSA Assay (FDA approved for NASAL specimens only), is one component of a comprehensive MRSA colonization surveillance program. It is not intended to diagnose MRSA infection nor to guide or monitor treatment for MRSA infections. Performed at [facility], [address] [place], [place] [address]          Radiology Studies: [place] Renal  Result Date: 01/22/2018 CLINICAL DATA:  Acute kidney injury. EXAM: RENAL / URINARY TRACT ULTRASOUND COMPLETE COMPARISON:  Noncontrast CT 01/21/2018. FINDINGS: Right Kidney: Length: 9.3 cm. Echogenicity within normal limits. No solid mass or hydronephrosis visualized. Approximately 1 cm sized cystic lesions are seen in the midpole and superior pole. Left Kidney: Length: 8.7 cm. Echogenicity within normal limits. No mass or hydronephrosis visualized. Hypoechoic lesion LEFT mid pole, 2.4 x 2.1 x 1.9 cm, also likely represents a simple cyst, and correlates with the hypodense abnormality noted on prior CT. Bladder: Foley catheter is in place. IMPRESSION: Normal renal size. No hydronephrosis or calculi. Renal cystic disease. Electronically Signed   By: Elsie StainJohn T Curnes M.D.   On: 01/22/2018 15:21        Scheduled Meds: . carvedilol  6.25 mg Oral BID WC  . feeding supplement (ENSURE ENLIVE)  237 mL Oral Q24H  . furosemide  40 mg Oral BID  . insulin aspart  0-9 Units Subcutaneous Q4H  . isosorbide-hydrALAZINE  1 tablet Oral TID  . pantoprazole (PROTONIX) IV  40 mg Intravenous Q24H  . pravastatin  80 mg Oral BH-q7a  . sodium chloride flush  3 mL Intravenous Q12H   Continuous Infusions: . cefTRIAXone (ROCEPHIN)  IV 1 g (01/23/18 1814)     LOS: 3 days    Rashawna Scoles Jaynie CollinsPrasad Arnelle Nale, MD [facility] Pager [phone]  If 7PM-7AM,  please contact night-coverage [email or web address] Password [email or web address] 01/24/2018, 10:44 AM

## 2018-01-24 NOTE — Progress Notes (Signed)
Subjective:  Patient denies any chest pain or shortness of breath. Denies abdominal pain states overall feeling better  Objective:  Vital Signs in the last 24 hours: Temp:  [97.4 F (36.3 C)-98.5 F (36.9 C)] 98.3 F (36.8 C) (03/29 1600) Pulse Rate:  [57-76] 70 (03/29 1806) Resp:  [15-23] 23 (03/29 1806) BP: (84-124)/(50-67) 121/55 (03/29 1806) SpO2:  [94 %-100 %] 98 % (03/29 1806) Weight:  [69.8 kg (153 lb 14.1 oz)] 69.8 kg (153 lb 14.1 oz) (03/29 0500)  Intake/Output from previous day: 03/28 0701 - 03/29 0700 In: 960 [P.O.:560; I.V.:300; IV Piggyback:100] Out: 475 [Urine:375; Stool:100] Intake/Output from this shift: No intake/output data recorded.  Physical Exam: Neck: no adenopathy, no carotid bruit, no JVD and supple, symmetrical, trachea midline Lungs: decreased breath sound at bases with bilateral rales noted Heart: regular rate and rhythm, S1, S2 normal and 2/6 systolic murmur noted Abdomen: soft, non-tender; bowel sounds normal; no masses,  no organomegaly Extremities: extremities normal, atraumatic, no cyanosis or edema  Lab Results: Recent Labs    01/23/18 0350 01/24/18 0310  WBC 7.6 7.6  HGB 8.4* 8.7*  PLT 216 206   Recent Labs    01/23/18 2142 01/24/18 0310  NA 137 138  K 3.3* 3.3*  CL 106 106  CO2 21* 20*  GLUCOSE 163* 96  BUN 60* 63*  CREATININE 6.51* 6.73*   Recent Labs    01/22/18 0325 01/22/18 0854  TROPONINI 0.19* 0.19*   Hepatic Function Panel Recent Labs    01/23/18 2142 01/24/18 0310  PROT 6.0*  --   ALBUMIN 2.8* 2.6*  AST 75*  --   ALT 75*  --   ALKPHOS 92  --   BILITOT 0.8  --    No results for input(s): CHOL in the last 72 hours. No results for input(s): PROTIME in the last 72 hours.  Imaging: Imaging results have been reviewed and No results found.  Cardiac Studies:  Assessment/Plan:  Minimally elevated troponin I secondary to demand ischemia secondary to anemia/acute renal failure doubt significant MI Coronary  artery disease history of MI in remote past  Ischemic cardiomyopathy Uncontrolled hypertension Uncontrolled diabetes mellitus Acute renal failure Acute on chronic anemia rule out GI loss History of diverticulitis in the past Dementia Degenerative joint disease Plan Continue present management as per primary team Okay to transfer to telemetry from cardiac point of view.    LOS: 3 days    Tammy Huff, Tammy Huff 01/24/2018, 6:37 PM

## 2018-01-24 NOTE — Progress Notes (Signed)
CSW Consult- Difficulty contacting family for patient.  CSW called non emergency law enforcement to complete welfare check and locate family if possible. CSW provided the number to the intensive care unit.   Vivi BarrackNicole Lessa Huge, Theresia MajorsLCSWA, MSW Clinical Social Worker  [phone] 01/24/2018  3:43 PM

## 2018-01-25 LAB — GLUCOSE, CAPILLARY
GLUCOSE-CAPILLARY: 115 mg/dL — AB (ref 65–99)
GLUCOSE-CAPILLARY: 115 mg/dL — AB (ref 65–99)
GLUCOSE-CAPILLARY: 200 mg/dL — AB (ref 65–99)
Glucose-Capillary: 119 mg/dL — ABNORMAL HIGH (ref 65–99)
Glucose-Capillary: 224 mg/dL — ABNORMAL HIGH (ref 65–99)
Glucose-Capillary: 232 mg/dL — ABNORMAL HIGH (ref 65–99)

## 2018-01-25 LAB — RENAL FUNCTION PANEL
ALBUMIN: 2.8 g/dL — AB (ref 3.5–5.0)
ANION GAP: 14 (ref 5–15)
BUN: 63 mg/dL — ABNORMAL HIGH (ref 6–20)
CALCIUM: 7.8 mg/dL — AB (ref 8.9–10.3)
CO2: 19 mmol/L — ABNORMAL LOW (ref 22–32)
Chloride: 103 mmol/L (ref 101–111)
Creatinine, Ser: 7.2 mg/dL — ABNORMAL HIGH (ref 0.44–1.00)
GFR calc Af Amer: 6 mL/min — ABNORMAL LOW (ref >60)
GFR, EST NON AFRICAN AMERICAN: 5 mL/min — AB (ref >60)
Glucose, Bld: 115 mg/dL — ABNORMAL HIGH (ref 65–99)
PHOSPHORUS: 4.3 mg/dL (ref 2.5–4.6)
POTASSIUM: 3.5 mmol/L (ref 3.5–5.1)
Sodium: 136 mmol/L (ref 135–145)

## 2018-01-25 LAB — CBC
HEMATOCRIT: 26.3 % — AB (ref 36.0–46.0)
HEMOGLOBIN: 8.7 g/dL — AB (ref 12.0–15.0)
MCH: 29.6 pg (ref 26.0–34.0)
MCHC: 33.1 g/dL (ref 30.0–36.0)
MCV: 89.5 fL (ref 78.0–100.0)
Platelets: 216 10*3/uL (ref 150–400)
RBC: 2.94 MIL/uL — AB (ref 3.87–5.11)
RDW: 15.1 % (ref 11.5–15.5)
WBC: 7.1 10*3/uL (ref 4.0–10.5)

## 2018-01-25 NOTE — Progress Notes (Signed)
Subjective:  Patient denies any chest pain or shortness of breath.  Objective:  Vital Signs in the last 24 hours: Temp:  [97.5 F (36.4 C)-98.5 F (36.9 C)] 97.5 F (36.4 C) (03/30 0800) Pulse Rate:  [33-111] 71 (03/30 1000) Resp:  [12-23] 16 (03/30 1000) BP: (102-132)/(51-67) 117/53 (03/30 1000) SpO2:  [87 %-100 %] 97 % (03/30 1000) Weight:  [69.3 kg (152 lb 12.5 oz)] 69.3 kg (152 lb 12.5 oz) (03/30 0500)  Intake/Output from previous day: 03/29 0701 - 03/30 0700 In: -  Out: 675 [Urine:525; Stool:150] Intake/Output from this shift: Total I/O In: -  Out: 50 [Stool:50]  Physical Exam: Neck: no adenopathy, no carotid bruit, no JVD and supple, symmetrical, trachea midline Lungs: decrease breath sounds at bases with bibasilarRales noted Heart: regular rate and rhythm, S1, S2 normal and 2/6 systolic murmur noted Abdomen: soft, non-tender; bowel sounds normal; no masses,  no organomegaly Extremities: extremities normal, atraumatic, no cyanosis or edema  Lab Results: Recent Labs    01/24/18 0310 01/25/18 0339  WBC 7.6 7.1  HGB 8.7* 8.7*  PLT 206 216   Recent Labs    01/24/18 0310 01/25/18 0339  NA 138 136  K 3.3* 3.5  CL 106 103  CO2 20* 19*  GLUCOSE 96 115*  BUN 63* 63*  CREATININE 6.73* 7.20*   No results for input(s): TROPONINI in the last 72 hours.  Invalid input(s): CK, MB Hepatic Function Panel Recent Labs    01/23/18 2142  01/25/18 0339  PROT 6.0*  --   --   ALBUMIN 2.8*   < > 2.8*  AST 75*  --   --   ALT 75*  --   --   ALKPHOS 92  --   --   BILITOT 0.8  --   --    < > = values in this interval not displayed.   No results for input(s): CHOL in the last 72 hours. No results for input(s): PROTIME in the last 72 hours.  Imaging: Imaging results have been reviewed and No results found.  Cardiac Studies:  Assessment/Plan:  Minimally elevated troponin I secondary to demand ischemia secondary to anemia/acute renal failure doubt significant  MI Coronary artery disease history of MI in remote past Ischemic cardiomyopathy Hypertension Diabetes mellitus Acute renal failure Acute on chronic anemia rule out GI loss History of diverticulitis in the past Dementia Degenerative joint disease Plan Continue present management I will sign off please call if needed   LOS: 4 days    Tammy Huff, Tammy Huff 01/25/2018, 11:26 AM

## 2018-01-25 NOTE — Progress Notes (Signed)
PROGRESS NOTE    Tammy Huff  ZOX:[id number] DOB: 05-Jan-1941 DOA: 01/21/2018 PCP: Massie Maroon, FNP   Brief Narrative: Patient is a 77 year old female with past medical history of advanced Alzheimer's dementia, diverticulosis, type 2 diabetes mellitus, stage III CKD, hypertension who was brought to the emergency department for multiple complaints. Patient was found to be confused on presentation.  Patient was also found to have elevated troponin, worsening kidney function, hyperglycemia with elevated anion gap.  Also found to have urinary tract infectinon .Her FOBT was also positive.  GI and cardiology were consulted. Her creatinine is worsening so nephrology also requested for evaluation today.   Assessment & Plan:   Principal Problem:   Acute encephalopathy Active Problems:   DM hyperosmolarity type II, uncontrolled (HCC)   Essential hypertension   Dehydration   Renal failure, acute on chronic (HCC)   Dementia with behavioral disturbance   GI bleeding   Hypoxia   Acute lower UTI   DKA (diabetic ketoacidosis) (HCC)   NSTEMI (non-ST elevated myocardial infarction) (HCC)   Anemia   Goals of care, counseling/discussion   Palliative care by specialist   Advance care planning   Acute systolic CHF (congestive heart failure) (HCC)  Acute metabolic encephalopathy: Probably secondary to urinary tract infection, uremia.  Patient also has underlying advanced dementia with behavioral disturbance.  Mental status seems to have improved.  She was alert and awake this morning.Communicates.  We will continue to monitor her mental status.  No focal neurological deficits.  Acute kidney injury on CKD stage 4: In the  setting of UTI, CHF.  Patient was also on ARB at home.  On reviewing her records, her last serum creatinine was 3.8 on 12/29/16   at outside facility.  Kidney function deteriorating. Nephrology evaluation requested today. Ultrasound of the kidneys are unremarkable.  Patient also  has Foley catheter.  Has poor urinary output.  She has been treated with IV Lasix and metolazone. We have been trying to contact patient's family without success in order to address the goals of care. Palliative care also following.  For now we will closely monitor the urine output, kidney function and electrolytes. Also started on sodium bicarb tablets.  Elevated troponin/non-STEMI: Cardiology was following.  Suspect supply-demand ischemia , likely associated with AK I, CHF.  Cardiology recommended  medical management.  Cardiology signed off. Patient has EF of 20% which is new .Also has elevated troponin and lateral T wave changes in EKG.  BNP of more than 4500 and chest x-ray with bilateral small pleural effusions.  Ccommended palliative care and supportive care. Unable to anticoagulate because of fecal occult blood test positive.  Denies chest pain.   Acute lower UTI: Urine culture negative.  Treated with 3 days of ceftriaxone.  Essential hypertension: Continue current meds.  Currently blood pressure stable.  We will continue to monitor the blood pressure.  History of Diabetes mellitus: Mild DKA on admission.  DKA resolved.  Lantus discontinued due to hypoglycemia.  Continue sliding-scale insulin  Normocytic anemia: Fecal occult blood test was positive.  Unknown if patient has GI bleed.  GI eval to the patient and recommended conservative management .Continue Protonix.  We will continue to monitor CBC  Deconditioning/debility/poor prognosis: Palliative care following.  Palliative care also trying to contact the family members regarding discussion for goals of care.  So far unsuccessful.    DVT prophylaxis: SCD Code Status: Full code Family Communication: No Family at the bedside Disposition Plan: Currently in the stepdown.  Will be transferred to telemetry   Consultants: Cardiology, GI, palliative care, nephrology  Procedures: None   Antimicrobials: Ceftriaxone Completed on  3/29  Subjective: Patient seen and examined at the bedside this morning.  Looks comfortable.  Saturating fine on room air.  Currently he is alert and awake.  Oriented to self.  Communicates and gives answers.  Denies any problems  Objective: Vitals:   01/25/18 0700 01/25/18 0800 01/25/18 1000 01/25/18 1200  BP:   (!) 117/53   Pulse: 74 74 71   Resp: 12 18 16    Temp:  (!) 97.5 F (36.4 C)  97.8 F (36.6 C)  TempSrc:  Oral  Oral  SpO2: 97% 97% 97%   Weight:      Height:        Intake/Output Summary (Last 24 hours) at 01/25/2018 1314 Last data filed at 01/25/2018 0815 Gross per 24 hour  Intake 120 ml  Output 725 ml  Net -605 ml   Filed Weights   01/23/18 0500 01/24/18 0500 01/25/18 0500  Weight: 69.2 kg (152 lb 8.9 oz) 69.8 kg (153 lb 14.1 oz) 69.3 kg (152 lb 12.5 oz)    Examination:  General exam: Appears  comfortable ,Not in distress, chronically ill elderly female HEENT:PERRL,Oral mucosa moist, Ear/Nose normal on gross exam Respiratory system: Bilateral equal air entry, normal vesicular breath sounds, no wheezes or crackles  Cardiovascular system: S1 & S2 heard, RRR. No JVD, murmurs, rubs, gallops or clicks. No pedal edema. Gastrointestinal system: Abdomen is nondistended, soft and nontender. No organomegaly or masses felt. Normal bowel sounds heard. Central nervous system: Alert but not fully oriented. No focal neurological deficits. Extremities: No edema, no clubbing ,no cyanosis, distal peripheral pulses palpable. Skin: No rashes, lesions or ulcers,no icterus ,no pallor MSK: Normal muscle bulk,tone ,power   Data Reviewed: I have personally reviewed following labs and imaging studies  CBC: Recent Labs  Lab 01/21/18 1111  01/21/18 2347 01/22/18 0325 01/23/18 0350 01/24/18 0310 01/25/18 0339  WBC 7.0  --   --  8.6 7.6 7.6 7.1  NEUTROABS 5.5  --   --   --   --   --   --   HGB 8.6*   < > 8.5* 8.6* 8.4* 8.7* 8.7*  HCT 25.6*   < > 25.2* 25.0* 24.8* 25.9* 26.3*   MCV 89.2  --   --  89.0 89.9 90.6 89.5  PLT 299  --   --  226 216 206 216   < > = values in this interval not displayed.   Basic Metabolic Panel: Recent Labs  Lab 01/22/18 0854 01/23/18 0350 01/23/18 2142 01/24/18 0310 01/25/18 0339  NA 139 140 137 138 136  K 3.9 3.7 3.3* 3.3* 3.5  CL 105 108 106 106 103  CO2 18* 21* 21* 20* 19*  GLUCOSE 197* 101* 163* 96 115*  BUN 57* 56* 60* 63* 63*  CREATININE 5.62* 6.06* 6.51* 6.73* 7.20*  CALCIUM 8.0* 7.7* 7.8* 7.8* 7.8*  MG  --  2.0 2.1 2.1  --   PHOS  --  3.6  --  4.1 4.3   GFR: Estimated Creatinine Clearance: 6 mL/min (A) (by C-G formula based on SCr of 7.2 mg/dL (H)). Liver Function Tests: Recent Labs  Lab 01/21/18 1111 01/23/18 0350 01/23/18 2142 01/24/18 0310 01/25/18 0339  AST 74*  --  75*  --   --   ALT 49  --  75*  --   --   ALKPHOS 111  --  92  --   --   BILITOT 1.8*  --  0.8  --   --   PROT 7.4  --  6.0*  --   --   ALBUMIN 3.8 2.6* 2.8* 2.6* 2.8*   No results for input(s): LIPASE, AMYLASE in the last 168 hours. No results for input(s): AMMONIA in the last 168 hours. Coagulation Profile: Recent Labs  Lab 01/21/18 2124  INR 1.39   Cardiac Enzymes: Recent Labs  Lab 01/21/18 1111 01/21/18 1726 01/21/18 2124 01/22/18 0325 01/22/18 0854  TROPONINI 0.19* 0.21* 0.21* 0.19* 0.19*   BNP (last 3 results) No results for input(s): PROBNP in the last 8760 hours. HbA1C: No results for input(s): HGBA1C in the last 72 hours. CBG: Recent Labs  Lab 01/24/18 2015 01/25/18 0037 01/25/18 0422 01/25/18 0739 01/25/18 1158  GLUCAP 181* 115* 119* 115* 200*   Lipid Profile: No results for input(s): CHOL, HDL, LDLCALC, TRIG, CHOLHDL, LDLDIRECT in the last 72 hours. Thyroid Function Tests: No results for input(s): TSH, T4TOTAL, FREET4, T3FREE, THYROIDAB in the last 72 hours. Anemia Panel: Recent Labs    01/23/18 0350  FERRITIN 206  TIBC 189*  IRON 35   Sepsis Labs: Recent Labs  Lab 01/21/18 1111  01/21/18 1530  LATICACIDVEN 2.5* 2.5*    Recent Results (from the past 240 hour(s))  Culture, Urine     Status: None   Collection Time: 01/21/18 12:52 PM  Result Value Ref Range Status   Specimen Description   Final    URINE, RANDOM Performed at [facility], [address], [place], [place] [address]    Special Requests   Final    NONE Performed at [facility], [address] [place], [place] [address]    Culture   Final    NO GROWTH Performed at [facility], [address] [place], [place] [address]    Report Status 01/23/2018 FINAL  Final  MRSA PCR Screening     Status: None   Collection Time: 01/21/18  9:15 PM  Result Value Ref Range Status   MRSA by PCR NEGATIVE NEGATIVE Final    Comment:        The GeneXpert MRSA Assay (FDA approved for NASAL specimens only), is one component of a comprehensive MRSA colonization surveillance program. It is not intended to diagnose MRSA infection nor to guide or monitor treatment for MRSA infections. Performed at [facility], [address], [place], [place] [address]          Radiology Studies: No results found.      Scheduled Meds: . carvedilol  6.25 mg Oral BID WC  . feeding supplement (ENSURE ENLIVE)  237 mL Oral Q24H  . furosemide  40 mg Oral BID  . insulin aspart  0-9 Units Subcutaneous Q4H  . isosorbide-hydrALAZINE  1 tablet Oral TID  . pravastatin  80 mg Oral BH-[id number]  . sodium bicarbonate  650 mg Oral BID  . sodium chloride flush  3 mL Intravenous Q12H   Continuous Infusions:   LOS: 4 days    Time spent: 30 mins.     Burnadette Pop, MD [facility] Pager [phone]  If 7PM-7AM, please contact night-coverage [email or web address] Password [email or web address] 01/25/2018, 1:14 PM

## 2018-01-25 NOTE — Consult Note (Signed)
[facility] Consult Note     Date: 01/25/2018                  Patient Name:  Tammy Huff  MRN: [id number]  DOB: July 22, 1941  Age / Sex: 77 y.o., female         PCP: Dorena Dew, FNP                 Service Requesting Consult: [facility]                 Reason for Consult: AKI on CKD            Chief Complaint: stopping eating and drinking, confusion  HPI: Pt is a 34F with advanced dementia who we are asked to consult on for AKI on CKD.  History provided by the pt's dtr at bedside.  Pt stopped eating and drinking last week and became confused.    She was brought here where she was found to have a Cr of 7.2.  Baseline appears to be in the mid-1s 2017 --> 3.8 12/2017.  Pt appears comfortable and happy and not in any pain.  She is able to recognize her dtr.  Her dtr states clearly that they have discussed dialysis before and do not want to pursue it.  Renal US 3/27 with small kidneys and no hydro.    Past Medical History:  Diagnosis Date  . Arthritis   . Dementia    advanced  . Diabetes mellitus without complication ([place])   . Diverticulitis   . Hypertension     Past Surgical History:  Procedure Laterality Date  . COLON RESECTION      Family History  Family history unknown: Yes   Social History:  reports that she has never smoked. She has never used smokeless tobacco. She reports that she does not drink alcohol or use drugs.  Allergies: No Known Allergies  Medications Prior to Admission  Medication Sig Dispense Refill  . amLODipine (NORVASC) 10 MG tablet Take 10 mg by mouth every evening.    Marland Kitchen aspirin EC 325 MG EC tablet Take 1 tablet (325 mg total) by mouth daily. 30 tablet 3  . carvedilol (COREG) 6.25 MG tablet Take 1 tablet (6.25 mg total) by mouth 2 (two) times daily with a meal. Reported on 12/26/2015 60 tablet 0  . cloNIDine (CATAPRES) 0.2 MG tablet Take 1 tablet (0.2 mg total) by mouth 2 (two) times daily. 60 tablet 0  .  diphenhydrAMINE (BENADRYL) 25 MG tablet Take 25 mg by mouth daily as needed for allergies.     . famotidine (PEPCID AC) 10 MG chewable tablet Chew 10 mg by mouth every morning.     . hydrALAZINE (APRESOLINE) 25 MG tablet Take 25 mg by mouth 3 (three) times daily.    . Iron-Vitamins (GERITOL PO) Take 1 tablet by mouth every morning.    Marland Kitchen losartan-hydrochlorothiazide (HYZAAR) 100-25 MG tablet Take 1 tablet by mouth daily. 30 tablet 0  . pravastatin (PRAVACHOL) 80 MG tablet Take 1 tablet (80 mg total) by mouth every morning. 30 tablet 0  . TRULICITY 1.5 XN/1.7GY SOPN Inject 0.5 mLs as directed once a week.  1  . insulin aspart (NOVOLOG FLEXPEN) 100 UNIT/ML FlexPen Inject 4 Units into the skin 3 (three) times daily with meals. 15 mL 0  . Insulin Glargine (LANTUS SOLOSTAR) 100 UNIT/ML Solostar Pen Inject 40 Units into the skin daily at 10 pm. 15 mL 0  .  Insulin Pen Needle (RELION PEN NEEDLE 31G/8MM) 31G X 8 MM MISC Use as directed. 100 each 2    Results for orders placed or performed during the hospital encounter of 01/21/18 (from the past 48 hour(s))  Glucose, capillary     Status: None   Collection Time: 01/23/18  4:25 PM  Result Value Ref Range   Glucose-Capillary 82 65 - 99 mg/dL  Glucose, capillary     Status: Abnormal   Collection Time: 01/23/18  7:16 PM  Result Value Ref Range   Glucose-Capillary 172 (H) 65 - 99 mg/dL  Comprehensive metabolic panel     Status: Abnormal   Collection Time: 01/23/18  9:42 PM  Result Value Ref Range   Sodium 137 135 - 145 mmol/L   Potassium 3.3 (L) 3.5 - 5.1 mmol/L   Chloride 106 101 - 111 mmol/L   CO2 21 (L) 22 - 32 mmol/L   Glucose, Bld 163 (H) 65 - 99 mg/dL   BUN 60 (H) 6 - 20 mg/dL   Creatinine, Ser 6.51 (H) 0.44 - 1.00 mg/dL   Calcium 7.8 (L) 8.9 - 10.3 mg/dL   Total Protein 6.0 (L) 6.5 - 8.1 g/dL   Albumin 2.8 (L) 3.5 - 5.0 g/dL   AST 75 (H) 15 - 41 U/L   ALT 75 (H) 14 - 54 U/L   Alkaline Phosphatase 92 38 - 126 U/L   Total Bilirubin 0.8 0.3  - 1.2 mg/dL   GFR calc non Af Amer 5 (L) >60 mL/min   GFR calc Af Amer 6 (L) >60 mL/min    Comment: (NOTE) The eGFR has been calculated using the CKD EPI equation. This calculation has not been validated in all clinical situations. eGFR's persistently <60 mL/min signify possible Chronic Kidney Disease.    Anion gap 10 5 - 15    Comment: Performed at [facility], [place] [address], [place], [place] [address]  Magnesium     Status: None   Collection Time: 01/23/18  9:42 PM  Result Value Ref Range   Magnesium 2.1 1.7 - 2.4 mg/dL    Comment: Performed at [facility], [place] [address] [place], [place] [address]  Glucose, capillary     Status: Abnormal   Collection Time: 01/23/18 11:15 PM  Result Value Ref Range   Glucose-Capillary 105 (H) 65 - 99 mg/dL  CBC     Status: Abnormal   Collection Time: 01/24/18  3:10 AM  Result Value Ref Range   WBC 7.6 4.0 - 10.5 K/uL   RBC 2.86 (L) 3.87 - 5.11 MIL/uL   Hemoglobin 8.7 (L) 12.0 - 15.0 g/dL   HCT 25.9 (L) 36.0 - 46.0 %   MCV 90.6 78.0 - 100.0 fL   MCH 30.4 26.0 - 34.0 pg   MCHC 33.6 30.0 - 36.0 g/dL   RDW 15.0 11.5 - 15.5 %   Platelets 206 150 - 400 K/uL    Comment: Performed at [facility], [place] [address] [place], [place] [address]  Renal function panel     Status: Abnormal   Collection Time: 01/24/18  3:10 AM  Result Value Ref Range   Sodium 138 135 - 145 mmol/L   Potassium 3.3 (L) 3.5 - 5.1 mmol/L   Chloride 106 101 - 111 mmol/L   CO2 20 (L) 22 - 32 mmol/L   Glucose, Bld 96 65 - 99 mg/dL   BUN 63 (H) 6 - 20 mg/dL   Creatinine, Ser 6.73 (  H) 0.44 - 1.00 mg/dL   Calcium 7.8 (L) 8.9 - 10.3 mg/dL   Phosphorus 4.1 2.5 - 4.6 mg/dL   Albumin 2.6 (L) 3.5 - 5.0 g/dL   GFR calc non Af Amer 5 (L) >60 mL/min   GFR calc Af Amer 6 (L) >60 mL/min    Comment: (NOTE) The eGFR has been calculated using the CKD EPI equation. This calculation has not been validated in all clinical  situations. eGFR's persistently <60 mL/min signify possible Chronic Kidney Disease.    Anion gap 12 5 - 15    Comment: Performed at [facility], [place] [address] [place], [place] [address]  Magnesium     Status: None   Collection Time: 01/24/18  3:10 AM  Result Value Ref Range   Magnesium 2.1 1.7 - 2.4 mg/dL    Comment: Performed at [facility], [place] [address], [place], [place] [address]  Glucose, capillary     Status: Abnormal   Collection Time: 01/24/18  3:39 AM  Result Value Ref Range   Glucose-Capillary 101 (H) 65 - 99 mg/dL  Glucose, capillary     Status: None   Collection Time: 01/24/18  7:38 AM  Result Value Ref Range   Glucose-Capillary 80 65 - 99 mg/dL  Glucose, capillary     Status: Abnormal   Collection Time: 01/24/18 11:53 AM  Result Value Ref Range   Glucose-Capillary 164 (H) 65 - 99 mg/dL   Comment 1 Notify RN    Comment 2 Document in Chart   Glucose, capillary     Status: Abnormal   Collection Time: 01/24/18  3:52 PM  Result Value Ref Range   Glucose-Capillary 158 (H) 65 - 99 mg/dL   Comment 1 Notify RN    Comment 2 Document in Chart   Glucose, capillary     Status: Abnormal   Collection Time: 01/24/18  5:51 PM  Result Value Ref Range   Glucose-Capillary 176 (H) 65 - 99 mg/dL  Glucose, capillary     Status: Abnormal   Collection Time: 01/24/18  8:15 PM  Result Value Ref Range   Glucose-Capillary 181 (H) 65 - 99 mg/dL   Comment 1 Notify RN    Comment 2 Document in Chart   Glucose, capillary     Status: Abnormal   Collection Time: 01/25/18 12:37 AM  Result Value Ref Range   Glucose-Capillary 115 (H) 65 - 99 mg/dL   Comment 1 Notify RN    Comment 2 Document in Chart   CBC     Status: Abnormal   Collection Time: 01/25/18  3:39 AM  Result Value Ref Range   WBC 7.1 4.0 - 10.5 K/uL   RBC 2.94 (L) 3.87 - 5.11 MIL/uL   Hemoglobin 8.7 (L) 12.0 - 15.0 g/dL   HCT 26.3 (L) 36.0 - 46.0 %   MCV 89.5 78.0 - 100.0 fL   MCH  29.6 26.0 - 34.0 pg   MCHC 33.1 30.0 - 36.0 g/dL   RDW 15.1 11.5 - 15.5 %   Platelets 216 150 - 400 K/uL    Comment: Performed at [facility], [place] [address] [place], [place] [address]  Renal function panel     Status: Abnormal   Collection Time: 01/25/18  3:39 AM  Result Value Ref Range   Sodium 136 135 - 145 mmol/L   Potassium 3.5 3.5 - 5.1 mmol/L   Chloride 103 101 - 111 mmol/L   CO2 19 (L) 22 -  32 mmol/L   Glucose, Bld 115 (H) 65 - 99 mg/dL   BUN 63 (H) 6 - 20 mg/dL   Creatinine, Ser 7.20 (H) 0.44 - 1.00 mg/dL   Calcium 7.8 (L) 8.9 - 10.3 mg/dL   Phosphorus 4.3 2.5 - 4.6 mg/dL   Albumin 2.8 (L) 3.5 - 5.0 g/dL   GFR calc non Af Amer 5 (L) >60 mL/min   GFR calc Af Amer 6 (L) >60 mL/min    Comment: (NOTE) The eGFR has been calculated using the CKD EPI equation. This calculation has not been validated in all clinical situations. eGFR's persistently <60 mL/min signify possible Chronic Kidney Disease.    Anion gap 14 5 - 15    Comment: Performed at [facility], [place] [address] [place], [place] [address]  Glucose, capillary     Status: Abnormal   Collection Time: 01/25/18  4:22 AM  Result Value Ref Range   Glucose-Capillary 119 (H) 65 - 99 mg/dL   Comment 1 Notify RN    Comment 2 Document in Chart   Glucose, capillary     Status: Abnormal   Collection Time: 01/25/18  7:39 AM  Result Value Ref Range   Glucose-Capillary 115 (H) 65 - 99 mg/dL   Comment 1 Notify RN    Comment 2 Document in Chart   Glucose, capillary     Status: Abnormal   Collection Time: 01/25/18 11:58 AM  Result Value Ref Range   Glucose-Capillary 200 (H) 65 - 99 mg/dL   No results found.  ROS : Pt unable to provide history  Blood pressure (!) 132/57, pulse 71, temperature 97.8 F (36.6 C), temperature source Oral, resp. rate 12, height 5' 6"  (1.676 m), weight 69.3 kg (152 lb 12.5 oz), SpO2 97 %. Physical Exam  GEN elderly, NAD HEENT EOMI, sclerae  anicteric PULM normal WOB CV: regular pulse SKIN warm and dry NEURO: able to recognize dtr  Assessment/Plan  1.  AKI on CKD IV: likely due to ATN in the setting of poor PO intake.  Has underlying CKD due to likely HTN DM II and advanced age.  We will not offer dialysis.  I discussed in detail with dtr the natural history of chronic kidney disease and ATN.  Palliative care is set to meet with family today.  Pt's dtr has questions re: type of optimal care for mother to receive.  We briefly discussed home hospice vs SNF with hospice services.  I'm not sure if her renal function will recover at this point but what is reassuring is that she's not terribly oliguric and made ~500 mL urine yesterday.  Would not fluid restrict or diet restrict pt.  Avoid all nephrotoxic agents. Would d/c the Lasix if PO intake remains poor as it will exacerbate ATN if she goes home on it.  Continue bicarb.   We will sign off.  Please call with questions or concerns.   Madelon Lips, MD [facility] pgr [phone] 01/25/2018, 2:09 PM

## 2018-01-25 NOTE — Progress Notes (Signed)
Patient ID: Tammy Huff, female   DOB: 10/27/1941, 77 y.o.   MRN: [id number]  Patient's renal function has worsened overnight. Case discussed with nephrology, who are now involved.   I met with patient's daughter, Tammy Huff, who is patient's primary caregiver and HCPOA. I reviewed patient's HCPOA and living will documents, which were brought to the hospital by the daughter. Daughter has met today with nephrology and decided against hemodialysis. Reportedly, patient had a previous instance of A/CKD in the past and informed family that she would not want dialysis. Daughter says that family are understanding of the potential for decline and death and are ok if it occurs. They want to ensure that patient is comfortable. We talked about code status and daughter says clearly that patient would not want to be resuscitated. DNR order entered.   Prior to this hospitalization, patient was living at home with daughter and granddaughter. At baseline, patient is mostly in a wheelchair and pushes herself around the house but can apparently stand independently when required. Patient requires assistance with ADLs but can feed herself. Family are not expecting patient to return to her previous baseline and do not think they would be able to continue care at home. They are interested in placement. We talked about option of hospice at SNF or residential hospice in the event of further decline. If patient were discharged to rehab, I would recommend palliative care follow.   Plan: 1. DNR 2. No hemodialysis 3. Would recommend residential hospice in the event of further decline in renal function or clinical status.  4. Would recommend palliative care follow if patient is discharged to rehab 5. Will follow  Time: 45 minutes

## 2018-01-26 LAB — GASTROINTESTINAL PANEL BY PCR, STOOL (REPLACES STOOL CULTURE)
ADENOVIRUS F40/41: NOT DETECTED
ASTROVIRUS: NOT DETECTED
CAMPYLOBACTER SPECIES: NOT DETECTED
CRYPTOSPORIDIUM: NOT DETECTED
CYCLOSPORA CAYETANENSIS: NOT DETECTED
ENTEROTOXIGENIC E COLI (ETEC): NOT DETECTED
Entamoeba histolytica: NOT DETECTED
Enteroaggregative E coli (EAEC): NOT DETECTED
Enteropathogenic E coli (EPEC): NOT DETECTED
Giardia lamblia: NOT DETECTED
Norovirus GI/GII: NOT DETECTED
PLESIMONAS SHIGELLOIDES: NOT DETECTED
ROTAVIRUS A: NOT DETECTED
SAPOVIRUS (I, II, IV, AND V): NOT DETECTED
SHIGA LIKE TOXIN PRODUCING E COLI (STEC): NOT DETECTED
Salmonella species: NOT DETECTED
Shigella/Enteroinvasive E coli (EIEC): NOT DETECTED
VIBRIO SPECIES: NOT DETECTED
Vibrio cholerae: NOT DETECTED
YERSINIA ENTEROCOLITICA: NOT DETECTED

## 2018-01-26 LAB — BASIC METABOLIC PANEL
ANION GAP: 12 (ref 5–15)
BUN: 65 mg/dL — ABNORMAL HIGH (ref 6–20)
CO2: 22 mmol/L (ref 22–32)
Calcium: 7.7 mg/dL — ABNORMAL LOW (ref 8.9–10.3)
Chloride: 103 mmol/L (ref 101–111)
Creatinine, Ser: 7.48 mg/dL — ABNORMAL HIGH (ref 0.44–1.00)
GFR calc non Af Amer: 5 mL/min — ABNORMAL LOW (ref >60)
GFR, EST AFRICAN AMERICAN: 5 mL/min — AB (ref >60)
Glucose, Bld: 123 mg/dL — ABNORMAL HIGH (ref 65–99)
POTASSIUM: 3.4 mmol/L — AB (ref 3.5–5.1)
Sodium: 137 mmol/L (ref 135–145)

## 2018-01-26 LAB — GLUCOSE, CAPILLARY
GLUCOSE-CAPILLARY: 122 mg/dL — AB (ref 65–99)
GLUCOSE-CAPILLARY: 123 mg/dL — AB (ref 65–99)
GLUCOSE-CAPILLARY: 210 mg/dL — AB (ref 65–99)
GLUCOSE-CAPILLARY: 228 mg/dL — AB (ref 65–99)
GLUCOSE-CAPILLARY: 91 mg/dL (ref 65–99)
Glucose-Capillary: 191 mg/dL — ABNORMAL HIGH (ref 65–99)

## 2018-01-26 MED ORDER — INSULIN ASPART 100 UNIT/ML ~~LOC~~ SOLN
0.0000 [IU] | Freq: Three times a day (TID) | SUBCUTANEOUS | Status: DC
  Administered 2018-01-27: 3 [IU] via SUBCUTANEOUS
  Administered 2018-01-27: 1 [IU] via SUBCUTANEOUS
  Administered 2018-01-27: 3 [IU] via SUBCUTANEOUS

## 2018-01-26 NOTE — Progress Notes (Signed)
[facility] -- [facility] RN Note  Received request from Abigail ButtsSusan Porter, LCSW for [facility]. Chart reviewed and attempted to speak with family to acknowledge referral. Unfortunately, [facility] does not have a room to offer today. Family and CSW aware [facility] will follow up tomorrow if room becomes available.   I was informed daughter was coming to the hospital and to speak with her regarding referral. She did not arrive during the afternoon hours. I did attempt to reach her multiple times by phone without success. I did leave a message informing her of room unavailability today and left the number to reach [facility] if needed at [phone].  Please do not hesitate to call with any questions.  Thank you, Haynes Bastracy Ennis, RN, BSN [facility] [phone]  [facility] are on AMION.

## 2018-01-26 NOTE — Progress Notes (Signed)
Daily Progress Note   Patient Name: Tammy Huff       Date: 01/26/2018 DOB: June 02, 1941  Age: 77 y.o. MRN#: [id number] Attending Physician: Burnadette Pop, MD Primary Care Physician: Massie Maroon, FNP Admit Date: 01/21/2018  Reason for Consultation/Follow-up: Establishing goals of care  Subjective: Patient generally appears more confused and uncomfortable today but does not elucidate on specifics. CNA was trying to feed her but she was pocketing food. Only eating sips/bites.   SCr worse.   Length of Stay: 5  Current Medications: Scheduled Meds:  . carvedilol  6.25 mg Oral BID WC  . feeding supplement (ENSURE ENLIVE)  237 mL Oral Q24H  . insulin aspart  0-9 Units Subcutaneous Q4H  . isosorbide-hydrALAZINE  1 tablet Oral TID  . pravastatin  80 mg Oral BH-q7a  . sodium bicarbonate  650 mg Oral BID  . sodium chloride flush  3 mL Intravenous Q12H    Continuous Infusions:   PRN Meds: dextrose, nitroGLYCERIN, ondansetron (ZOFRAN) IV  Physical Exam          Vital Signs: BP 112/60 (BP Location: Left Arm)   Pulse 65   Temp 98.5 F (36.9 C) (Oral)   Resp 20   Ht 5\' 6"  (1.676 m)   Wt 71.2 kg (157 lb)   SpO2 98%   BMI 25.34 kg/m  SpO2: SpO2: 98 % O2 Device: O2 Device: Room Air O2 Flow Rate: O2 Flow Rate (L/min): 2 L/min  Intake/output summary:   Intake/Output Summary (Last 24 hours) at 01/26/2018 1239 Last data filed at 01/26/2018 1100 Gross per 24 hour  Intake 180 ml  Output 675 ml  Net -495 ml   LBM: Last BM Date: 01/25/18 Baseline Weight: Weight: 98 kg (216 lb) Most recent weight: Weight: 71.2 kg (157 lb)       Palliative Assessment/Data:    Flowsheet Rows     Most Recent Value  Intake Tab  Referral Department  Hospitalist  Unit at Time of Referral   Med/Surg Unit  Palliative Care Primary Diagnosis  Neurology  Date Notified  01/22/18  Palliative Care Type  Return patient Palliative Care  Reason for referral  Clarify Goals of Care  Date of Admission  01/21/18  Date first seen by Palliative Care  01/22/18  # of days Palliative referral response time  0 Day(s)  # of days IP prior to Palliative referral  1  Clinical Assessment  Psychosocial & Spiritual Assessment  Palliative Care Outcomes      Patient Active Problem List   Diagnosis Date Noted  . Advance care planning   . Acute systolic CHF (congestive heart failure) (HCC)   . Anemia   . Goals of care, counseling/discussion   . Palliative care by specialist   . Acute encephalopathy 01/21/2018  . GI bleeding 01/21/2018  . Hypoxia 01/21/2018  . Acute lower UTI 01/21/2018  . DKA (diabetic ketoacidosis) (HCC) 01/21/2018  . NSTEMI (non-ST elevated myocardial infarction) (HCC) 01/21/2018  . Palliative care encounter   . Transient ischemia 04/25/2016  . CKD (chronic kidney disease) 04/25/2016  . Dementia with behavioral disturbance 04/25/2016  . GERD (gastroesophageal reflux disease) 04/25/2016  . Slurred speech 04/25/2016  . Diabetes mellitus with complication (HCC)   . Counseling regarding goals of care   . Diabetes mellitus (HCC) 12/26/2015  . DM hyperosmolarity type II, uncontrolled (HCC) 11/26/2014  . Essential hypertension 11/26/2014  . Dehydration 11/26/2014  . Renal failure, acute on chronic (HCC) 11/26/2014  . Vertigo 08/12/2014  . DM2 (diabetes mellitus, type 2) (HCC) 08/12/2014  . HTN (hypertension) 08/12/2014  . HLD (hyperlipidemia) 08/12/2014    Palliative Care Assessment & Plan   Patient Profile: 77 yo woman with Alzheimer's dementia, diverticulosis, T2DM, CKD III, and HTN, who was admitted on 01/21/18 for AMS and was found to have A/CKD, UTI, and NSTEMI. Hospitalization has been complicated by worsening renal function.   Assessment: I called and spoke with  patient's daughter, Tammy Huff, who is pt's HCPOA. Updated her on pt's status. We talked again about hospice involvement and daughter was agreeable to comfort care and transfer to inpatient hospice. [facility].   Recommendations/Plan:  Hospice referral   Care plan was discussed with family, nurse, attending  Thank you for allowing the Palliative Medicine Team to assist in the care of this patient.   Time In: 1215 Time Out: 1245 Total Time 30 minutes Prolonged Time Billed  NO      Greater than 50%  of this time was spent counseling and coordinating care related to the above assessment and plan.  Malachy MoanJOSHUA R Labrea Eccleston, NP  Please contact Palliative Medicine Team phone at [phone] for questions and concerns.

## 2018-01-26 NOTE — Progress Notes (Signed)
Took over patient care from [place]-RN no new changes from prior assessment. Will continue to follow-up with plan of care.  

## 2018-01-26 NOTE — Progress Notes (Signed)
CSW received call from [place], [facility], [facility]. [place] An[place] has been unable to reach family today for meeting regarding [facility]. [place] [facility] also indicated a [facility] is not available [facility] today, but [facility] will follow up with family again Monday, 02/10/2018. CSW called patient's daughter, [name], and left voicemail message regarding disposition plan. CSW will follow and support with discharge [facility] when bed available.  Abigail ButtsSusan Taela Charbonneau, LCSWA [phone]

## 2018-01-26 NOTE — Progress Notes (Addendum)
PROGRESS NOTE    Tammy Huff  ZOX:[id number] DOB: 06/19/1941 DOA: 01/21/2018 PCP: Massie MaroonHollis, Lachina M, FNP   Brief Narrative: Patient is a 77 year old female with past medical history of advanced Alzheimer's dementia, diverticulosis, type 2 diabetes mellitus, stage III CKD, hypertension who was brought to the emergency department for multiple complaints. Patient was found to be confused on presentation.  Patient was also found to have elevated troponin, worsening kidney function, hyperglycemia with elevated anion gap.  Also found to have urinary tract infectinon .Her FOBT was also positive.  GI and cardiology were consulted. Her creatinine is worsening so nephrology also consulted.   Assessment & Plan:   Principal Problem:   Acute encephalopathy Active Problems:   DM hyperosmolarity type II, uncontrolled (HCC)   Essential hypertension   Dehydration   Renal failure, acute on chronic (HCC)   Dementia with behavioral disturbance   GI bleeding   Hypoxia   Acute lower UTI   DKA (diabetic ketoacidosis) (HCC)   NSTEMI (non-ST elevated myocardial infarction) (HCC)   Anemia   Goals of care, counseling/discussion   Palliative care by specialist   Advance care planning   Acute systolic CHF (congestive heart failure) (HCC)  Acute metabolic encephalopathy: Probably secondary to urinary tract infection, uremia.  Patient also has underlying advanced dementia with behavioral disturbance.    We will continue to monitor her mental status.  No focal neurological deficits.She remains confused this morning.  Acute kidney injury on CKD stage 4: In the  setting of poor oral intake,UTI, CHF.  Patient was also on ARB at home.  On reviewing her records, her last serum creatinine was 3.8 on 12/29/16   at outside facility.  Kidney function deteriorating. Nephrology evaluation done. Ultrasound of the kidneys are unremarkable.  Patient also has Foley catheter.  Has poor urinary output.  She was treated with IV  Lasix and metolazone. Palliative care also following.   Also started on sodium bicarb tablets. Patient is not a dialysis candidate. Since her kidney function is worsening and patient continues to have poor oral intake, will discontinue Lasix.  Elevated troponin/non-STEMI: Cardiology was following.  Suspect supply-demand ischemia , likely associated with AK I, CHF.  Cardiology recommended  medical management.  Cardiology signed off. Patient has EF of 20% which is new .Also has elevated troponin and lateral T wave changes in EKG.  BNP of more than 4500 and chest x-ray with bilateral small pleural effusions.  Recommended palliative care and supportive care. Unable to anticoagulate because of fecal occult blood test positive.  Denies chest pain.   Acute lower UTI: Urine culture negative.  Treated with 3 days of ceftriaxone.  Essential hypertension: Continue current meds.  Currently blood pressure stable.  We will continue to monitor the blood pressure.  History of Diabetes mellitus: Mild DKA on admission.  DKA resolved.  Lantus discontinued due to hypoglycemia.  Continue sliding-scale insulin  Normocytic anemia: Fecal occult blood test was positive.  Unknown if patient has GI bleed.  GI evaluated the patient and recommended conservative management .Continue Protonix.   Deconditioning/debility/poor prognosis: Palliative care following.  Talked to daughter regarding her worsening kidney function.  Patient has been  made DNR/DNI .Plan is to transition her care to hospice if kidney function continues to deteriorate.    DVT prophylaxis: SCD Code Status: Full code Family Communication: Called daughter on listed phone number,no answer Disposition Plan:  Likely residential hospice  Consultants: Cardiology, GI, palliative care, nephrology  Procedures: None   Antimicrobials: Ceftriaxone  Completed on 3/29  Subjective: Patient seen and examined the bedside this morning.  Found to be sleeping.  Did  not want to communicate today.  Remains confused.  Says" do not disturb me".  Objective: Vitals:   01/25/18 1400 01/25/18 1637 01/25/18 2037 01/26/18 0420  BP: (!) 106/50 107/62 115/62 112/60  Pulse: 65 75 72 65  Resp: (!) 21 20 20 20   Temp:  97.9 F (36.6 C) 98.2 F (36.8 C) 98.5 F (36.9 C)  TempSrc:  Oral Oral Oral  SpO2: 98% 98% 98% 98%  Weight:    71.2 kg (157 lb)  Height:        Intake/Output Summary (Last 24 hours) at 01/26/2018 1022 Last data filed at 01/26/2018 0420 Gross per 24 hour  Intake -  Output 675 ml  Net -675 ml   Filed Weights   01/24/18 0500 01/25/18 0500 01/26/18 0420  Weight: 69.8 kg (153 lb 14.1 oz) 69.3 kg (152 lb 12.5 oz) 71.2 kg (157 lb)    Examination:  General exam: Appears  comfortable ,Not in distress, chronically ill elderly female HEENT:PERRL,Oral mucosa moist, Ear/Nose normal on gross exam Respiratory system: Bilateral basal crackles Cardiovascular system: S1 & S2 heard, RRR. No JVD, murmurs, rubs, gallops or clicks. No pedal edema. Gastrointestinal system: Abdomen is nondistended, soft and nontender. No organomegaly or masses felt. Normal bowel sounds heard. Central nervous system: Not alert or oriented today. No focal neurological deficits. Extremities: No edema, no clubbing ,no cyanosis, distal peripheral pulses palpable. Skin: No rashes, lesions or ulcers,no icterus ,no pallor MSK: Normal muscle bulk,tone ,power   Data Reviewed: I have personally reviewed following labs and imaging studies  CBC: Recent Labs  Lab 01/21/18 1111  01/21/18 2347 01/22/18 0325 01/23/18 0350 01/24/18 0310 01/25/18 0339  WBC 7.0  --   --  8.6 7.6 7.6 7.1  NEUTROABS 5.5  --   --   --   --   --   --   HGB 8.6*   < > 8.5* 8.6* 8.4* 8.7* 8.7*  HCT 25.6*   < > 25.2* 25.0* 24.8* 25.9* 26.3*  MCV 89.2  --   --  89.0 89.9 90.6 89.5  PLT 299  --   --  226 216 206 216   < > = values in this interval not displayed.   Basic Metabolic Panel: Recent Labs    Lab 01/23/18 0350 01/23/18 2142 01/24/18 0310 01/25/18 0339 01/26/18 0405  NA 140 137 138 136 137  K 3.7 3.3* 3.3* 3.5 3.4*  CL 108 106 106 103 103  CO2 21* 21* 20* 19* 22  GLUCOSE 101* 163* 96 115* 123*  BUN 56* 60* 63* 63* 65*  CREATININE 6.06* 6.51* 6.73* 7.20* 7.48*  CALCIUM 7.7* 7.8* 7.8* 7.8* 7.7*  MG 2.0 2.1 2.1  --   --   PHOS 3.6  --  4.1 4.3  --    GFR: Estimated Creatinine Clearance: 6.3 mL/min (A) (by C-G formula based on SCr of 7.48 mg/dL (H)). Liver Function Tests: Recent Labs  Lab 01/21/18 1111 01/23/18 0350 01/23/18 2142 01/24/18 0310 01/25/18 0339  AST 74*  --  75*  --   --   ALT 49  --  75*  --   --   ALKPHOS 111  --  92  --   --   BILITOT 1.8*  --  0.8  --   --   PROT 7.4  --  6.0*  --   --  ALBUMIN 3.8 2.6* 2.8* 2.6* 2.8*   No results for input(s): LIPASE, AMYLASE in the last 168 hours. No results for input(s): AMMONIA in the last 168 hours. Coagulation Profile: Recent Labs  Lab 01/21/18 2124  INR 1.39   Cardiac Enzymes: Recent Labs  Lab 01/21/18 1111 01/21/18 1726 01/21/18 2124 01/22/18 0325 01/22/18 0854  TROPONINI 0.19* 0.21* 0.21* 0.19* 0.19*   BNP (last 3 results) No results for input(s): PROBNP in the last 8760 hours. HbA1C: No results for input(s): HGBA1C in the last 72 hours. CBG: Recent Labs  Lab 01/25/18 1540 01/25/18 2035 01/26/18 0011 01/26/18 0414 01/26/18 0743  GLUCAP 224* 232* 228* 122* 91   Lipid Profile: No results for input(s): CHOL, HDL, LDLCALC, TRIG, CHOLHDL, LDLDIRECT in the last 72 hours. Thyroid Function Tests: No results for input(s): TSH, T4TOTAL, FREET4, T3FREE, THYROIDAB in the last 72 hours. Anemia Panel: No results for input(s): VITAMINB12, FOLATE, FERRITIN, TIBC, IRON, RETICCTPCT in the last 72 hours. Sepsis Labs: Recent Labs  Lab 01/21/18 1111 01/21/18 1530  LATICACIDVEN 2.5* 2.5*    Recent Results (from the past 240 hour(s))  Culture, Urine     Status: None   Collection Time:  01/21/18 12:52 PM  Result Value Ref Range Status   Specimen Description   Final    URINE, RANDOM Performed at [facility], [address] [place], [place] [address]    Special Requests   Final    NONE Performed at [facility], [address] [place], [place] [address]    Culture   Final    NO GROWTH Performed at [facility], [address], [place], [place] [address]    Report Status 01/23/2018 FINAL  Final  MRSA PCR Screening     Status: None   Collection Time: 01/21/18  9:15 PM  Result Value Ref Range Status   MRSA by PCR NEGATIVE NEGATIVE Final    Comment:        The GeneXpert MRSA Assay (FDA approved for NASAL specimens only), is one component of a comprehensive MRSA colonization surveillance program. It is not intended to diagnose MRSA infection nor to guide or monitor treatment for MRSA infections. Performed at [facility], [address], [place], [place] [address]          Radiology Studies: No results found.      Scheduled Meds: . carvedilol  6.25 mg Oral BID WC  . feeding supplement (ENSURE ENLIVE)  237 mL Oral Q24H  . insulin aspart  0-9 Units Subcutaneous Q4H  . isosorbide-hydrALAZINE  1 tablet Oral TID  . pravastatin  80 mg Oral BH-q7a  . sodium bicarbonate  650 mg Oral BID  . sodium chloride flush  3 mL Intravenous Q12H   Continuous Infusions:   LOS: 5 days    Time spent: 30 mins.     Burnadette Pop, MD [facility] Pager [phone]  If 7PM-7AM, please contact night-coverage [email or web address] Password [facility] 01/26/2018, 10:22 AM

## 2018-01-27 LAB — GLUCOSE, CAPILLARY
GLUCOSE-CAPILLARY: 243 mg/dL — AB (ref 65–99)
Glucose-Capillary: 146 mg/dL — ABNORMAL HIGH (ref 65–99)
Glucose-Capillary: 231 mg/dL — ABNORMAL HIGH (ref 65–99)

## 2018-02-26 NOTE — Discharge Summary (Signed)
Physician Discharge Summary  Tammy Huff ZOX:[id number] DOB: [birth year] DOA: 01/21/2018  PCP: Massie Maroon, FNP  Admit date: 01/21/2018 Discharge date: February 05, 2018  Admitted From: Home Disposition:  Residential Hospice   Discharge Condition:Hospice CODE STATUS:DNR Diet recommendation: Heart Healthy  Brief/Interim Summary: Patient is a 77 year old female with past medical history of advanced Alzheimer's dementia, diverticulosis, type 2 diabetes mellitus, stage III CKD, hypertension who was brought to the emergency department for multiple complaints. Patient was found to be confused on presentation.  Patient was also found to have elevated troponin, worsening kidney function, hyperglycemia with elevated anion gap.  Also found to have urinary tract infectinon .Her FOBT was also positive.  GI and cardiology were consulted. Her creatinine got progressively  worse so nephrology also consulted.No plan for dialysis.  Palliative care was also involved .  Family meeting held and patient was made DNR/DNR.  Patient continued to have poor oral intake.  Kidney function progressively deteriorated. Patient is finally being transferred to residential hospice.   Following problems were addressed during her hopitalization:  Acute metabolic encephalopathy: Probably secondary to urinary tract infection, uremia.  Patient also has underlying advanced dementia with behavioral disturbance.   No focal neurological deficits.She remains confused this morning.  Acute kidney injury on CKD stage 4: In the  setting of poor oral intake,UTI, CHF.  Patient was also on ARB at home.  On reviewing her records, her last serum creatinine was 3.8 on 12/29/16   at outside facility. Kidney function deteriorating. Nephrology evaluation done. Ultrasound of the kidneys are unremarkable.  Patient also has Foley catheter.  Has poor urinary output.  She was treated with IV Lasix and metolazone which have been stopped. Palliative  care also following.   Also started on sodium bicarb tablets. Patient is not a dialysis candidate. Since her kidney function is worsening and patient continues to have poor oral intake, will discontinue Lasix.  Elevated troponin/non-STEMI: Cardiology was following.  Suspect supply-demand ischemia , likely associated with AK I, CHF.  Cardiology recommended  medical management.  Cardiology signed off. Patient has EF of 20% which is new .Also has elevated troponin and lateral T wave changes in EKG. BNP of more than 4500 and chest x-ray with bilateral small pleural effusions. Recommended palliative care and supportive care. Unable to anticoagulate because of fecal occult blood test positive.   Acute lower UTI: Urine culture negative.  Treated with 3 days of ceftriaxone.  Essential hypertension:   Currently blood pressure stable.    History of Diabetes mellitus: Mild DKA on admission.  DKA resolved.  Lantus discontinued due to hypoglycemia. Was on  sliding-scale insulin  Normocytic anemia: Fecal occult blood test was positive.  Unknown if patient has GI bleed.  GI evaluated the patient and recommended conservative management .Was on Protonix.   Deconditioning/debility/poor prognosis: Palliative care following.We discussed with  daughter regarding her worsening kidney function.  Patient is DNR/DNI .Plan is to discharge her to residential hospice.     Discharge Diagnoses:  Principal Problem:   Acute encephalopathy Active Problems:   DM hyperosmolarity type II, uncontrolled (HCC)   Essential hypertension   Dehydration   Renal failure, acute on chronic (HCC)   Dementia with behavioral disturbance   GI bleeding   Hypoxia   Acute lower UTI   DKA (diabetic ketoacidosis) (HCC)   NSTEMI (non-ST elevated myocardial infarction) (HCC)   Anemia   Goals of care, counseling/discussion   Palliative care by specialist   Advance care planning   Acute  systolic CHF (congestive heart failure)  [facility])    Discharge Instructions  Discharge Instructions    Discharge instructions   Complete by:  As directed    Follow up with hospice services     Allergies as of 2018-02-10   No Known Allergies     Medication List    TAKE these medications   amLODipine 10 MG tablet Commonly known as:  NORVASC Take 10 mg by mouth every evening.   aspirin 325 MG EC tablet Take 1 tablet (325 mg total) by mouth daily.   carvedilol 6.25 MG tablet Commonly known as:  COREG Take 1 tablet (6.25 mg total) by mouth 2 (two) times daily with a meal. Reported on 12/26/2015   cloNIDine 0.2 MG tablet Commonly known as:  CATAPRES Take 1 tablet (0.2 mg total) by mouth 2 (two) times daily.   diphenhydrAMINE 25 MG tablet Commonly known as:  BENADRYL Take 25 mg by mouth daily as needed for allergies.   famotidine 10 MG chewable tablet Commonly known as:  PEPCID AC Chew 10 mg by mouth every morning.   GERITOL PO Take 1 tablet by mouth every morning.   hydrALAZINE 25 MG tablet Commonly known as:  APRESOLINE Take 25 mg by mouth 3 (three) times daily.   insulin aspart 100 UNIT/ML FlexPen Commonly known as:  NOVOLOG FLEXPEN Inject 4 Units into the skin 3 (three) times daily with meals.   Insulin Glargine 100 UNIT/ML Solostar Pen Commonly known as:  LANTUS SOLOSTAR Inject 40 Units into the skin daily at 10 pm.   Insulin Pen Needle 31G X 8 MM Misc Commonly known as:  RELION PEN NEEDLE 31G/8MM Use as directed.   losartan-hydrochlorothiazide 100-25 MG tablet Commonly known as:  HYZAAR Take 1 tablet by mouth daily.   pravastatin 80 MG tablet Commonly known as:  PRAVACHOL Take 1 tablet (80 mg total) by mouth every morning.   TRULICITY 1.5 MG/0.5ML Sopn Generic drug:  Dulaglutide Inject 0.5 mLs as directed once a week.       No Known Allergies  Consultations:  Cardiology, GI, nephrology, palliative care   Procedures/Studies: Ct Abdomen Pelvis Wo Contrast  Result Date:  01/21/2018 CLINICAL DATA:  Hyperglycemia, generalize weakness and nausea EXAM: CT ABDOMEN AND PELVIS WITHOUT CONTRAST TECHNIQUE: Multidetector CT imaging of the abdomen and pelvis was performed following the standard protocol without IV contrast. COMPARISON:  None. FINDINGS: Lower chest: Small bilateral pleural effusions with bibasilar atelectasis. Stable cardiomegaly. Bilateral interstitial prominence. Hepatobiliary: No focal liver abnormality is seen. No gallstones, gallbladder wall thickening, or biliary dilatation. Pancreas: Unremarkable. No pancreatic ductal dilatation or surrounding inflammatory changes. Spleen: Normal in size without focal abnormality. Adrenals/Urinary Tract: Adrenal glands are unremarkable. 18 mm hypodense, fluid attenuating left renal mass most consistent with a cyst. No urolithiasis or obstructive uropathy. Relatively decompressed bladder. Stomach/Bowel: Stomach is within normal limits. No evidence of bowel wall thickening, distention, or inflammatory changes. Vascular/Lymphatic: Abdominal aortic atherosclerosis. Normal caliber abdominal aorta. No lymphadenopathy. Reproductive: Uterus and bilateral adnexa are unremarkable. Other: No abdominal wall hernia or abnormality. Trace perihepatic ascites along the inferior margin. Musculoskeletal: No acute osseous abnormality. No aggressive osseous lesion. Degenerative disc disease disc height loss at T12-L1 and L2-3. IMPRESSION: 1. No acute abdominal or pelvic pathology. 2. Small bilateral pleural effusions and mild interstitial edema. Electronically Signed   By: Elige Ko   On: 01/21/2018 13:53   Dg Chest 2 View  Result Date: 01/21/2018 CLINICAL DATA:  Generalized weakness.  Shortness of breath. EXAM:  CHEST - 2 VIEW COMPARISON:  Chest x-ray dated Mar 14, 2016. FINDINGS: Borderline cardiomegaly. Mild pulmonary vascular congestion. New small bilateral pleural effusions with bibasilar airspace opacities. No pneumothorax. No acute osseous  abnormality. IMPRESSION: 1. New small bilateral pleural effusions with bibasilar atelectasis versus infiltrates. Electronically Signed   By: Obie Dredge M.D.   On: 01/21/2018 10:46   US Renal  Result Date: 01/22/2018 CLINICAL DATA:  Acute kidney injury. EXAM: RENAL / URINARY TRACT ULTRASOUND COMPLETE COMPARISON:  Noncontrast CT 01/21/2018. FINDINGS: Right Kidney: Length: 9.3 cm. Echogenicity within normal limits. No solid mass or hydronephrosis visualized. Approximately 1 cm sized cystic lesions are seen in the midpole and superior pole. Left Kidney: Length: 8.7 cm. Echogenicity within normal limits. No mass or hydronephrosis visualized. Hypoechoic lesion LEFT mid pole, 2.4 x 2.1 x 1.9 cm, also likely represents a simple cyst, and correlates with the hypodense abnormality noted on prior CT. Bladder: Foley catheter is in place. IMPRESSION: Normal renal size. No hydronephrosis or calculi. Renal cystic disease. Electronically Signed   By: Elsie Stain M.D.   On: 01/22/2018 15:21    (Echo, Carotid, EGD, Colonoscopy, ERCP)    Subjective: Patient seen and examined at bedside this morning.  Remains confused.  Continues to have poor oral intake.  Hemodynamically stable.  Discharge Exam: Vitals:   2018/02/13 0859 2018/02/13 1232  BP: 131/64 (!) 103/46  Pulse: 80 72  Resp:  18  Temp:  98.7 F (37.1 C)  SpO2:  98%   Vitals:   01/26/18 2105 02/13/2018 0422 02-13-2018 0859 02-13-2018 1232  BP: 114/61 117/76 131/64 (!) 103/46  Pulse: 68 80 80 72  Resp: 18 18  18   Temp: 98.4 F (36.9 C) 98.1 F (36.7 C)  98.7 F (37.1 C)  TempSrc: Oral Oral  Oral  SpO2: 97% 100%  98%  Weight:  70.4 kg (155 lb 3.3 oz)    Height:        General: Pt is not alert or awake, not in acute distress Cardiovascular: RRR, S1/S2 +, no rubs, no gallops Respiratory: CTA bilaterally, no wheezing, no rhonchi Abdominal: Soft, NT, ND, bowel sounds + Extremities: no edema, no cyanosis    The results of significant diagnostics  from this hospitalization (including imaging, microbiology, ancillary and laboratory) are listed below for reference.     Microbiology: Recent Results (from the past 240 hour(s))  Culture, Urine     Status: None   Collection Time: 01/21/18 12:52 PM  Result Value Ref Range Status   Specimen Description   Final    URINE, RANDOM Performed at [facility], [address] [place], [place] [address]    Special Requests   Final    NONE Performed at [facility], [address], [place], [place] [address]    Culture   Final    NO GROWTH Performed at [facility], [address] [place], [place] [address]    Report Status 01/23/2018 FINAL  Final  MRSA PCR Screening     Status: None   Collection Time: 01/21/18  9:15 PM  Result Value Ref Range Status   MRSA by PCR NEGATIVE NEGATIVE Final    Comment:        The GeneXpert MRSA Assay (FDA approved for NASAL specimens only), is one component of a comprehensive MRSA colonization surveillance program. It is not intended to diagnose MRSA infection nor to guide or monitor treatment for MRSA infections. Performed at [facility],  [address], [place], [place] [address]   Gastrointestinal Panel by PCR , Stool     Status: None   Collection Time: 01/25/18  9:05 AM  Result Value Ref Range Status   Campylobacter species NOT DETECTED NOT DETECTED Final   Plesimonas shigelloides NOT DETECTED NOT DETECTED Final   Salmonella species NOT DETECTED NOT DETECTED Final   Yersinia enterocolitica NOT DETECTED NOT DETECTED Final   Vibrio species NOT DETECTED NOT DETECTED Final   Vibrio cholerae NOT DETECTED NOT DETECTED Final   Enteroaggregative E coli (EAEC) NOT DETECTED NOT DETECTED Final   Enteropathogenic E coli (EPEC) NOT DETECTED NOT DETECTED Final   Enterotoxigenic E coli (ETEC) NOT DETECTED NOT DETECTED Final   Shiga like toxin producing E coli (STEC) NOT DETECTED NOT DETECTED  Final   Shigella/Enteroinvasive E coli (EIEC) NOT DETECTED NOT DETECTED Final   Cryptosporidium NOT DETECTED NOT DETECTED Final   Cyclospora cayetanensis NOT DETECTED NOT DETECTED Final   Entamoeba histolytica NOT DETECTED NOT DETECTED Final   Giardia lamblia NOT DETECTED NOT DETECTED Final   Adenovirus F40/41 NOT DETECTED NOT DETECTED Final   Astrovirus NOT DETECTED NOT DETECTED Final   Norovirus GI/GII NOT DETECTED NOT DETECTED Final   Rotavirus A NOT DETECTED NOT DETECTED Final   Sapovirus (I, II, IV, and V) NOT DETECTED NOT DETECTED Final    Comment: Performed at [facility], [address] [place], [place] [address]     Labs: BNP (last 3 results) Recent Labs    01/22/18 0325  BNP >4,500.0*   Basic Metabolic Panel: Recent Labs  Lab 01/23/18 0350 01/23/18 2142 01/24/18 0310 01/25/18 0339 01/26/18 0405  NA 140 137 138 136 137  K 3.7 3.3* 3.3* 3.5 3.4*  CL 108 106 106 103 103  CO2 21* 21* 20* 19* 22  GLUCOSE 101* 163* 96 115* 123*  BUN 56* 60* 63* 63* 65*  CREATININE 6.06* 6.51* 6.73* 7.20* 7.48*  CALCIUM 7.7* 7.8* 7.8* 7.8* 7.7*  MG 2.0 2.1 2.1  --   --   PHOS 3.6  --  4.1 4.3  --    Liver Function Tests: Recent Labs  Lab 01/21/18 1111 01/23/18 0350 01/23/18 2142 01/24/18 0310 01/25/18 0339  AST 74*  --  75*  --   --   ALT 49  --  75*  --   --   ALKPHOS 111  --  92  --   --   BILITOT 1.8*  --  0.8  --   --   PROT 7.4  --  6.0*  --   --   ALBUMIN 3.8 2.6* 2.8* 2.6* 2.8*   No results for input(s): LIPASE, AMYLASE in the last 168 hours. No results for input(s): AMMONIA in the last 168 hours. CBC: Recent Labs  Lab 01/21/18 1111  01/21/18 2347 01/22/18 0325 01/23/18 0350 01/24/18 0310 01/25/18 0339  WBC 7.0  --   --  8.6 7.6 7.6 7.1  NEUTROABS 5.5  --   --   --   --   --   --   HGB 8.6*   < > 8.5* 8.6* 8.4* 8.7* 8.7*  HCT 25.6*   < > 25.2* 25.0* 24.8* 25.9* 26.3*  MCV 89.2  --   --  89.0 89.9 90.6 89.5  PLT 299  --   --  226 216 206 216    < > = values in this interval not displayed.   Cardiac Enzymes: Recent Labs  Lab 01/21/18  1111 01/21/18 1726 01/21/18 2124 01/22/18 0325 01/22/18 0854  TROPONINI 0.19* 0.21* 0.21* 0.19* 0.19*   BNP: Invalid input(s): POCBNP CBG: Recent Labs  Lab 01/26/18 1154 01/26/18 1726 01/26/18 2109 September 21, 2018 0731 September 21, 2018 1151  GLUCAP 123* 210* 191* 146* 231*   D-Dimer No results for input(s): DDIMER in the last 72 hours. Hgb A1c No results for input(s): HGBA1C in the last 72 hours. Lipid Profile No results for input(s): CHOL, HDL, LDLCALC, TRIG, CHOLHDL, LDLDIRECT in the last 72 hours. Thyroid function studies No results for input(s): TSH, T4TOTAL, T3FREE, THYROIDAB in the last 72 hours.  Invalid input(s): FREET3 Anemia work up No results for input(s): VITAMINB12, FOLATE, FERRITIN, TIBC, IRON, RETICCTPCT in the last 72 hours. Urinalysis    Component Value Date/Time   COLORURINE YELLOW 01/21/2018 1252   APPEARANCEUR CLEAR 01/21/2018 1252   LABSPEC 1.012 01/21/2018 1252   PHURINE 5.0 01/21/2018 1252   GLUCOSEU >=500 (A) 01/21/2018 1252   HGBUR MODERATE (A) 01/21/2018 1252   BILIRUBINUR NEGATIVE 01/21/2018 1252   KETONESUR 5 (A) 01/21/2018 1252   PROTEINUR >=300 (A) 01/21/2018 1252   UROBILINOGEN 0.2 12/26/2015 1530   NITRITE NEGATIVE 01/21/2018 1252   LEUKOCYTESUR TRACE (A) 01/21/2018 1252   Sepsis Labs Invalid input(s): PROCALCITONIN,  WBC,  LACTICIDVEN Microbiology Recent Results (from the past 240 hour(s))  Culture, Urine     Status: None   Collection Time: 01/21/18 12:52 PM  Result Value Ref Range Status   Specimen Description   Final    URINE, RANDOM Performed at [facility], [address] [place], [place] [address]    Special Requests   Final    NONE Performed at [facility], [address] [place], [place] [address]    Culture   Final    NO GROWTH Performed at [facility], [address]  [place], [place] [address]    Report Status 01/23/2018 FINAL  Final  MRSA PCR Screening     Status: None   Collection Time: 01/21/18  9:15 PM  Result Value Ref Range Status   MRSA by PCR NEGATIVE NEGATIVE Final    Comment:        The GeneXpert MRSA Assay (FDA approved for NASAL specimens only), is one component of a comprehensive MRSA colonization surveillance program. It is not intended to diagnose MRSA infection nor to guide or monitor treatment for MRSA infections. Performed at [facility], [address] [place], [place] [address]   Gastrointestinal Panel by PCR , Stool     Status: None   Collection Time: 01/25/18  9:05 AM  Result Value Ref Range Status   Campylobacter species NOT DETECTED NOT DETECTED Final   Plesimonas shigelloides NOT DETECTED NOT DETECTED Final   Salmonella species NOT DETECTED NOT DETECTED Final   Yersinia enterocolitica NOT DETECTED NOT DETECTED Final   Vibrio species NOT DETECTED NOT DETECTED Final   Vibrio cholerae NOT DETECTED NOT DETECTED Final   Enteroaggregative E coli (EAEC) NOT DETECTED NOT DETECTED Final   Enteropathogenic E coli (EPEC) NOT DETECTED NOT DETECTED Final   Enterotoxigenic E coli (ETEC) NOT DETECTED NOT DETECTED Final   Shiga like toxin producing E coli (STEC) NOT DETECTED NOT DETECTED Final   Shigella/Enteroinvasive E coli (EIEC) NOT DETECTED NOT DETECTED Final   Cryptosporidium NOT DETECTED NOT DETECTED Final   Cyclospora cayetanensis NOT DETECTED NOT DETECTED Final   Entamoeba histolytica NOT DETECTED NOT DETECTED Final   Giardia lamblia NOT DETECTED NOT  DETECTED Final   Adenovirus F40/41 NOT DETECTED NOT DETECTED Final   Astrovirus NOT DETECTED NOT DETECTED Final   Norovirus GI/GII NOT DETECTED NOT DETECTED Final   Rotavirus A NOT DETECTED NOT DETECTED Final   Sapovirus (I, II, IV, and V) NOT DETECTED NOT DETECTED Final    Comment: Performed at [facility], [address] [place], [place]  [address]     Time coordinating discharge: Over 30 minutes  SIGNED:   Burnadette Pop, MD  [facility] 16-Feb-2018, 1:57 PM Pager [phone]  If 7PM-7AM, please contact night-coverage [email or web address] Password [email or web address]

## 2018-02-26 NOTE — Progress Notes (Signed)
CSW following to assist patient with discharge to residential hospice. Per chart review, patient's daughter/HCPOA planned [facility]. Patient alert and oriented x person.   CSW contacted patient's daughter Tammy Huff(Tammy Huff [phone]) to complete assessment and confirm plan, no answer. CSW left voicemail requesting return phone call. CSW contacted additional listed work number [phone]) for patient's daughter, staff reported that patient's daughter has not arrived yet.  CS[facility] lia[facility] and informed that [facility]. [facility] liaison reported that she has not been able to reach patient's family. CSW agreed to continue to try and reach patient's daughter.  CSW contacted patient's son Tammy Huff(Tammy Huff [phone]), no answer. CSW left voicemail requesting return phone call.  CSW will continue to try and reach patient's family.  [facility], no family has been able to be reached to do paperwork. Patient's family will need to complete paperwork before patient is able to be transferred.  Celso SickleKimberly Marge Vandermeulen, [place] Clinical Social Worker [facility] Cell#: [phone]

## 2018-02-26 NOTE — Progress Notes (Signed)
[facility], patient alert and oriented to person, in no pain/distress. No open wound noted. [facility]. Discharge packet prepared by CSW and given to EMS for facility.

## 2018-02-26 NOTE — Progress Notes (Signed)
[facility].  CSW informed by [facility](Maryanne) that patient's paperwork was complete and that [facility] can [facility].  CS[facility].   PT[facility] contacted, patient's family Tammy Huff([name] Fishman) notified. Patient's RN can call report to [phone], packet complete. CSW signing off, no other needs identified at this time.  Celso SickleKimberly Keeley Sussman, [place] Clinical Social Worker [facility] Cell#: [phone]

## 2018-02-26 NOTE — Care Management Important Message (Addendum)
Important Message  Patient Details IM Letter given to Cookie/Case Manager to present to the Patient Name: Tammy Huff MRN: [id number] Date of Birth: 06/22/1941   Medicare Important Message Given:  Yes    Caren MacadamFuller, Shizue Kaseman 02/04/2018, 11:46 AMImportant Message  Patient Details  Name: Tammy KalataGracie C Huff MRN: [id number] Date of Birth: 01/06/1941   Medicare Important Message Given:  Yes    Caren MacadamFuller, Infinity Jeffords 02/15/2018, 11:46 AM

## 2018-02-26 NOTE — Progress Notes (Signed)
CSW contacted patient's daughter Jeronimo Greaves(Lousie Brener [phone]). Patient's daughter answered and confirmed plan for [facility] to [facility]. CSW informed patient's daughter that [facility] liaison was trying to reach her to complete paperwork. Patient's daughter reported that she was currently driving and that she can be reached by phone, CSW agreed to update [facility] liaison.   CSW updated [facility] liaison that patient's daughter is currently available by phone. [facility] liaison agreed to follow up with patient's daughter.   CSW informed by [facility] that patient's daughter was reached and that she will be filling out patient's paperwork. [facility] liaison reported that she will contact CSW when paperwork is completed and patient can be transferred.   CSW will continue to follow and assist with discharge planning.   Celso SickleKimberly Angelique Chevalier, [place] Clinical Social Worker [facility] Cell#: [phone]

## 2018-02-26 NOTE — Progress Notes (Signed)
[facility] Liaison: RN note  Received request from CSW for [facility] with request for transfer today. Chart reviewed and patient approved. Spoke with daughter, Danie BinderLouise Cyran by telephone to confirm interest and explain services. Family agreeable to transfer today. Registration paperwork faxed from referral center and completed and [facility] by daughter. Dr. Kern Reaponald Hertweck to assume care per family request. Please fax discharge summary to [phone]. RN please call report to [phone]. Please arrange transportation for patient to arrive as soon as possible.   Thank you for this referral.  Elsie SaasMary Anne Robertson, [facility],CCM [facility] Liaison [phone]  [facility] liaisons are on AMION

## 2018-02-26 NOTE — Progress Notes (Signed)
   05/30/2018 1443  Clinical Encounter Type  Visited With Patient  Visit Type Initial  Spiritual Encounters  Spiritual Needs Prayer   Rounding on Palliative Patients.  She was sitting up in bed and very talkative and tracked part of the conversation but not all.  Stated she is feeling ok and had a big smile on her face.  Did not elaborate when asked about her family.  Said she is glad to be where she is an went on and on about the good care she has received.  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  We prayed together.  Will follow and support as needed. Chaplain Agustin CreeNewton Liddie Chichester

## 2018-02-26 DEATH — deceased

## 2020-01-14 IMAGING — CT CT ABD-PELV W/O CM
2 of 5 series · 16 of 46 positions shown, 18 images · non-contrast
Comparison: None.

CLINICAL DATA: Hyperglycemia, generalize weakness and nausea

EXAM:
CT ABDOMEN AND PELVIS WITHOUT CONTRAST
TECHNIQUE: Multidetector CT imaging of the abdomen and pelvis was performed
following the standard protocol without IV contrast.

[Series 2: axial st · axial · 0.83mm/px · z∈[+966,+1356]mm · 13 of 92 slices shown, 15 images]
[im 7/92  soft-tissue]
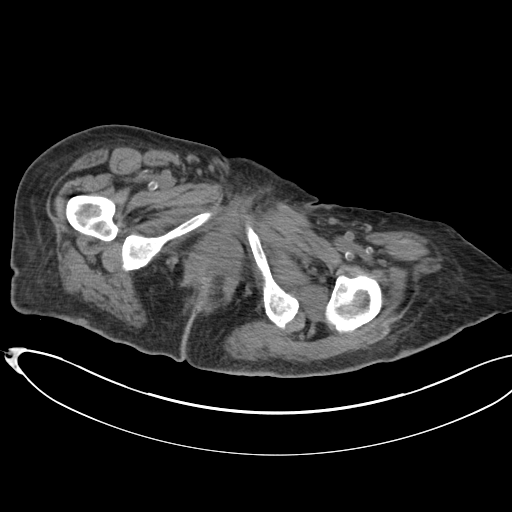
[im 7/92  bone]
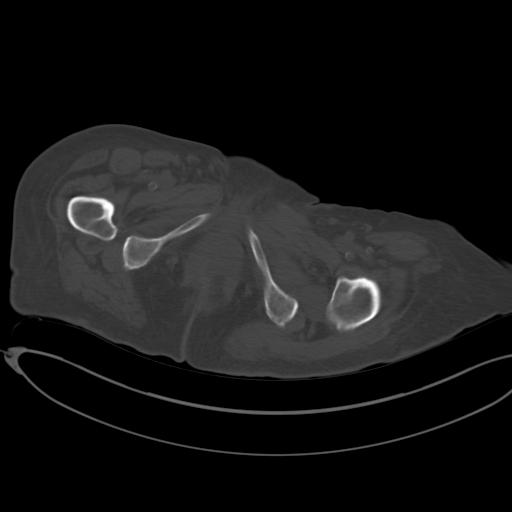
[im 14/92  soft-tissue]
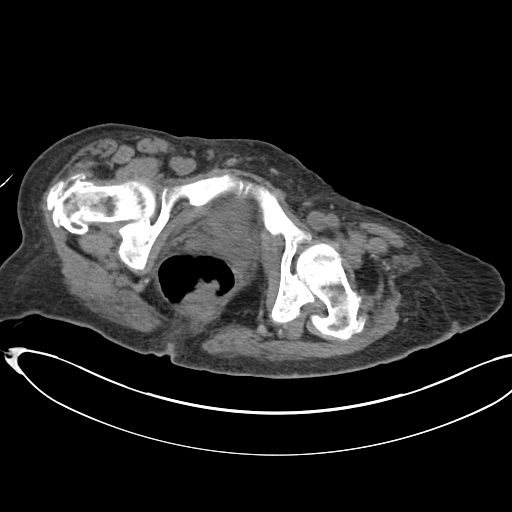
[im 20/92  soft-tissue]
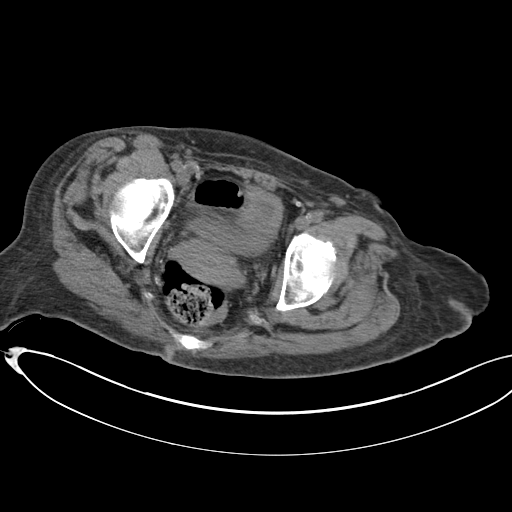
[im 27/92  soft-tissue]
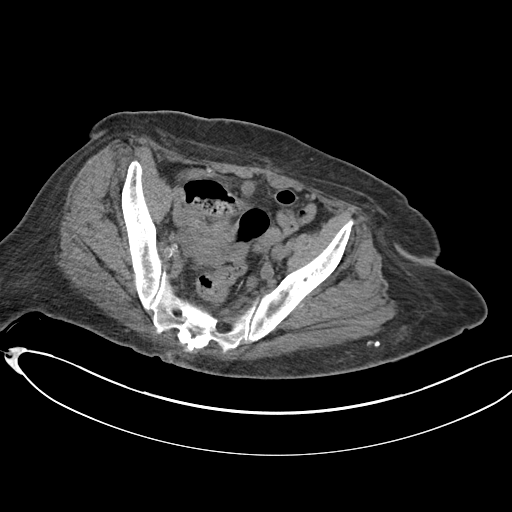
[im 33/92  soft-tissue]
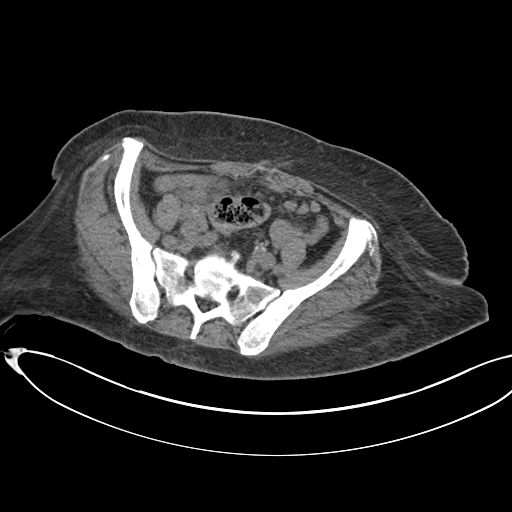
[im 40/92  soft-tissue]
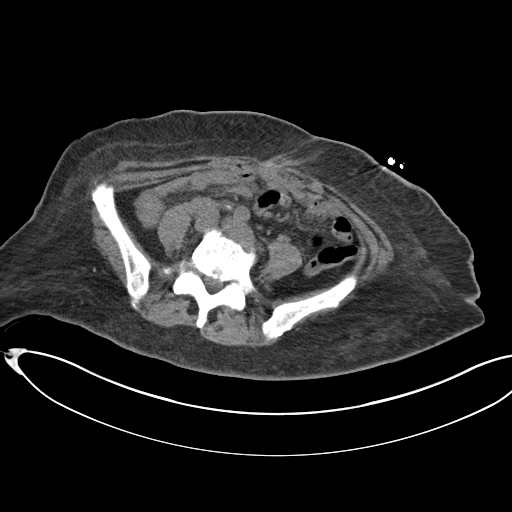
[im 46/92  soft-tissue]
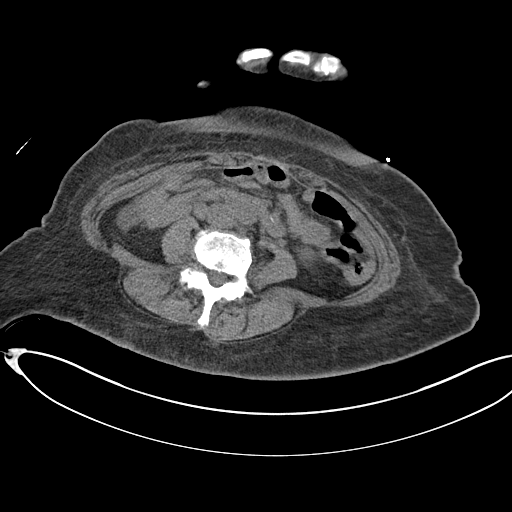
[im 53/92  soft-tissue]
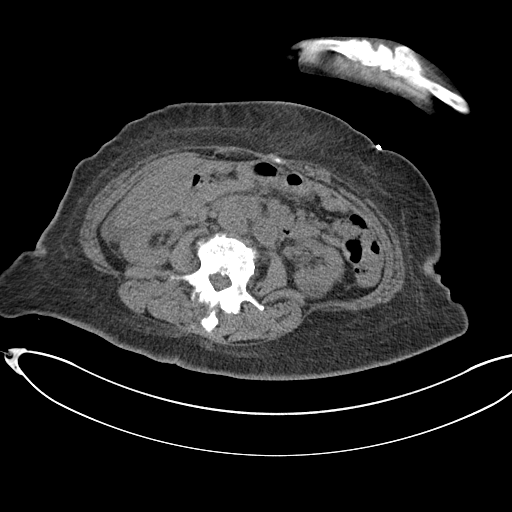
[im 59/92  soft-tissue]
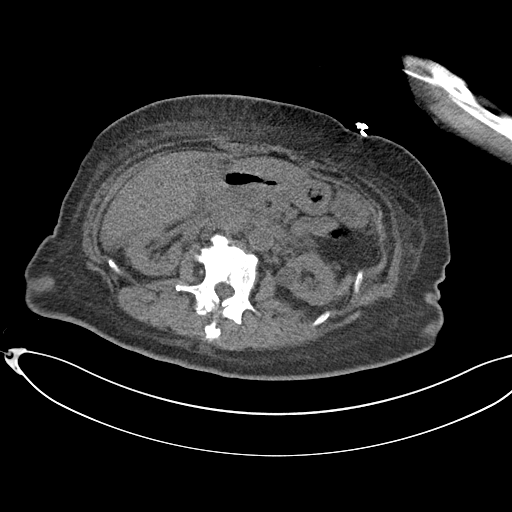
[im 59/92  bone]
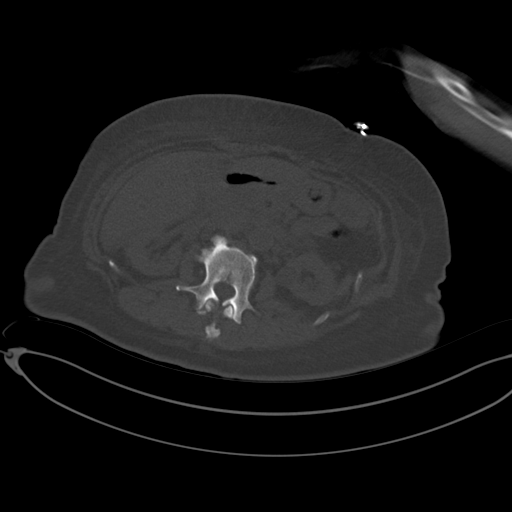
[im 66/92  soft-tissue]
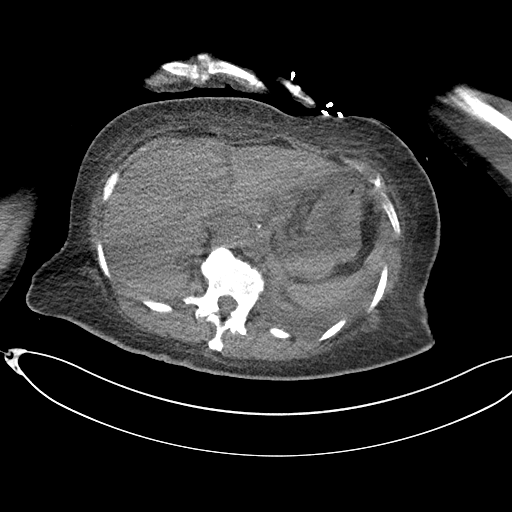
[im 72/92  soft-tissue]
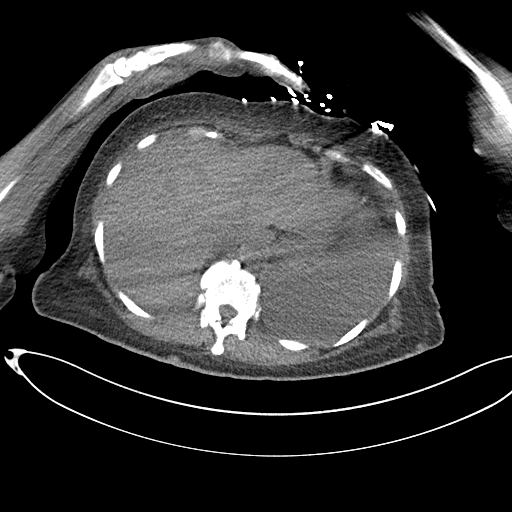
[im 79/92  soft-tissue]
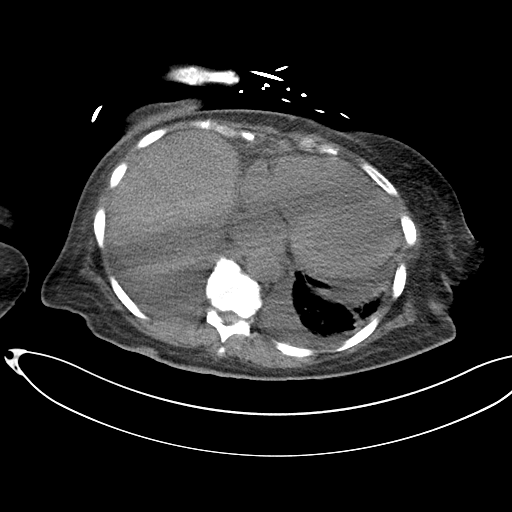
[im 85/92  soft-tissue]
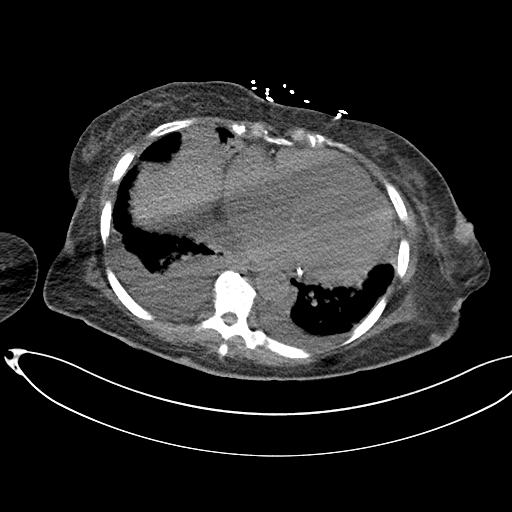

[Series 5: coronal st · coronal · 0.88mm/px · 3 of 100 slices shown]
[im 34/100  soft-tissue]
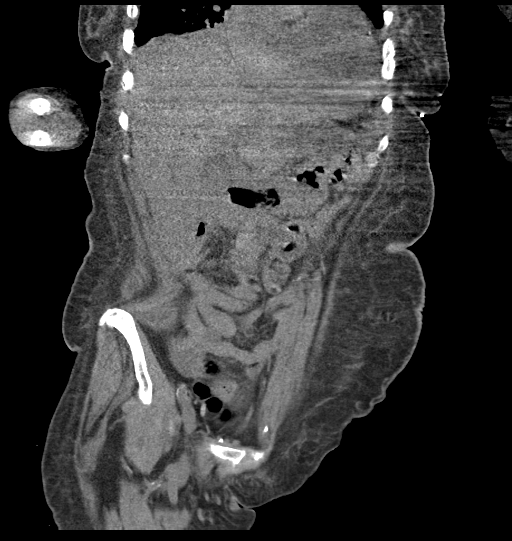
[im 45/100  soft-tissue]
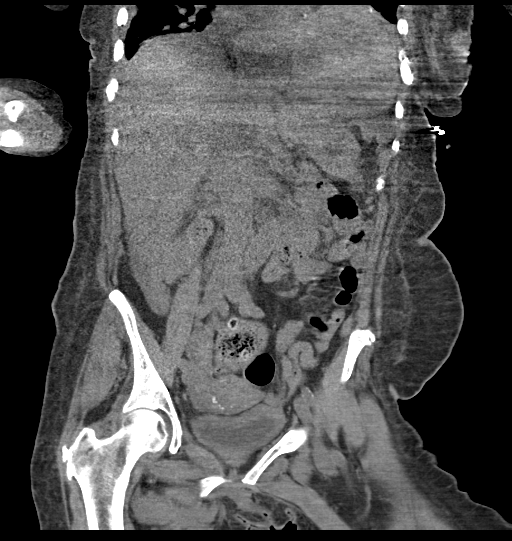
[im 56/100  soft-tissue]
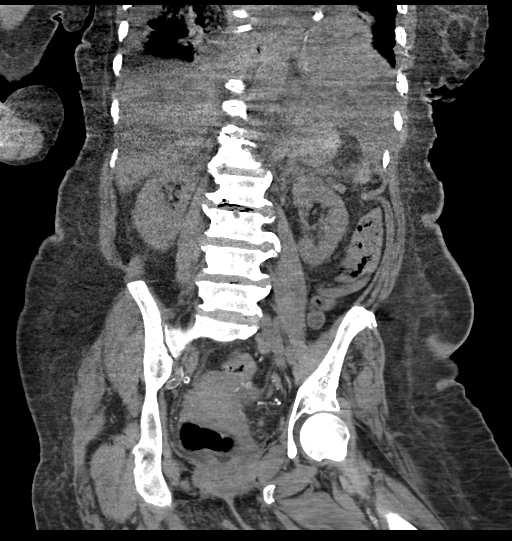

[16 of 46 positions shown; findings below may reference images not displayed]

FINDINGS: Lower chest: Small bilateral pleural effusions with bibasilar
atelectasis. Stable cardiomegaly. Bilateral interstitial prominence.

Hepatobiliary: No focal liver abnormality is seen. No gallstones,
gallbladder wall thickening, or biliary dilatation.

Pancreas: Unremarkable. No pancreatic ductal dilatation or
surrounding inflammatory changes.

Spleen: Normal in size without focal abnormality.

Adrenals/Urinary Tract: Adrenal glands are unremarkable. 18 mm
hypodense, fluid attenuating left renal mass most consistent with a
cyst. No urolithiasis or obstructive uropathy. Relatively
decompressed bladder.

Stomach/Bowel: Stomach is within normal limits. No evidence of bowel
wall thickening, distention, or inflammatory changes.

Vascular/Lymphatic: Abdominal aortic atherosclerosis. Normal caliber
abdominal aorta. No lymphadenopathy.

Reproductive: Uterus and bilateral adnexa are unremarkable.

Other: No abdominal wall hernia or abnormality. Trace perihepatic
ascites along the inferior margin.

Musculoskeletal: No acute osseous abnormality. No aggressive osseous
lesion. Degenerative disc disease disc height loss at T12-L1 and
L2-3.
IMPRESSION: 1. No acute abdominal or pelvic pathology.
2. Small bilateral pleural effusions and mild interstitial edema.
